# Patient Record
Sex: Male | Born: 1965 | Race: White | Hispanic: No | Marital: Single | Smoking: Current every day smoker
Health system: Southern US, Community
[De-identification: ages and names within clinical notes are randomized; demographics above are authoritative.]

## PROBLEM LIST (undated history)

## (undated) DIAGNOSIS — I2510 Atherosclerotic heart disease of native coronary artery without angina pectoris: Secondary | ICD-10-CM

## (undated) DIAGNOSIS — J45909 Unspecified asthma, uncomplicated: Secondary | ICD-10-CM

## (undated) DIAGNOSIS — K7030 Alcoholic cirrhosis of liver without ascites: Secondary | ICD-10-CM

## (undated) DIAGNOSIS — K449 Diaphragmatic hernia without obstruction or gangrene: Secondary | ICD-10-CM

## (undated) DIAGNOSIS — I10 Essential (primary) hypertension: Secondary | ICD-10-CM

## (undated) DIAGNOSIS — K2970 Gastritis, unspecified, without bleeding: Secondary | ICD-10-CM

## (undated) DIAGNOSIS — K635 Polyp of colon: Secondary | ICD-10-CM

## (undated) DIAGNOSIS — J449 Chronic obstructive pulmonary disease, unspecified: Secondary | ICD-10-CM

## (undated) DIAGNOSIS — S3991XA Unspecified injury of abdomen, initial encounter: Secondary | ICD-10-CM

## (undated) DIAGNOSIS — R768 Other specified abnormal immunological findings in serum: Secondary | ICD-10-CM

## (undated) DIAGNOSIS — Z9081 Acquired absence of spleen: Secondary | ICD-10-CM

## (undated) HISTORY — PX: TONSILLECTOMY: SUR1361

---
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
## 2014-04-15 ENCOUNTER — Emergency Department (HOSPITAL_COMMUNITY): Payer: Self-pay

## 2014-04-15 ENCOUNTER — Encounter (HOSPITAL_COMMUNITY): Payer: Self-pay | Admitting: Emergency Medicine

## 2014-04-15 ENCOUNTER — Inpatient Hospital Stay (HOSPITAL_COMMUNITY)
Admission: EM | Admit: 2014-04-15 | Discharge: 2014-04-22 | DRG: 375 | Disposition: A | Discharge status: Home Health | Payer: Self-pay | Attending: Internal Medicine | Admitting: Internal Medicine

## 2014-04-15 DIAGNOSIS — T380X5A Adverse effect of glucocorticoids and synthetic analogues, initial encounter: Secondary | ICD-10-CM | POA: Diagnosis present

## 2014-04-15 DIAGNOSIS — J441 Chronic obstructive pulmonary disease with (acute) exacerbation: Secondary | ICD-10-CM | POA: Diagnosis present

## 2014-04-15 DIAGNOSIS — C801 Malignant (primary) neoplasm, unspecified: Secondary | ICD-10-CM | POA: Diagnosis present

## 2014-04-15 DIAGNOSIS — K2990 Gastroduodenitis, unspecified, without bleeding: Secondary | ICD-10-CM

## 2014-04-15 DIAGNOSIS — F1020 Alcohol dependence, uncomplicated: Secondary | ICD-10-CM | POA: Diagnosis present

## 2014-04-15 DIAGNOSIS — Z880 Allergy status to penicillin: Secondary | ICD-10-CM

## 2014-04-15 DIAGNOSIS — Z800 Family history of malignant neoplasm of digestive organs: Secondary | ICD-10-CM

## 2014-04-15 DIAGNOSIS — R109 Unspecified abdominal pain: Secondary | ICD-10-CM

## 2014-04-15 DIAGNOSIS — K7030 Alcoholic cirrhosis of liver without ascites: Secondary | ICD-10-CM | POA: Diagnosis present

## 2014-04-15 DIAGNOSIS — K669 Disorder of peritoneum, unspecified: Secondary | ICD-10-CM | POA: Insufficient documentation

## 2014-04-15 DIAGNOSIS — R7309 Other abnormal glucose: Secondary | ICD-10-CM | POA: Diagnosis present

## 2014-04-15 DIAGNOSIS — K2970 Gastritis, unspecified, without bleeding: Secondary | ICD-10-CM | POA: Diagnosis present

## 2014-04-15 DIAGNOSIS — J449 Chronic obstructive pulmonary disease, unspecified: Secondary | ICD-10-CM

## 2014-04-15 DIAGNOSIS — R1013 Epigastric pain: Secondary | ICD-10-CM | POA: Diagnosis present

## 2014-04-15 DIAGNOSIS — F1011 Alcohol abuse, in remission: Secondary | ICD-10-CM | POA: Diagnosis present

## 2014-04-15 DIAGNOSIS — Z823 Family history of stroke: Secondary | ICD-10-CM

## 2014-04-15 DIAGNOSIS — D72829 Elevated white blood cell count, unspecified: Secondary | ICD-10-CM | POA: Diagnosis present

## 2014-04-15 DIAGNOSIS — Z720 Tobacco use: Secondary | ICD-10-CM | POA: Diagnosis present

## 2014-04-15 DIAGNOSIS — R7401 Elevation of levels of liver transaminase levels: Secondary | ICD-10-CM | POA: Diagnosis present

## 2014-04-15 DIAGNOSIS — K449 Diaphragmatic hernia without obstruction or gangrene: Secondary | ICD-10-CM | POA: Diagnosis present

## 2014-04-15 DIAGNOSIS — R7402 Elevation of levels of lactic acid dehydrogenase (LDH): Secondary | ICD-10-CM | POA: Diagnosis present

## 2014-04-15 DIAGNOSIS — K5900 Constipation, unspecified: Secondary | ICD-10-CM | POA: Diagnosis present

## 2014-04-15 DIAGNOSIS — Z83719 Family history of colon polyps, unspecified: Secondary | ICD-10-CM

## 2014-04-15 DIAGNOSIS — J45901 Unspecified asthma with (acute) exacerbation: Secondary | ICD-10-CM

## 2014-04-15 DIAGNOSIS — D126 Benign neoplasm of colon, unspecified: Secondary | ICD-10-CM | POA: Diagnosis present

## 2014-04-15 DIAGNOSIS — A048 Other specified bacterial intestinal infections: Secondary | ICD-10-CM | POA: Diagnosis present

## 2014-04-15 DIAGNOSIS — Z6841 Body Mass Index (BMI) 40.0 and over, adult: Secondary | ICD-10-CM

## 2014-04-15 DIAGNOSIS — Z8371 Family history of colonic polyps: Secondary | ICD-10-CM

## 2014-04-15 DIAGNOSIS — R1900 Intra-abdominal and pelvic swelling, mass and lump, unspecified site: Secondary | ICD-10-CM

## 2014-04-15 DIAGNOSIS — R768 Other specified abnormal immunological findings in serum: Secondary | ICD-10-CM | POA: Diagnosis present

## 2014-04-15 DIAGNOSIS — Z7982 Long term (current) use of aspirin: Secondary | ICD-10-CM

## 2014-04-15 DIAGNOSIS — F17200 Nicotine dependence, unspecified, uncomplicated: Secondary | ICD-10-CM | POA: Diagnosis present

## 2014-04-15 DIAGNOSIS — R945 Abnormal results of liver function studies: Secondary | ICD-10-CM

## 2014-04-15 DIAGNOSIS — B1920 Unspecified viral hepatitis C without hepatic coma: Secondary | ICD-10-CM | POA: Diagnosis present

## 2014-04-15 DIAGNOSIS — R740 Nonspecific elevation of levels of transaminase and lactic acid dehydrogenase [LDH]: Secondary | ICD-10-CM

## 2014-04-15 DIAGNOSIS — R7989 Other specified abnormal findings of blood chemistry: Secondary | ICD-10-CM | POA: Diagnosis present

## 2014-04-15 DIAGNOSIS — Z8249 Family history of ischemic heart disease and other diseases of the circulatory system: Secondary | ICD-10-CM

## 2014-04-15 DIAGNOSIS — Z9081 Acquired absence of spleen: Secondary | ICD-10-CM

## 2014-04-15 DIAGNOSIS — R1310 Dysphagia, unspecified: Secondary | ICD-10-CM | POA: Diagnosis present

## 2014-04-15 DIAGNOSIS — Z806 Family history of leukemia: Secondary | ICD-10-CM

## 2014-04-15 DIAGNOSIS — D45 Polycythemia vera: Secondary | ICD-10-CM | POA: Diagnosis present

## 2014-04-15 DIAGNOSIS — K3189 Other diseases of stomach and duodenum: Secondary | ICD-10-CM

## 2014-04-15 DIAGNOSIS — D7389 Other diseases of spleen: Secondary | ICD-10-CM | POA: Diagnosis present

## 2014-04-15 DIAGNOSIS — C786 Secondary malignant neoplasm of retroperitoneum and peritoneum: Principal | ICD-10-CM | POA: Diagnosis present

## 2014-04-15 DIAGNOSIS — K648 Other hemorrhoids: Secondary | ICD-10-CM | POA: Diagnosis present

## 2014-04-15 DIAGNOSIS — I2510 Atherosclerotic heart disease of native coronary artery without angina pectoris: Secondary | ICD-10-CM | POA: Diagnosis present

## 2014-04-15 DIAGNOSIS — K8020 Calculus of gallbladder without cholecystitis without obstruction: Secondary | ICD-10-CM | POA: Diagnosis present

## 2014-04-15 DIAGNOSIS — I10 Essential (primary) hypertension: Secondary | ICD-10-CM | POA: Diagnosis present

## 2014-04-15 DIAGNOSIS — K635 Polyp of colon: Secondary | ICD-10-CM | POA: Diagnosis present

## 2014-04-15 DIAGNOSIS — E669 Obesity, unspecified: Secondary | ICD-10-CM | POA: Diagnosis present

## 2014-04-15 HISTORY — DX: Atherosclerotic heart disease of native coronary artery without angina pectoris: I25.10

## 2014-04-15 HISTORY — DX: Essential (primary) hypertension: I10

## 2014-04-15 HISTORY — DX: Polyp of colon: K63.5

## 2014-04-15 HISTORY — DX: Other specified abnormal immunological findings in serum: R76.8

## 2014-04-15 HISTORY — DX: Unspecified injury of abdomen, initial encounter: S39.91XA

## 2014-04-15 HISTORY — DX: Chronic obstructive pulmonary disease, unspecified: J44.9

## 2014-04-15 HISTORY — DX: Alcoholic cirrhosis of liver without ascites: K70.30

## 2014-04-15 HISTORY — DX: Unspecified asthma, uncomplicated: J45.909

## 2014-04-15 HISTORY — DX: Diaphragmatic hernia without obstruction or gangrene: K44.9

## 2014-04-15 HISTORY — DX: Gastritis, unspecified, without bleeding: K29.70

## 2014-04-15 HISTORY — DX: Acquired absence of spleen: Z90.81

## 2014-04-15 LAB — CBC WITH DIFFERENTIAL/PLATELET
Basophils Absolute: 0.1 10*3/uL (ref 0.0–0.1)
Basophils Relative: 1 % (ref 0–1)
Eosinophils Absolute: 0.2 10*3/uL (ref 0.0–0.7)
Eosinophils Relative: 1 % (ref 0–5)
HEMATOCRIT: 50.7 % (ref 39.0–52.0)
HEMOGLOBIN: 17.4 g/dL — AB (ref 13.0–17.0)
LYMPHS ABS: 5.8 10*3/uL — AB (ref 0.7–4.0)
Lymphocytes Relative: 43 % (ref 12–46)
MCH: 33.1 pg (ref 26.0–34.0)
MCHC: 34.3 g/dL (ref 30.0–36.0)
MCV: 96.4 fL (ref 78.0–100.0)
MONOS PCT: 7 % (ref 3–12)
Monocytes Absolute: 0.9 10*3/uL (ref 0.1–1.0)
NEUTROS ABS: 6.4 10*3/uL (ref 1.7–7.7)
Neutrophils Relative %: 48 % (ref 43–77)
Platelets: 221 10*3/uL (ref 150–400)
RBC: 5.26 MIL/uL (ref 4.22–5.81)
RDW: 14.7 % (ref 11.5–15.5)
WBC: 13.3 10*3/uL — ABNORMAL HIGH (ref 4.0–10.5)

## 2014-04-15 LAB — COMPREHENSIVE METABOLIC PANEL
ALT: 143 U/L — ABNORMAL HIGH (ref 0–53)
AST: 119 U/L — AB (ref 0–37)
Albumin: 3.7 g/dL (ref 3.5–5.2)
Alkaline Phosphatase: 65 U/L (ref 39–117)
Anion gap: 13 (ref 5–15)
BILIRUBIN TOTAL: 0.3 mg/dL (ref 0.3–1.2)
BUN: 11 mg/dL (ref 6–23)
CHLORIDE: 105 meq/L (ref 96–112)
CO2: 22 meq/L (ref 19–32)
Calcium: 9 mg/dL (ref 8.4–10.5)
Creatinine, Ser: 0.89 mg/dL (ref 0.50–1.35)
GFR calc Af Amer: >90 mL/min (ref >90)
GLUCOSE: 122 mg/dL — AB (ref 70–99)
Potassium: 4.3 mEq/L (ref 3.7–5.3)
Sodium: 140 mEq/L (ref 137–147)
Total Protein: 7.3 g/dL (ref 6.0–8.3)

## 2014-04-15 LAB — CBC
HCT: 49 % (ref 39.0–52.0)
Hemoglobin: 16.6 g/dL (ref 13.0–17.0)
MCH: 32.5 pg (ref 26.0–34.0)
MCHC: 33.9 g/dL (ref 30.0–36.0)
MCV: 95.9 fL (ref 78.0–100.0)
Platelets: 227 10*3/uL (ref 150–400)
RBC: 5.11 MIL/uL (ref 4.22–5.81)
RDW: 14.7 % (ref 11.5–15.5)
WBC: 14.5 10*3/uL — ABNORMAL HIGH (ref 4.0–10.5)

## 2014-04-15 LAB — URINALYSIS, ROUTINE W REFLEX MICROSCOPIC
BILIRUBIN URINE: NEGATIVE
Glucose, UA: NEGATIVE mg/dL
HGB URINE DIPSTICK: NEGATIVE
Ketones, ur: NEGATIVE mg/dL
Leukocytes, UA: NEGATIVE
Nitrite: NEGATIVE
Protein, ur: NEGATIVE mg/dL
SPECIFIC GRAVITY, URINE: 1.015 (ref 1.005–1.030)
Urobilinogen, UA: 0.2 mg/dL (ref 0.0–1.0)
pH: 6 (ref 5.0–8.0)

## 2014-04-15 LAB — CREATININE, SERUM
Creatinine, Ser: 0.87 mg/dL (ref 0.50–1.35)
GFR calc Af Amer: >90 mL/min (ref >90)
GFR calc non Af Amer: >90 mL/min (ref >90)

## 2014-04-15 LAB — TROPONIN I: Troponin I: <0.3 ng/mL (ref <0.30)

## 2014-04-15 LAB — CBG MONITORING, ED: Glucose-Capillary: 135 mg/dL — ABNORMAL HIGH (ref 70–99)

## 2014-04-15 LAB — LIPASE, BLOOD: LIPASE: 72 U/L — AB (ref 11–59)

## 2014-04-15 MED ORDER — ASPIRIN EC 325 MG PO TBEC
325.0000 mg | DELAYED_RELEASE_TABLET | Freq: Every day | ORAL | Status: DC
  Administered 2014-04-16: 325 mg via ORAL
  Filled 2014-04-15: qty 1

## 2014-04-15 MED ORDER — ONDANSETRON HCL 4 MG PO TABS: 4.0000 mg | ORAL_TABLET | Freq: Four times a day (QID) | ORAL | Status: DC | PRN

## 2014-04-15 MED ORDER — POLYETHYLENE GLYCOL 3350 17 G PO PACK
17.0000 g | PACK | Freq: Two times a day (BID) | ORAL | Status: DC
  Administered 2014-04-15 – 2014-04-22 (×12): 17 g via ORAL
  Filled 2014-04-15 (×13): qty 1

## 2014-04-15 MED ORDER — LORAZEPAM 2 MG/ML IJ SOLN
1.0000 mg | Freq: Once | INTRAMUSCULAR | Status: AC
  Administered 2014-04-15: 1 mg via INTRAVENOUS
  Filled 2014-04-15: qty 1

## 2014-04-15 MED ORDER — ONDANSETRON HCL 4 MG/2ML IJ SOLN
4.0000 mg | Freq: Four times a day (QID) | INTRAMUSCULAR | Status: DC | PRN
  Administered 2014-04-15 – 2014-04-17 (×6): 4 mg via INTRAVENOUS
  Filled 2014-04-15 (×6): qty 2

## 2014-04-15 MED ORDER — HYDROMORPHONE HCL 1 MG/ML IJ SOLN
1.0000 mg | Freq: Once | INTRAMUSCULAR | Status: AC
  Administered 2014-04-15: 1 mg via INTRAVENOUS
  Filled 2014-04-15: qty 1

## 2014-04-15 MED ORDER — IPRATROPIUM-ALBUTEROL 0.5-2.5 (3) MG/3ML IN SOLN
3.0000 mL | RESPIRATORY_TRACT | Status: DC | PRN
Start: 2014-04-15 — End: 2014-04-16
  Administered 2014-04-16 (×2): 3 mL via RESPIRATORY_TRACT
  Filled 2014-04-15 (×2): qty 3

## 2014-04-15 MED ORDER — ONDANSETRON HCL 4 MG/2ML IJ SOLN
4.0000 mg | Freq: Once | INTRAMUSCULAR | Status: AC
  Administered 2014-04-15: 4 mg via INTRAVENOUS
  Filled 2014-04-15: qty 2

## 2014-04-15 MED ORDER — ASPIRIN EC 325 MG PO TBEC: 325.0000 mg | DELAYED_RELEASE_TABLET | Freq: Every day | ORAL | Status: DC

## 2014-04-15 MED ORDER — IOHEXOL 300 MG/ML  SOLN
50.0000 mL | Freq: Once | INTRAMUSCULAR | Status: AC | PRN
  Administered 2014-04-15: 50 mL via ORAL

## 2014-04-15 MED ORDER — ACETAMINOPHEN 325 MG PO TABS
650.0000 mg | ORAL_TABLET | Freq: Four times a day (QID) | ORAL | Status: DC | PRN
  Administered 2014-04-16 – 2014-04-18 (×2): 650 mg via ORAL
  Filled 2014-04-15 (×2): qty 2

## 2014-04-15 MED ORDER — MORPHINE SULFATE 4 MG/ML IJ SOLN
6.0000 mg | Freq: Once | INTRAMUSCULAR | Status: AC
  Administered 2014-04-15: 6 mg via INTRAVENOUS
  Filled 2014-04-15: qty 2

## 2014-04-15 MED ORDER — MORPHINE SULFATE 4 MG/ML IJ SOLN
4.0000 mg | INTRAMUSCULAR | Status: DC | PRN
  Administered 2014-04-15 – 2014-04-16 (×3): 4 mg via INTRAVENOUS
  Filled 2014-04-15 (×3): qty 1

## 2014-04-15 MED ORDER — HEPARIN SODIUM (PORCINE) 5000 UNIT/ML IJ SOLN
5000.0000 [IU] | Freq: Three times a day (TID) | INTRAMUSCULAR | Status: DC
  Administered 2014-04-15 – 2014-04-18 (×6): 5000 [IU] via SUBCUTANEOUS
  Filled 2014-04-15 (×8): qty 1

## 2014-04-15 MED ORDER — SENNA 8.6 MG PO TABS
1.0000 | ORAL_TABLET | Freq: Two times a day (BID) | ORAL | Status: DC
  Administered 2014-04-15: 8.6 mg via ORAL
  Administered 2014-04-16: 17.2 mg via ORAL
  Administered 2014-04-16: 8.6 mg via ORAL
  Filled 2014-04-15 (×2): qty 2
  Filled 2014-04-15 (×2): qty 1
  Filled 2014-04-15 (×3): qty 2

## 2014-04-15 MED ORDER — SODIUM CHLORIDE 0.9 % IV SOLN: INTRAVENOUS | Status: DC

## 2014-04-15 MED ORDER — GLYCERIN (LAXATIVE) 2.1 G RE SUPP
1.0000 | Freq: Once | RECTAL | Status: AC
  Administered 2014-04-16: 01:00:00 via RECTAL
  Filled 2014-04-15: qty 1

## 2014-04-15 MED ORDER — SODIUM CHLORIDE 0.9 % IV SOLN: 1000.0000 mL | INTRAVENOUS | Status: DC

## 2014-04-15 MED ORDER — ACETAMINOPHEN 650 MG RE SUPP: 650.0000 mg | Freq: Four times a day (QID) | RECTAL | Status: DC | PRN

## 2014-04-15 MED ORDER — METHYLPREDNISOLONE SODIUM SUCC 125 MG IJ SOLR
60.0000 mg | Freq: Four times a day (QID) | INTRAMUSCULAR | Status: DC
  Administered 2014-04-15 – 2014-04-16 (×3): 60 mg via INTRAVENOUS
  Filled 2014-04-15 (×3): qty 2

## 2014-04-15 MED ORDER — IOHEXOL 300 MG/ML  SOLN
100.0000 mL | Freq: Once | INTRAMUSCULAR | Status: AC | PRN
  Administered 2014-04-15: 100 mL via INTRAVENOUS

## 2014-04-15 MED ORDER — SODIUM CHLORIDE 0.9 % IV SOLN
1000.0000 mL | Freq: Once | INTRAVENOUS | Status: AC
  Administered 2014-04-15: 1000 mL via INTRAVENOUS

## 2014-04-15 MED ORDER — HYDROCHLOROTHIAZIDE 25 MG PO TABS
25.0000 mg | ORAL_TABLET | Freq: Every day | ORAL | Status: DC
  Administered 2014-04-16: 25 mg via ORAL
  Filled 2014-04-15 (×3): qty 1

## 2014-04-15 NOTE — ED Provider Notes (Signed)
CSN: [id number]     Arrival date & time 04/15/14  1619 History   First MD Initiated Contact with Patient 04/15/14 1641   This chart was scribed for Hoy Morn, MD by Rosary Lively, ED scribe. This patient was seen in room APA14/[facility]14 and the patient's care was started at 5:55 PM.    Chief Complaint  Patient presents with  . Chest Pain  . Emesis   The history is provided by the patient. No language interpreter was used.   HPI Comments:  Jeremy Mcgee is a 48 y.o. male who presents to the Emergency Department complaining of sharp right side  pain, onset 2-3 weeks ago. Pt reports that he has also experienced associated symptoms of SOB, nausea, vomiting, diarrhea, and dysuria. Relative reports that his BP often remains high. 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  Pt reports that he has delayed visiting ED, due to insurance.   Past Medical History  Diagnosis Date  . Hypertension   . Coronary artery disease   . Asthma   . COPD (chronic obstructive pulmonary disease)     emphysema   Past Surgical History  Procedure Laterality Date  . Splenectomy  1974  . Tonsillectomy     History reviewed. No pertinent family history. History  Substance Use Topics  . Smoking status: Current Every Day Smoker -- 1.00 packs/day    Types: Cigarettes  . Smokeless tobacco: Not on file  . Alcohol Use: No    Review of Systems  Gastrointestinal: Positive for nausea, vomiting, abdominal pain (RUQ) and diarrhea.  Genitourinary: Positive for dysuria.  All other systems reviewed and are negative.     Allergies  Penicillins  Home Medications   Prior to Admission medications   Not on File   BP 169/110  Pulse 80  Temp(Src) 99.1 F (37.3 C) (Oral)  Resp 20  Ht 5\' 7"  (1.702 m)  Wt 280 lb (127.007 kg)  BMI 43.84 kg/m2  SpO2 98% Physical Exam  Nursing note and vitals reviewed. Constitutional: He is oriented to person, place, and time. He appears well-developed and well-nourished.   HENT:  Head: Normocephalic and atraumatic.  Eyes: EOM are normal.  Neck: Normal range of motion.  Cardiovascular: Normal rate, regular rhythm, normal heart sounds and intact distal pulses.   Pulmonary/Chest: Effort normal and breath sounds normal. No respiratory distress.  Abdominal: Soft. He exhibits no distension. There is tenderness in the right upper quadrant.  Musculoskeletal: Normal range of motion.  Neurological: He is alert and oriented to person, place, and time.  Skin: Skin is warm and dry.  Psychiatric: He has a normal mood and affect. Judgment normal.    ED Course  Procedures  DIAGNOSTIC STUDIES: Oxygen Saturation is 98% on RA, normal by my interpretation.  COORDINATION OF CARE: 5:59 PM-Discussed treatment plan with pt at bedside and pt agreed to plan.  Labs Review Labs Reviewed  CBC WITH DIFFERENTIAL - Abnormal; Notable for the following:    WBC 13.3 (*)    Hemoglobin 17.4 (*)    Lymphs Abs 5.8 (*)    All other components within normal limits  COMPREHENSIVE METABOLIC PANEL - Abnormal; Notable for the following:    Glucose, Bld 122 (*)    AST 119 (*)    ALT 143 (*)    All other components within normal limits  LIPASE, BLOOD - Abnormal; Notable for the following:    Lipase 72 (*)    All other components within normal  limits  CBG MONITORING, ED - Abnormal; Notable for the following:    Glucose-Capillary 135 (*)    All other components within normal limits  TROPONIN I  URINALYSIS, ROUTINE W REFLEX MICROSCOPIC  HEMOGLOBIN A1C    Imaging Review Dg Chest 2 View  04/15/2014   CLINICAL DATA:  Cough, congestion, weakness  EXAM: CHEST  2 VIEW  COMPARISON:  None.  FINDINGS: The heart size and mediastinal contours are within normal limits. Both lungs are clear. The visualized skeletal structures are unremarkable.  IMPRESSION: No active cardiopulmonary disease.   Electronically Signed   By: Kathreen Devoid   On: 04/15/2014 17:11   Ct Abdomen Pelvis W  Contrast  04/15/2014   CLINICAL DATA:  Hervey Ard right-sided abdominal pain x 2-3 weeks, nausea/ vomiting/ diarrhea, dysuria. Prior splenectomy.  EXAM: CT ABDOMEN AND PELVIS WITH CONTRAST  TECHNIQUE: Multidetector CT imaging of the abdomen and pelvis was performed using the standard protocol following bolus administration of intravenous contrast.  CONTRAST:  70mL OMNIPAQUE IOHEXOL 300 MG/ML SOLN, 157mL OMNIPAQUE IOHEXOL 300 MG/ML SOLN  COMPARISON:  None.  FINDINGS: Lower chest:  Lung bases are clear.  Hepatobiliary: Mild prominence of the left hepatic lobe/ caudate, nonspecific but worrisome for cirrhosis. No focal hepatic lesions.  Possible tiny layering gallstone (series 2/image 30). No associated inflammatory changes. No intrahepatic or extrahepatic ductal dilatation.  Spleen: Surgically absent. 2.7 x 2.1 cm rounded lesion in the left upper abdomen may reflect residual splenic tissue (series 2/ image 10). Additional adjacent possible residual splenic tissue (series 2/image 11).  Pancreas: Within normal limits.  Stomach/Bowel: Mildly prominent soft tissue in the gastric cardia at the GE junction (series 2/image 15), possibly simply reflecting prominent gastric folds but persistent on delayed imaging.  No evidence of bowel obstruction.  Normal appendix.  No definite colonic wall thickening or mass is seen.  Adrenals/urinary tract: Adrenal glands are unremarkable.  Kidneys are within normal limits.  No hydronephrosis.  Vascular/Lymphatic: Atherosclerotic calcifications of the abdominal aorta and branch vessels.  Small upper abdominal lymph nodes, including a 16 mm short axis portacaval node (series 2/image 28), and a 12 mm gastrohepatic node (series 2/ image 16), indeterminate in the setting of suspected cirrhosis.  Additional scattered small retroperitoneal nodes which do not meet pathologic CT size criteria but are increased in number.  Small retrocrural nodes measuring up to 8 mm short axis (series 2/image 11).   Reproductive: Prostate is unremarkable.  Musculoskeletal: Degenerative changes of the visualized thoracolumbar spine.  Other: No abdominopelvic ascites.  Scattered omental implants, measuring up to 13 mm cyst in the anterior left upper abdomen (series 2/image 24).  5.7 x 4.0 cm peritoneal implant in the lower pelvis (series 2/ image 72).  IMPRESSION: Peritoneal/omental disease, including a dominant 5.7 cm pelvic implant. Underlying primary malignancy is unclear from the current CT, although a nonvisualized GI primary would be the leading concern.  Possible mass at the gastric cardia/ GE junction, equivocal. Consider upper endoscopy for further evaluation. If negative, colonoscopy is suggested.  Scattered small retrocrural, upper abdominal, and retroperitoneal nodes, as above.  Suspected cirrhosis. Cholelithiasis. Prior splenectomy with suspected residual splenic tissue in the left upper abdomen.   Electronically Signed   By: Julian Hy M.D.   On: 04/15/2014 18:54  I personally reviewed the imaging tests through PACS system I reviewed available ER/hospitalization records through the EMR    EKG Interpretation   Date/Time:  Friday April 15 2014 16:32:45 EDT Ventricular Rate:  80 PR Interval:  140 QRS Duration: 84 QT Interval:  375 QTC Calculation: 433 R Axis:   59 Text Interpretation:  Sinus rhythm No old tracing to compare Confirmed by  Bode Pieper  MD, Lennette Bihari ([id number]) on 04/15/2014 4:52:45 PM      MDM   Final diagnoses:  Peritoneal disease  Abdominal pain, unspecified abdominal location   CT scan concerning with peritoneal implants concerning for peritoneal carcinomatosis.  Unclear underlying etiology.  Patient's brother died of colon cancer at age 81.  Patient is a radical endoscopy.  Patient with ongoing abdominal pain nausea vomiting.  Admit to hospital person control.  Additional cancer workup will need to be had including a likely endoscopy and questionable colonoscopy.  Questionable  gastric mass  I personally performed the services described in this documentation, which was scribed in my presence. The recorded information has been reviewed and is accurate.       Hoy Morn, MD 04/15/14 [phone]

## 2014-04-15 NOTE — H&P (Signed)
[facility] History and Physical  [name] GEX:[id number] DOB: 08-29-1965 DOA: 04/15/2014  Referring physician: Dr. Venora Maples PCP: No PCP Per Patient  Specialists: GI  Chief Complaint: RUQ pain  Assessment/Plan Abdominal pain Peritoneal Carcinomatosis HTN Ashtma/COPD CAD Tobacco abuse  Constipation Leukocytosis  Abdominal pain: likely multifactorial secondary to peritoneal carcinomatosis, cirrhosis, and constipation. Possible MSK component. AST/ALT  119/143 on admission. Alkphos nml. CT scan w/ possible gastric mass w/ peritoneal/omental carcinomatosis, cirrhosis, and cholelithiasis. Last colonoscopy ~20 years ago and nml. Brother w/ familial polyposis and colon cancer. Lipase 72 but no pain from oral intake.  - Admit to [facility] - Consult GI for likely endoscopic studies - Consult Onc  - Morphine PRN pain - CEA, CA19-9 - Aggressive Senna, Miralax - Glylcerine suppository x1  - consider enema if no BM on 9/19. - IV zofran PRN  Asthma/COPD: asthma exacerbation. No O2 requirement but slight increase in WOB and wheezing on exam. No cough, fever. CXR w/o PNA - Solumedrol 60mg  IV Q6 - Duoneb Q4prn  HTN: hypertensive on admission SBP 149-167. Taking BP medications sporadically from family members. Pt will likely need multidrug regimen. Renal fxn nml - Start HCTZ 25mg  - con home ASA 325 (h/o CAD w/o mention of cath)  Leukocytosis/polycythemia: likely related to underlying malignancy vs dehydration. WBC 13.3. Hgb 17.4. Afebrile. - CBC in am - NS 152ml/hr (1L NS bolus in ED x 2)  Tobacco abuse: gave up smoking today. Pt w/ 30+ pack years. Previously smoking 1ppd - Nicotine patch 21mg  Qday  DVT Prophylaxis: Hep Dunlap TID  Code Status: FULL Family Communication: Wife not present for admission. As she had recently left to go home.  Disposition Plan: Pending further improvement and Dx workup   HPI: Hadyn Mcgee is a 48 y.o. male came to [facility] ed 04/15/2014 with  2-3 wks  Abd pain. RUQ predominantly. Sharp. Comes and goes. Getting worse. Tylenol and advil w/o benefit. BMs (small and hard yesterday adn today after taking 2 laxatives). Constipated for past month, typically daily soft BM. Has gone 5-7 days w/o BM recently. Endorses hot and cold chills and general body aches. Denies SOB, fevers, dysuria, frequency, CP, lightheadedness, palpitations, nausea or vomiting. Tolerating po w/o pain. Pain is worse laying on r side. Improves w/ certain positions while laying.   Review of Systems: Per HPI w/ all other systems negative.   Past Medical History  Diagnosis Date  . Hypertension   . Coronary artery disease   . Asthma   . COPD (chronic obstructive pulmonary disease)     emphysema  . Abdominal trauma    Past Surgical History  Procedure Laterality Date  . Splenectomy  1974  . Tonsillectomy     Social History:  History   Social History Narrative  . No narrative on file    Allergies  Allergen Reactions  . Penicillins Hives    Family History  Problem Relation Age of Onset  . Cancer Maternal Grandmother     lung  . Leukemia Maternal Uncle   . Colon cancer Brother     Familial polyposis  . Stomach cancer Maternal Aunt   . Hypertension Maternal Grandfather   . Heart attack Brother 3  . Stroke Brother 51     Prior to Admission medications   Medication Sig Start Date End Date Taking? Authorizing Provider  acetaminophen (TYLENOL) 500 MG tablet Take 500 mg by mouth every 6 (six) hours as needed for mild pain or moderate pain.   Yes  Historical Provider, MD  aspirin EC 325 MG tablet Take 325 mg by mouth daily.   Yes Historical Provider, MD  Cyanocobalamin (B-12 PO) Take 1 tablet by mouth daily.   Yes Historical Provider, MD  ibuprofen (ADVIL,MOTRIN) 200 MG tablet Take 200 mg by mouth every 6 (six) hours as needed for mild pain or moderate pain.   Yes Historical Provider, MD  Multiple Vitamin (MULTIVITAMIN WITH MINERALS) TABS tablet Take 1 tablet by  mouth daily.   Yes Historical Provider, MD   Physical Exam: Filed Vitals:   04/15/14 1633 04/15/14 1700 04/15/14 1730 04/15/14 1800  BP: 169/110 151/93 147/104 159/107  Pulse: 80 82 76 85  Temp: 99.1 F (37.3 C)     TempSrc: Oral     Resp: 20 19 12 20   Height: 5\' 7"  (1.702 m)     Weight: 127.007 kg (280 lb)     SpO2: 98% 97% 97% 98%     General:  Mild distress  Eyes: EOMI, PERRL  ENT: MMM  Neck: FROM  Cardiovascular: RRR, no m/r/g  Respiratory: Wheezing throughout. No crackles, ronchi. Mild increased WOB  Abdomen: MIld intermittent RUQ ttp. NABS obesity limiting exam  Skin: intact. No rashes.  Musculoskeletal: No LE edema. Moves all extremities spontaneously  Psychiatric: Affect nml  Neurologic: CN 2-12 grossly intact.   Labs on Admission:  Basic Metabolic Panel:  Recent Labs Lab 04/15/14 1640  NA 140  K 4.3  CL 105  CO2 22  GLUCOSE 122*  BUN 11  CREATININE 0.89  CALCIUM 9.0   Liver Function Tests:  Recent Labs Lab 04/15/14 1640  AST 119*  ALT 143*  ALKPHOS 65  BILITOT 0.3  PROT 7.3  ALBUMIN 3.7    Recent Labs Lab 04/15/14 1640  LIPASE 72*   No results found for this basename: AMMONIA,  in the last 168 hours CBC:  Recent Labs Lab 04/15/14 1640  WBC 13.3*  NEUTROABS 6.4  HGB 17.4*  HCT 50.7  MCV 96.4  PLT 221   Cardiac Enzymes:  Recent Labs Lab 04/15/14 1640  TROPONINI <0.30    BNP (last 3 results) No results found for this basename: PROBNP,  in the last 8760 hours CBG:  Recent Labs Lab 04/15/14 1644  GLUCAP 135*    Radiological Exams on Admission: Dg Chest 2 View  04/15/2014   CLINICAL DATA:  Cough, congestion, weakness  EXAM: CHEST  2 VIEW  COMPARISON:  None.  FINDINGS: The heart size and mediastinal contours are within normal limits. Both lungs are clear. The visualized skeletal structures are unremarkable.  IMPRESSION: No active cardiopulmonary disease.   Electronically Signed   By: Kathreen Devoid   On:  04/15/2014 17:11   Ct Abdomen Pelvis W Contrast  04/15/2014   CLINICAL DATA:  Hervey Ard right-sided abdominal pain x 2-3 weeks, nausea/ vomiting/ diarrhea, dysuria. Prior splenectomy.  EXAM: CT ABDOMEN AND PELVIS WITH CONTRAST  TECHNIQUE: Multidetector CT imaging of the abdomen and pelvis was performed using the standard protocol following bolus administration of intravenous contrast.  CONTRAST:  4mL OMNIPAQUE IOHEXOL 300 MG/ML SOLN, 156mL OMNIPAQUE IOHEXOL 300 MG/ML SOLN  COMPARISON:  None.  FINDINGS: Lower chest:  Lung bases are clear.  Hepatobiliary: Mild prominence of the left hepatic lobe/ caudate, nonspecific but worrisome for cirrhosis. No focal hepatic lesions.  Possible tiny layering gallstone (series 2/image 30). No associated inflammatory changes. No intrahepatic or extrahepatic ductal dilatation.  Spleen: Surgically absent. 2.7 x 2.1 cm rounded lesion in the left upper  abdomen may reflect residual splenic tissue (series 2/ image 10). Additional adjacent possible residual splenic tissue (series 2/image 11).  Pancreas: Within normal limits.  Stomach/Bowel: Mildly prominent soft tissue in the gastric cardia at the GE junction (series 2/image 15), possibly simply reflecting prominent gastric folds but persistent on delayed imaging.  No evidence of bowel obstruction.  Normal appendix.  No definite colonic wall thickening or mass is seen.  Adrenals/urinary tract: Adrenal glands are unremarkable.  Kidneys are within normal limits.  No hydronephrosis.  Vascular/Lymphatic: Atherosclerotic calcifications of the abdominal aorta and branch vessels.  Small upper abdominal lymph nodes, including a 16 mm short axis portacaval node (series 2/image 28), and a 12 mm gastrohepatic node (series 2/ image 16), indeterminate in the setting of suspected cirrhosis.  Additional scattered small retroperitoneal nodes which do not meet pathologic CT size criteria but are increased in number.  Small retrocrural nodes measuring up to  8 mm short axis (series 2/image 11).  Reproductive: Prostate is unremarkable.  Musculoskeletal: Degenerative changes of the visualized thoracolumbar spine.  Other: No abdominopelvic ascites.  Scattered omental implants, measuring up to 13 mm cyst in the anterior left upper abdomen (series 2/image 24).  5.7 x 4.0 cm peritoneal implant in the lower pelvis (series 2/ image 72).  IMPRESSION: Peritoneal/omental disease, including a dominant 5.7 cm pelvic implant. Underlying primary malignancy is unclear from the current CT, although a nonvisualized GI primary would be the leading concern.  Possible mass at the gastric cardia/ GE junction, equivocal. Consider upper endoscopy for further evaluation. If negative, colonoscopy is suggested.  Scattered small retrocrural, upper abdominal, and retroperitoneal nodes, as above.  Suspected cirrhosis. Cholelithiasis. Prior splenectomy with suspected residual splenic tissue in the left upper abdomen.   Electronically Signed   By: Julian Hy M.D.   On: 04/15/2014 18:54       Time spent: >70 min in direct pt care and coordination   Mckinze Poirier J, MD [facility] [email or web address] Password [email or web address] 04/15/2014, 8:21 PM

## 2014-04-15 NOTE — ED Notes (Signed)
Pt. Requesting "something for my nerves". Dr. Venora Maples notified.

## 2014-04-15 NOTE — ED Notes (Addendum)
Patient has had 2 weeks of R chest pain, RUQ abdominal pain.  It became much more severe today around 1500.  Started vomiting last night. Pain is sharp, stabbing.  He has taken ibuprofen and tylenol w/out relief.  Nothing exacerbates pain.  He has had a fairly high fat diet.  Still has gallbladder, but has hx of HTN and "hardening of the arteries" in his heart. Patient also relates polyphagia, polydipsia and polyuria.  Patient is from out of town and does not have a PCP.

## 2014-04-16 ENCOUNTER — Encounter (HOSPITAL_COMMUNITY)
Admission: EM | Disposition: A | Discharge status: Home Health | Payer: Self-pay | Source: Home / Self Care | Attending: Internal Medicine

## 2014-04-16 ENCOUNTER — Encounter (HOSPITAL_COMMUNITY): Payer: Self-pay | Admitting: Internal Medicine

## 2014-04-16 DIAGNOSIS — E669 Obesity, unspecified: Secondary | ICD-10-CM | POA: Diagnosis present

## 2014-04-16 DIAGNOSIS — Z800 Family history of malignant neoplasm of digestive organs: Secondary | ICD-10-CM

## 2014-04-16 DIAGNOSIS — K319 Disease of stomach and duodenum, unspecified: Secondary | ICD-10-CM

## 2014-04-16 DIAGNOSIS — F1011 Alcohol abuse, in remission: Secondary | ICD-10-CM | POA: Diagnosis present

## 2014-04-16 DIAGNOSIS — Z720 Tobacco use: Secondary | ICD-10-CM | POA: Diagnosis present

## 2014-04-16 DIAGNOSIS — C786 Secondary malignant neoplasm of retroperitoneum and peritoneum: Secondary | ICD-10-CM | POA: Diagnosis present

## 2014-04-16 DIAGNOSIS — K5900 Constipation, unspecified: Secondary | ICD-10-CM | POA: Diagnosis present

## 2014-04-16 DIAGNOSIS — R7989 Other specified abnormal findings of blood chemistry: Secondary | ICD-10-CM

## 2014-04-16 DIAGNOSIS — Z9081 Acquired absence of spleen: Secondary | ICD-10-CM

## 2014-04-16 DIAGNOSIS — K8020 Calculus of gallbladder without cholecystitis without obstruction: Secondary | ICD-10-CM | POA: Diagnosis present

## 2014-04-16 DIAGNOSIS — I2510 Atherosclerotic heart disease of native coronary artery without angina pectoris: Secondary | ICD-10-CM | POA: Diagnosis present

## 2014-04-16 DIAGNOSIS — R1013 Epigastric pain: Secondary | ICD-10-CM | POA: Diagnosis present

## 2014-04-16 DIAGNOSIS — J441 Chronic obstructive pulmonary disease with (acute) exacerbation: Secondary | ICD-10-CM | POA: Diagnosis present

## 2014-04-16 DIAGNOSIS — C801 Malignant (primary) neoplasm, unspecified: Secondary | ICD-10-CM

## 2014-04-16 DIAGNOSIS — R945 Abnormal results of liver function studies: Secondary | ICD-10-CM

## 2014-04-16 HISTORY — PX: ESOPHAGOGASTRODUODENOSCOPY: SHX5428

## 2014-04-16 LAB — GLUCOSE, CAPILLARY
GLUCOSE-CAPILLARY: 203 mg/dL — AB (ref 70–99)
GLUCOSE-CAPILLARY: 161 mg/dL — AB (ref 70–99)
Glucose-Capillary: 149 mg/dL — ABNORMAL HIGH (ref 70–99)

## 2014-04-16 LAB — CBC
HCT: 50.5 % (ref 39.0–52.0)
HEMOGLOBIN: 16.7 g/dL (ref 13.0–17.0)
MCH: 32.5 pg (ref 26.0–34.0)
MCHC: 33.1 g/dL (ref 30.0–36.0)
MCV: 98.2 fL (ref 78.0–100.0)
Platelets: 225 10*3/uL (ref 150–400)
RBC: 5.14 MIL/uL (ref 4.22–5.81)
RDW: 14.9 % (ref 11.5–15.5)
WBC: 7.7 10*3/uL (ref 4.0–10.5)

## 2014-04-16 LAB — COMPREHENSIVE METABOLIC PANEL
ALK PHOS: 57 U/L (ref 39–117)
ALT: 144 U/L — ABNORMAL HIGH (ref 0–53)
AST: 106 U/L — ABNORMAL HIGH (ref 0–37)
Albumin: 3.5 g/dL (ref 3.5–5.2)
Anion gap: 11 (ref 5–15)
BUN: 10 mg/dL (ref 6–23)
CO2: 24 meq/L (ref 19–32)
Calcium: 8.6 mg/dL (ref 8.4–10.5)
Chloride: 103 mEq/L (ref 96–112)
Creatinine, Ser: 0.83 mg/dL (ref 0.50–1.35)
GLUCOSE: 149 mg/dL — AB (ref 70–99)
POTASSIUM: 4.3 meq/L (ref 3.7–5.3)
Sodium: 138 mEq/L (ref 137–147)
Total Bilirubin: 0.3 mg/dL (ref 0.3–1.2)
Total Protein: 7.1 g/dL (ref 6.0–8.3)

## 2014-04-16 LAB — HEMOGLOBIN A1C
Hgb A1c MFr Bld: 5.8 % — ABNORMAL HIGH (ref <5.7)
Mean Plasma Glucose: 120 mg/dL — ABNORMAL HIGH (ref <117)

## 2014-04-16 SURGERY — EGD (ESOPHAGOGASTRODUODENOSCOPY)
Anesthesia: Moderate Sedation

## 2014-04-16 MED ORDER — PANTOPRAZOLE SODIUM 40 MG IV SOLR
40.0000 mg | Freq: Every day | INTRAVENOUS | Status: DC
  Administered 2014-04-16: 40 mg via INTRAVENOUS
  Filled 2014-04-16: qty 40

## 2014-04-16 MED ORDER — MIDAZOLAM HCL 5 MG/5ML IJ SOLN
INTRAMUSCULAR | Status: DC | PRN
Start: 2014-04-16 — End: 2014-04-16
  Administered 2014-04-16: 1 mg via INTRAVENOUS
  Administered 2014-04-16 (×2): 2 mg via INTRAVENOUS

## 2014-04-16 MED ORDER — SODIUM CHLORIDE 0.9 % IJ SOLN
INTRAMUSCULAR | Status: AC
  Filled 2014-04-16: qty 10

## 2014-04-16 MED ORDER — SIMETHICONE 40 MG/0.6ML PO SUSP
ORAL | Status: DC | PRN
  Administered 2014-04-16: 13:00:00

## 2014-04-16 MED ORDER — OXAZEPAM 15 MG PO CAPS
15.0000 mg | ORAL_CAPSULE | Freq: Four times a day (QID) | ORAL | Status: DC | PRN
  Administered 2014-04-16 – 2014-04-19 (×11): 15 mg via ORAL
  Filled 2014-04-16 (×11): qty 1

## 2014-04-16 MED ORDER — IPRATROPIUM-ALBUTEROL 0.5-2.5 (3) MG/3ML IN SOLN
3.0000 mL | Freq: Four times a day (QID) | RESPIRATORY_TRACT | Status: DC
  Administered 2014-04-16 – 2014-04-17 (×5): 3 mL via RESPIRATORY_TRACT
  Filled 2014-04-16 (×5): qty 3

## 2014-04-16 MED ORDER — PROMETHAZINE HCL 25 MG/ML IJ SOLN
INTRAMUSCULAR | Status: DC | PRN
Start: 2014-04-16 — End: 2014-04-16
  Administered 2014-04-16: 12.5 mg via INTRAVENOUS

## 2014-04-16 MED ORDER — PROMETHAZINE HCL 25 MG/ML IJ SOLN
INTRAMUSCULAR | Status: AC
  Filled 2014-04-16: qty 1

## 2014-04-16 MED ORDER — LEVOFLOXACIN IN D5W 500 MG/100ML IV SOLN
500.0000 mg | INTRAVENOUS | Status: DC
  Administered 2014-04-16 – 2014-04-17 (×2): 500 mg via INTRAVENOUS
  Filled 2014-04-16 (×3): qty 100

## 2014-04-16 MED ORDER — BISACODYL 5 MG PO TBEC
10.0000 mg | DELAYED_RELEASE_TABLET | ORAL | Status: AC
  Administered 2014-04-16 (×2): 10 mg via ORAL
  Filled 2014-04-16 (×2): qty 2

## 2014-04-16 MED ORDER — INSULIN ASPART 100 UNIT/ML ~~LOC~~ SOLN
0.0000 [IU] | Freq: Three times a day (TID) | SUBCUTANEOUS | Status: DC
  Administered 2014-04-16: 5 [IU] via SUBCUTANEOUS
  Administered 2014-04-16: 2 [IU] via SUBCUTANEOUS
  Administered 2014-04-17: 3 [IU] via SUBCUTANEOUS
  Administered 2014-04-17: 2 [IU] via SUBCUTANEOUS
  Administered 2014-04-18: 3 [IU] via SUBCUTANEOUS
  Administered 2014-04-18 – 2014-04-21 (×7): 2 [IU] via SUBCUTANEOUS

## 2014-04-16 MED ORDER — METHYLPREDNISOLONE SODIUM SUCC 125 MG IJ SOLR
60.0000 mg | Freq: Three times a day (TID) | INTRAMUSCULAR | Status: DC
  Administered 2014-04-16 – 2014-04-18 (×5): 60 mg via INTRAVENOUS
  Filled 2014-04-16 (×6): qty 2

## 2014-04-16 MED ORDER — OXYCODONE HCL 5 MG PO TABS
5.0000 mg | ORAL_TABLET | ORAL | Status: DC | PRN
  Administered 2014-04-16 – 2014-04-18 (×7): 5 mg via ORAL
  Filled 2014-04-16 (×6): qty 1

## 2014-04-16 MED ORDER — NICOTINE 14 MG/24HR TD PT24
14.0000 mg | MEDICATED_PATCH | Freq: Every day | TRANSDERMAL | Status: DC
  Administered 2014-04-16 – 2014-04-22 (×7): 14 mg via TRANSDERMAL
  Filled 2014-04-16 (×7): qty 1

## 2014-04-16 MED ORDER — HYDROMORPHONE HCL 1 MG/ML IJ SOLN
1.0000 mg | INTRAMUSCULAR | Status: DC | PRN
  Administered 2014-04-16 – 2014-04-18 (×21): 1 mg via INTRAVENOUS
  Filled 2014-04-16 (×21): qty 1

## 2014-04-16 MED ORDER — MIDAZOLAM HCL 5 MG/5ML IJ SOLN
INTRAMUSCULAR | Status: AC
  Filled 2014-04-16: qty 5

## 2014-04-16 MED ORDER — SODIUM CHLORIDE 0.9 % IV SOLN
1000.0000 mL | INTRAVENOUS | Status: DC
  Administered 2014-04-16: 1000 mL via INTRAVENOUS

## 2014-04-16 MED ORDER — LIDOCAINE VISCOUS 2 % MT SOLN
OROMUCOSAL | Status: DC | PRN
  Administered 2014-04-16: 3 mL via OROMUCOSAL

## 2014-04-16 MED ORDER — INSULIN ASPART 100 UNIT/ML ~~LOC~~ SOLN: 0.0000 [IU] | Freq: Every day | SUBCUTANEOUS | Status: DC

## 2014-04-16 MED ORDER — FENTANYL CITRATE 0.05 MG/ML IJ SOLN
INTRAMUSCULAR | Status: DC | PRN
  Administered 2014-04-16 (×2): 25 ug via INTRAVENOUS
  Administered 2014-04-16: 50 ug via INTRAVENOUS

## 2014-04-16 MED ORDER — LISINOPRIL 5 MG PO TABS
2.5000 mg | ORAL_TABLET | Freq: Every day | ORAL | Status: DC
  Administered 2014-04-16 – 2014-04-17 (×2): 2.5 mg via ORAL
  Filled 2014-04-16 (×3): qty 1

## 2014-04-16 MED ORDER — LIDOCAINE VISCOUS 2 % MT SOLN
OROMUCOSAL | Status: AC
  Filled 2014-04-16: qty 15

## 2014-04-16 MED ORDER — HYDROMORPHONE HCL 1 MG/ML IJ SOLN
1.0000 mg | INTRAMUSCULAR | Status: DC | PRN
  Administered 2014-04-16: 1 mg via INTRAVENOUS
  Filled 2014-04-16: qty 1

## 2014-04-16 MED ORDER — PEG 3350-KCL-NA BICARB-NACL 420 G PO SOLR
4000.0000 mL | Freq: Once | ORAL | Status: AC
  Administered 2014-04-16: 4000 mL via ORAL
  Filled 2014-04-16: qty 4000

## 2014-04-16 MED ORDER — FENTANYL CITRATE 0.05 MG/ML IJ SOLN
INTRAMUSCULAR | Status: AC
  Filled 2014-04-16: qty 4

## 2014-04-16 MED ORDER — IPRATROPIUM-ALBUTEROL 0.5-2.5 (3) MG/3ML IN SOLN
3.0000 mL | Freq: Four times a day (QID) | RESPIRATORY_TRACT | Status: DC
  Administered 2014-04-16: 3 mL via RESPIRATORY_TRACT
  Filled 2014-04-16: qty 3

## 2014-04-16 MED ORDER — PANTOPRAZOLE SODIUM 40 MG PO TBEC
40.0000 mg | DELAYED_RELEASE_TABLET | Freq: Two times a day (BID) | ORAL | Status: DC
  Administered 2014-04-16 – 2014-04-22 (×12): 40 mg via ORAL
  Filled 2014-04-16 (×12): qty 1

## 2014-04-16 NOTE — Progress Notes (Signed)
[facility] PROGRESS NOTE  Jeremy Mcgee HWE:[id number] DOB: 01/05/1966 DOA: 04/15/2014 PCP: No PCP Per Patient    Code Status: Full code Family Communication: Discussed with patient, family not available. Disposition Plan: Discharge when clinically appropriate.   Consultants:  Gastroenterology, pending.  Procedures:  None so far.  Antibiotics:  Starting Levaquin on 9/19  HPI/Subjective: The patient is complaining of 10 over 10 abdominal pain, mostly in the epigastrium and then radiating to both sides. He denies nausea vomiting currently. He complains of constipation and has not had a good bowel movement in over one week.  Objective: Filed Vitals:   04/16/14 0417  BP: 106/61  Pulse: 74  Temp: 98.1 F (36.7 C)  Resp: 18   No intake or output data in the 24 hours ending 04/16/14 1106 Filed Weights   04/15/14 1633 04/15/14 2158  Weight: 127.007 kg (280 lb) 123.2 kg (271 lb 9.7 oz)    Exam:   General: Ill-appearing obese [name] in some distress from pain.  Cardiovascular: S1, S2, with a soft systolic murmur.  Respiratory: Occasional wheezes and crackles auscultated bilaterally. Breathing nonlabored.  Abdomen: Obese, positive bowel sounds, moderately tender in the epigastrium and the hypogastrium; no appreciable distention or masses palpated.  Musculoskeletal: No acute hot joints. Pedal pulses palpable. Trace of pedal edema.  Neuropsychiatric: He is alert and oriented x3. He is tearful about the lack of health insurance and unemployment. Cranial nerves II through XII are grossly intact.   Data Reviewed: Basic Metabolic Panel:  Recent Labs Lab 04/15/14 1640 04/15/14 2133 04/16/14 0549  NA 140  --  138  K 4.3  --  4.3  CL 105  --  103  CO2 22  --  24  GLUCOSE 122*  --  149*  BUN 11  --  10  CREATININE 0.89 0.87 0.83  CALCIUM 9.0  --  8.6   Liver Function Tests:  Recent Labs Lab 04/15/14 1640 04/16/14 0549  AST 119* 106*   ALT 143* 144*  ALKPHOS 65 57  BILITOT 0.3 0.3  PROT 7.3 7.1  ALBUMIN 3.7 3.5    Recent Labs Lab 04/15/14 1640  LIPASE 72*   No results found for this basename: AMMONIA,  in the last 168 hours CBC:  Recent Labs Lab 04/15/14 1640 04/15/14 2133 04/16/14 0549  WBC 13.3* 14.5* 7.7  NEUTROABS 6.4  --   --   HGB 17.4* 16.6 16.7  HCT 50.7 49.0 50.5  MCV 96.4 95.9 98.2  PLT 221 227 225   Cardiac Enzymes:  Recent Labs Lab 04/15/14 1640  TROPONINI <0.30   BNP (last 3 results) No results found for this basename: PROBNP,  in the last 8760 hours CBG:  Recent Labs Lab 04/15/14 [place] 135*    No results found for this or any previous visit (from the past 240 hour(s)).   Studies: Dg Chest 2 View  04/15/2014   CLINICAL DATA:  Cough, congestion, weakness  EXAM: CHEST  2 VIEW  COMPARISON:  None.  FINDINGS: The heart size and mediastinal contours are within normal limits. Both lungs are clear. The visualized skeletal structures are unremarkable.  IMPRESSION: No active cardiopulmonary disease.   Electronically Signed   By: Kathreen Devoid   On: 04/15/2014 17:11   Ct Abdomen Pelvis W Contrast  04/15/2014   CLINICAL DATA:  Hervey Ard right-sided abdominal pain x 2-3 weeks, nausea/ vomiting/ diarrhea, dysuria. Prior splenectomy.  EXAM: CT ABDOMEN AND PELVIS WITH CONTRAST  TECHNIQUE: Multidetector  CT imaging of the abdomen and pelvis was performed using the standard protocol following bolus administration of intravenous contrast.  CONTRAST:  62mL OMNIPAQUE IOHEXOL 300 MG/ML SOLN, 147mL OMNIPAQUE IOHEXOL 300 MG/ML SOLN  COMPARISON:  None.  FINDINGS: Lower chest:  Lung bases are clear.  Hepatobiliary: Mild prominence of the left hepatic lobe/ caudate, nonspecific but worrisome for cirrhosis. No focal hepatic lesions.  Possible tiny layering gallstone (series 2/image 30). No associated inflammatory changes. No intrahepatic or extrahepatic ductal dilatation.  Spleen: Surgically absent. 2.7 x 2.1  cm rounded lesion in the left upper abdomen may reflect residual splenic tissue (series 2/ image 10). Additional adjacent possible residual splenic tissue (series 2/image 11).  Pancreas: Within normal limits.  Stomach/Bowel: Mildly prominent soft tissue in the gastric cardia at the GE junction (series 2/image 15), possibly simply reflecting prominent gastric folds but persistent on delayed imaging.  No evidence of bowel obstruction.  Normal appendix.  No definite colonic wall thickening or mass is seen.  Adrenals/urinary tract: Adrenal glands are unremarkable.  Kidneys are within normal limits.  No hydronephrosis.  Vascular/Lymphatic: Atherosclerotic calcifications of the abdominal aorta and branch vessels.  Small upper abdominal lymph nodes, including a 16 mm short axis portacaval node (series 2/image 28), and a 12 mm gastrohepatic node (series 2/ image 16), indeterminate in the setting of suspected cirrhosis.  Additional scattered small retroperitoneal nodes which do not meet pathologic CT size criteria but are increased in number.  Small retrocrural nodes measuring up to 8 mm short axis (series 2/image 11).  Reproductive: Prostate is unremarkable.  Musculoskeletal: Degenerative changes of the visualized thoracolumbar spine.  Other: No abdominopelvic ascites.  Scattered omental implants, measuring up to 13 mm cyst in the anterior left upper abdomen (series 2/image 24).  5.7 x 4.0 cm peritoneal implant in the lower pelvis (series 2/ image 72).  IMPRESSION: Peritoneal/omental disease, including a dominant 5.7 cm pelvic implant. Underlying primary malignancy is unclear from the current CT, although a nonvisualized GI primary would be the leading concern.  Possible mass at the gastric cardia/ GE junction, equivocal. Consider upper endoscopy for further evaluation. If negative, colonoscopy is suggested.  Scattered small retrocrural, upper abdominal, and retroperitoneal nodes, as above.  Suspected cirrhosis.  Cholelithiasis. Prior splenectomy with suspected residual splenic tissue in the left upper abdomen.   Electronically Signed   By: Julian Hy M.D.   On: 04/15/2014 18:54    Scheduled Meds: . aspirin EC  325 mg Oral Daily  . heparin  5,000 Units Subcutaneous 3 times per day  . hydrochlorothiazide  25 mg Oral Daily  . methylPREDNISolone (SOLU-MEDROL) injection  60 mg Intravenous Q6H  . polyethylene glycol  17 g Oral BID  . senna  1-2 tablet Oral BID   Continuous Infusions: . sodium chloride    . sodium chloride     Assessment and plan:  Principal Problem:   Abdominal pain, epigastric Active Problems:   Peritoneal carcinomatosis   Gastric mass   Elevated LFTs   COPD exacerbation   CAD (coronary artery disease)   Constipation   Family history of colon cancer   History of ETOH abuse   Obesity, unspecified   History of splenectomy   Tobacco abuse    1. Abdominal pain, likely secondary to possible gastric mass and presumed peritoneal carcinomatosis.  We'll titrate up the Dilaudid as needed for pain. We'll add Protonix IV empirically. Presumed peritoneal carcinomatosis and possible gastric mass per CT of the abdomen and pelvis. There is a  positive family history of colon cancer in his brother. He has had worsening constipation over the last 2 weeks. These findings are concerning for some type of malignancy. CEA and CA 19-9 we'll order on admission. Gastroenterology has been consulted for further evaluation. Elevated LFTs. The patient has a history of alcohol abuse. He reports stopping in January of 2015, but admitted having 1 beer last week. Essentially, he no longer drinks. He also has gallstones noted on the CT. The elevated liver transaminases may be the consequence of alcohol abuse and cholelithiasis. We'll order an acute viral hepatitis panel for further evaluation. Constipation.  Laxative therapy was ordered on admission. The patient may need a colonoscopy, but this will  be deferred to gastroenterology. Reported history of CAD and hypertension. The patient denies having a heart attack, but was told he had significant coronary artery disease. He has not had evaluation with cardiac catheterization. He denies any history of congestive heart failure. He had been treated with metoprolol and lisinopril in the past for hypertension, but was unable to afford medications. We'll discontinue hydrochlorothiazide while the patient is being gently hydrated. Will start lisinopril at a small dose. We'll hold off on Toprol because of the bronchospasms. Will hold aspirin for now. His initial troponin I was negative. We will consider ordering an echocardiogram to evaluate his EF. COPD with exacerbation. The patient has a long history of smoking one pack of cigarettes per day, but he stopped 2 days ago, but he wants a nicotine patch which will be ordered. We'll continue IV Solu-Medrol, but will taper it down. We'll start Levaquin empirically. Will change nebulizers to scheduled every 6 hours. Hyperglycemia.  Likely steroid induced. His hemoglobin A1c was 5.8. We'll start sliding scale NovoLog Of steroid therapy. Obesity. We'll order a carbohydrate modified/heart healthy diet when he is not n.p.o. We'll order TSH.    Time spent: 40 minutes.    [facility] Pager [phone]. If 7PM-7AM, please contact night-coverage at [email or web address], password [facility] 04/16/2014, 11:06 AM  LOS: 1 day

## 2014-04-16 NOTE — Consult Note (Signed)
Referring Provider: No ref. 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  CT SHOWS METASTATIC DISEASE ETIOLOGY UNKNOWN. I PERSONALLY REVIEWED CT WITH DR. Martinique -NO OBSTRUCTION/PERITONEAL IMPLANTS (L>R).   Plan: 1. EGD TODAY 2. AGREE WITH ONCOLOGY CONSULT 3. BID PPI 4. NPO EXCEPT SIPS WITH MEDS UNTIL AFTER EGD. 5. DUONEB NOW   HPI:  R SIDE PAIN FOR 2-3 WEEK SETTING WORSE. CHANGE IN BOWEL HABITS FOR 2 WEEKS. NAUSEA(OFF AND ON, MODERATE) AND VOMITING IN PAST 3-4 DAYS. EVERY DAY CLEAR WATER LAST NIGHT. YESTERDAY 2-3 TIMES(NO BLOOD). SUBJECTIVE CHILLS. ASA/ADVIL/TYLENOL 6-10 TIMES A DAY FOR PAST MONTH FOR CHEST AND BELLY PAIN. 2 DAYS AGO: LOOSE STOOLS(x1). TOOK 2 LAXATIVES([facility]) Demetrius Charity AND YESTERDAY. ?WEIGHT LOSS: 5-10 LBS(MAX 300 LBS). CIGS: QUIT YESTERDAY(1 PK/DAY FOR 35 YRS). NEVER REG ETOH. 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  CHEST PAIN OFF AND ON SINCE JAN 2015 AND APPLIED FOR DISABILITY. BIL KNEE PAIN. LAST OPV MAR 2015. BACK IN 90s: TCS-NO POLYPS. SEVERAL YEARS heartburn or indigestion-Rx OTC   PT DENIES FEVER, CHILLS, HEMATOCHEZIA, HEMATEMESIS, melena, diarrhea, CHEST PAIN, SHORTNESS OF BREATH,  problems swallowing, OR  problems with sedation.   Past Medical History  Diagnosis Date  . Hypertension   . Coronary artery disease   . Asthma   . COPD (chronic obstructive pulmonary disease)     emphysema  . Abdominal trauma   . 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   Past Surgical History  Procedure Laterality Date  . Splenectomy  1974  . Tonsillectomy      Prior to Admission medications   Medication Sig Start Date End Date Taking? Authorizing Provider  acetaminophen (TYLENOL) 500 MG tablet Take 500 mg by mouth every 6 (six) hours as needed for mild  pain or moderate pain.   Yes Historical Provider, MD  aspirin EC 325 MG tablet Take 325 mg by mouth daily.   Yes Historical Provider, MD  Cyanocobalamin (B-12 PO) Take 1 tablet by mouth daily.   Yes Historical Provider, MD  ibuprofen (ADVIL,MOTRIN) 200 MG tablet Take 200 mg by mouth every 6 (six) hours as needed for mild pain or moderate pain.   Yes Historical Provider, MD  Multiple Vitamin (MULTIVITAMIN WITH MINERALS) TABS tablet Take 1 tablet by mouth daily.   Yes Historical Provider, MD    Current Facility-Administered Medications  Medication Dose Route Frequency Provider Last Rate Last Dose  .         .         . acetaminophen (TYLENOL) tablet 650 mg  650 mg Oral Q6H PRN Waldemar Dickens, MD       Or  . acetaminophen (TYLENOL) suppository 650 mg  650 mg Rectal Q6H PRN Waldemar Dickens, MD      . heparin injection 5,000 Units  5,000 Units Subcutaneous 3 times per day Waldemar Dickens, MD   5,000 Units at 04/15/14 2259  . HYDROmorphone (DILAUDID) injection 1 mg  1 mg Intravenous Q2H PRN Rexene Alberts, MD   1 mg at 04/16/14 1114  . insulin aspart (novoLOG) injection 0-15 Units  0-15 Units Subcutaneous TID WC Rexene Alberts, MD   2 Units at 04/16/14 1154  . insulin aspart (novoLOG) injection 0-5 Units  0-5 Units Subcutaneous QHS Rexene Alberts, MD      .  ipratropium-albuterol (DUONEB) 0.5-2.5 (3) MG/3ML nebulizer solution 3 mL  3 mL Nebulization Q6H Rexene Alberts, MD      . levofloxacin (LEVAQUIN) IVPB 500 mg  500 mg Intravenous Q24H Rexene Alberts, MD   500 mg at 04/16/14 1155  . lisinopril (PRINIVIL,ZESTRIL) tablet 2.5 mg  2.5 mg Oral Daily Rexene Alberts, MD   2.5 mg at 04/16/14 1154  . methylPREDNISolone sodium succinate (SOLU-MEDROL) 125 mg/2 mL injection 60 mg  60 mg Intravenous 3 times per day Rexene Alberts, MD      . nicotine (NICODERM CQ - dosed in mg/24 hours) patch 14 mg  14 mg Transdermal Daily Rexene Alberts, MD   14 mg at 04/16/14 1155  . ondansetron (ZOFRAN) tablet 4 mg  4 mg Oral Q6H  PRN Waldemar Dickens, MD       Or  . ondansetron [facility]) injection 4 mg  4 mg Intravenous Q6H PRN Waldemar Dickens, MD   4 mg at 04/16/14 1114  . oxazepam (SERAX) capsule 15 mg  15 mg Oral QID PRN Orvan Falconer, MD   15 mg at 04/16/14 1660  . pantoprazole (PROTONIX) injection 40 mg  40 mg Intravenous Daily Rexene Alberts, MD   40 mg at 04/16/14 1154  . polyethylene glycol (MIRALAX / GLYCOLAX) packet 17 g  17 g Oral BID Waldemar Dickens, MD   17 g at 04/16/14 0758  . senna (SENOKOT) tablet 8.6-17.2 mg  1-2 tablet Oral BID Waldemar Dickens, MD   17.2 mg at 04/16/14 0759    Allergies as of 04/15/2014 - Review Complete 04/15/2014  Allergen Reaction Noted  . Penicillins Hives 04/15/2014    Family History  Problem Relation Age of Onset  . Cancer Maternal Grandmother     lung  . Leukemia Maternal Uncle   . Colon cancer Brother     Familial polyposis  . Stomach cancer Maternal Aunt   . Hypertension Maternal Grandfather   . Heart attack Brother 34  . Stroke Brother 22    History   Social History  . Marital Status: Married    Spouse Name: N/A    Number of Children: N/A  . Years of Education: N/A   Occupational History  . Not on file.   Social History Main Topics  . Smoking status: Current Every Day Smoker -- 1.00 packs/day    Types: Cigarettes  . Smokeless tobacco: Not on file  . Alcohol Use: No  . Drug Use: No  . Sexual Activity: Not on file   Other Topics Concern  . Not on file   Social History Narrative  . No narrative on file   Review of Systems: PER HPI OTHERWISE ALL SYSTEMS ARE NEGATIVE.  Vitals: Blood pressure 106/61, pulse 74, temperature 98.1 F (36.7 C), temperature source Oral, resp. rate 18, height 5\' 7"  (1.702 m), weight 271 lb 9.7 oz (123.2 kg), SpO2 92.00%.  Physical Exam: General:   Alert,  OBESE, pleasant and cooperative in NAD Head:  Normocephalic and atraumatic. Eyes:  Sclera clear, no icterus.   Conjunctiva pink. Mouth:  dentition ABnormal. Neck:   Supple; no masses. Lungs:  Clear throughout to auscultation.   BILATERAL wheezes BOTH LUNG Jairen Goldfarb. No acute distress. Heart:  Regular rate and rhythm; no murmurs. Abdomen:  Soft, MODERATE TTP IN RLQ,  nondistended. Normal bowel sounds, without guarding, and without rebound.   Msk:  Symmetrical without gross deformities. Normal posture. Extremities:  Without edema. Neurologic:  Alert and  oriented x4;  grossly  normal neurologically. Cervical Nodes:  No significant cervical adenopathy. Psych:  Alert and cooperative. Normal mood and FLAT affect.  Lab Results:  Recent Labs  04/15/14 1640 04/15/14 2133 04/16/14 0549  WBC 13.3* 14.5* 7.7  HGB 17.4* 16.6 16.7  HCT 50.7 49.0 50.5  PLT 221 227 225   BMET  Recent Labs  04/15/14 1640 04/15/14 2133 04/16/14 0549  NA 140  --  138  K 4.3  --  4.3  CL 105  --  103  CO2 22  --  24  GLUCOSE 122*  --  149*  BUN 11  --  10  CREATININE 0.89 0.87 0.83  CALCIUM 9.0  --  8.6   LFT  Recent Labs  04/16/14 0549  PROT 7.1  ALBUMIN 3.5  AST 106*  ALT 144*  ALKPHOS 57  BILITOT 0.3     Studies/Results: CT ABD/PELVIS/CXR SEP 19: METASTATIC DISEASE ETIOLOGY UNCLEAR   LOS: 1 day   [facility]  04/16/2014, 11:55 AM

## 2014-04-16 NOTE — Progress Notes (Signed)
Patient complain of abdomen pain, with little relief from dilaudid 1mg . Patient also complains about constipation. Dr. Caryn Section notified.

## 2014-04-16 NOTE — H&P (View-Only) (Signed)
Referring Provider: No ref. 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  CT SHOWS METASTATIC DISEASE ETIOLOGY UNKNOWN. I PERSONALLY REVIEWED CT WITH DR. Martinique -NO OBSTRUCTION/PERITONEAL IMPLANTS (L>R).   Plan: 1. EGD TODAY 2. AGREE WITH ONCOLOGY CONSULT 3. BID PPI 4. NPO EXCEPT SIPS WITH MEDS UNTIL AFTER EGD. 5. DUONEB NOW   HPI:  R SIDE PAIN FOR 2-3 WEEK SETTING WORSE. CHANGE IN BOWEL HABITS FOR 2 WEEKS. NAUSEA(OFF AND ON, MODERATE) AND VOMITING IN PAST 3-4 DAYS. EVERY DAY CLEAR WATER LAST NIGHT. YESTERDAY 2-3 TIMES(NO BLOOD). SUBJECTIVE CHILLS. ASA/ADVIL/TYLENOL 6-10 TIMES A DAY FOR PAST MONTH FOR CHEST AND BELLY PAIN. 2 DAYS AGO: LOOSE STOOLS(x1). TOOK 2 LAXATIVES([facility]) Demetrius Charity AND YESTERDAY. ?WEIGHT LOSS: 5-10 LBS(MAX 300 LBS). CIGS: QUIT YESTERDAY(1 PK/DAY FOR 35 YRS). NEVER REG ETOH. 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  CHEST PAIN OFF AND ON SINCE JAN 2015 AND APPLIED FOR DISABILITY. BIL KNEE PAIN. LAST OPV MAR 2015. BACK IN 90s: TCS-NO POLYPS. SEVERAL YEARS heartburn or indigestion-Rx OTC   PT DENIES FEVER, CHILLS, HEMATOCHEZIA, HEMATEMESIS, melena, diarrhea, CHEST PAIN, SHORTNESS OF BREATH,  problems swallowing, OR  problems with sedation.   Past Medical History  Diagnosis Date  . Hypertension   . Coronary artery disease   . Asthma   . COPD (chronic obstructive pulmonary disease)     emphysema  . Abdominal trauma   . 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   Past Surgical History  Procedure Laterality Date  . Splenectomy  1974  . Tonsillectomy      Prior to Admission medications   Medication Sig Start Date End Date Taking? Authorizing Provider  acetaminophen (TYLENOL) 500 MG tablet Take 500 mg by mouth every 6 (six) hours as needed for mild  pain or moderate pain.   Yes Historical Provider, MD  aspirin EC 325 MG tablet Take 325 mg by mouth daily.   Yes Historical Provider, MD  Cyanocobalamin (B-12 PO) Take 1 tablet by mouth daily.   Yes Historical Provider, MD  ibuprofen (ADVIL,MOTRIN) 200 MG tablet Take 200 mg by mouth every 6 (six) hours as needed for mild pain or moderate pain.   Yes Historical Provider, MD  Multiple Vitamin (MULTIVITAMIN WITH MINERALS) TABS tablet Take 1 tablet by mouth daily.   Yes Historical Provider, MD    Current Facility-Administered Medications  Medication Dose Route Frequency Provider Last Rate Last Dose  .         .         . acetaminophen (TYLENOL) tablet 650 mg  650 mg Oral Q6H PRN Waldemar Dickens, MD       Or  . acetaminophen (TYLENOL) suppository 650 mg  650 mg Rectal Q6H PRN Waldemar Dickens, MD      . heparin injection 5,000 Units  5,000 Units Subcutaneous 3 times per day Waldemar Dickens, MD   5,000 Units at 04/15/14 2259  . HYDROmorphone (DILAUDID) injection 1 mg  1 mg Intravenous Q2H PRN Rexene Alberts, MD   1 mg at 04/16/14 1114  . insulin aspart (novoLOG) injection 0-15 Units  0-15 Units Subcutaneous TID WC Rexene Alberts, MD   2 Units at 04/16/14 1154  . insulin aspart (novoLOG) injection 0-5 Units  0-5 Units Subcutaneous QHS Rexene Alberts, MD      .  ipratropium-albuterol (DUONEB) 0.5-2.5 (3) MG/3ML nebulizer solution 3 mL  3 mL Nebulization Q6H Rexene Alberts, MD      . levofloxacin (LEVAQUIN) IVPB 500 mg  500 mg Intravenous Q24H Rexene Alberts, MD   500 mg at 04/16/14 1155  . lisinopril (PRINIVIL,ZESTRIL) tablet 2.5 mg  2.5 mg Oral Daily Rexene Alberts, MD   2.5 mg at 04/16/14 1154  . methylPREDNISolone sodium succinate (SOLU-MEDROL) 125 mg/2 mL injection 60 mg  60 mg Intravenous 3 times per day Rexene Alberts, MD      . nicotine (NICODERM CQ - dosed in mg/24 hours) patch 14 mg  14 mg Transdermal Daily Rexene Alberts, MD   14 mg at 04/16/14 1155  . ondansetron (ZOFRAN) tablet 4 mg  4 mg Oral Q6H  PRN Waldemar Dickens, MD       Or  . ondansetron [facility]) injection 4 mg  4 mg Intravenous Q6H PRN Waldemar Dickens, MD   4 mg at 04/16/14 1114  . oxazepam (SERAX) capsule 15 mg  15 mg Oral QID PRN Orvan Falconer, MD   15 mg at 04/16/14 9242  . pantoprazole (PROTONIX) injection 40 mg  40 mg Intravenous Daily Rexene Alberts, MD   40 mg at 04/16/14 1154  . polyethylene glycol (MIRALAX / GLYCOLAX) packet 17 g  17 g Oral BID Waldemar Dickens, MD   17 g at 04/16/14 0758  . senna (SENOKOT) tablet 8.6-17.2 mg  1-2 tablet Oral BID Waldemar Dickens, MD   17.2 mg at 04/16/14 0759    Allergies as of 04/15/2014 - Review Complete 04/15/2014  Allergen Reaction Noted  . Penicillins Hives 04/15/2014    Family History  Problem Relation Age of Onset  . Cancer Maternal Grandmother     lung  . Leukemia Maternal Uncle   . Colon cancer Brother     Familial polyposis  . Stomach cancer Maternal Aunt   . Hypertension Maternal Grandfather   . Heart attack Brother 93  . Stroke Brother 37    History   Social History  . Marital Status: Married    Spouse Name: N/A    Number of Children: N/A  . Years of Education: N/A   Occupational History  . Not on file.   Social History Main Topics  . Smoking status: Current Every Day Smoker -- 1.00 packs/day    Types: Cigarettes  . Smokeless tobacco: Not on file  . Alcohol Use: No  . Drug Use: No  . Sexual Activity: Not on file   Other Topics Concern  . Not on file   Social History Narrative  . No narrative on file   Review of Systems: PER HPI OTHERWISE ALL SYSTEMS ARE NEGATIVE.  Vitals: Blood pressure 106/61, pulse 74, temperature 98.1 F (36.7 C), temperature source Oral, resp. rate 18, height 5\' 7"  (1.702 m), weight 271 lb 9.7 oz (123.2 kg), SpO2 92.00%.  Physical Exam: General:   Alert,  OBESE, pleasant and cooperative in NAD Head:  Normocephalic and atraumatic. Eyes:  Sclera clear, no icterus.   Conjunctiva pink. Mouth:  dentition ABnormal. Neck:   Supple; no masses. Lungs:  Clear throughout to auscultation.   BILATERAL wheezes BOTH LUNG Michaelle Bottomley. No acute distress. Heart:  Regular rate and rhythm; no murmurs. Abdomen:  Soft, MODERATE TTP IN RLQ,  nondistended. Normal bowel sounds, without guarding, and without rebound.   Msk:  Symmetrical without gross deformities. Normal posture. Extremities:  Without edema. Neurologic:  Alert and  oriented x4;  grossly  normal neurologically. Cervical Nodes:  No significant cervical adenopathy. Psych:  Alert and cooperative. Normal mood and FLAT affect.  Lab Results:  Recent Labs  04/15/14 1640 04/15/14 2133 04/16/14 0549  WBC 13.3* 14.5* 7.7  HGB 17.4* 16.6 16.7  HCT 50.7 49.0 50.5  PLT 221 227 225   BMET  Recent Labs  04/15/14 1640 04/15/14 2133 04/16/14 0549  NA 140  --  138  K 4.3  --  4.3  CL 105  --  103  CO2 22  --  24  GLUCOSE 122*  --  149*  BUN 11  --  10  CREATININE 0.89 0.87 0.83  CALCIUM 9.0  --  8.6   LFT  Recent Labs  04/16/14 0549  PROT 7.1  ALBUMIN 3.5  AST 106*  ALT 144*  ALKPHOS 57  BILITOT 0.3     Studies/Results: CT ABD/PELVIS/CXR SEP 19: METASTATIC DISEASE ETIOLOGY UNCLEAR   LOS: 1 day   [facility]  04/16/2014, 11:55 AM

## 2014-04-16 NOTE — Interval H&P Note (Signed)
History and Physical Interval Note:  04/16/2014 1:10 PM  Jeremy Mcgee  has presented today for surgery, with the diagnosis of possible stomach tumor  The various methods of treatment have been discussed with the patient and family. After consideration of risks, benefits and other options for treatment, the patient has consented to  Procedure(s): ESOPHAGOGASTRODUODENOSCOPY (EGD) (N/A) as a surgical intervention .  The patient's history has been reviewed, patient examined, no change in status, stable for surgery.  I have reviewed the patient's chart and labs.  Questions were answered to the patient's satisfaction.     [employer]

## 2014-04-16 NOTE — Procedures (Signed)
Findings: 1. Hiatal hernia 2. GASTRITIS 3. SMALL AMOUNT OF RETAINED FOOD IN STOMACH 4. NO SOURCE FOR METASTATIC DISEASE IDENTIFIED.  RECOMMENDATIONS: 1. AWAIT BIOPSY 2. COLONOSCOPY SEP 20   FULL REPORT TO FOLLOW.

## 2014-04-17 ENCOUNTER — Encounter (HOSPITAL_COMMUNITY): Payer: Self-pay | Admitting: *Deleted

## 2014-04-17 ENCOUNTER — Encounter (HOSPITAL_COMMUNITY)
Admission: EM | Disposition: A | Discharge status: Home Health | Payer: Self-pay | Source: Home / Self Care | Attending: Internal Medicine

## 2014-04-17 DIAGNOSIS — F17200 Nicotine dependence, unspecified, uncomplicated: Secondary | ICD-10-CM

## 2014-04-17 DIAGNOSIS — K449 Diaphragmatic hernia without obstruction or gangrene: Secondary | ICD-10-CM | POA: Diagnosis present

## 2014-04-17 DIAGNOSIS — K2990 Gastroduodenitis, unspecified, without bleeding: Secondary | ICD-10-CM

## 2014-04-17 DIAGNOSIS — K2970 Gastritis, unspecified, without bleeding: Secondary | ICD-10-CM | POA: Diagnosis present

## 2014-04-17 DIAGNOSIS — D126 Benign neoplasm of colon, unspecified: Secondary | ICD-10-CM

## 2014-04-17 DIAGNOSIS — R1013 Epigastric pain: Secondary | ICD-10-CM

## 2014-04-17 DIAGNOSIS — K635 Polyp of colon: Secondary | ICD-10-CM

## 2014-04-17 HISTORY — DX: Diaphragmatic hernia without obstruction or gangrene: K44.9

## 2014-04-17 HISTORY — DX: Gastritis, unspecified, without bleeding: K29.70

## 2014-04-17 HISTORY — DX: Polyp of colon: K63.5

## 2014-04-17 HISTORY — PX: COLONOSCOPY: SHX5424

## 2014-04-17 LAB — BASIC METABOLIC PANEL
ANION GAP: 13 (ref 5–15)
BUN: 8 mg/dL (ref 6–23)
CALCIUM: 8.4 mg/dL (ref 8.4–10.5)
CHLORIDE: 101 meq/L (ref 96–112)
CO2: 27 mEq/L (ref 19–32)
CREATININE: 0.74 mg/dL (ref 0.50–1.35)
GFR calc non Af Amer: >90 mL/min (ref >90)
Glucose, Bld: 135 mg/dL — ABNORMAL HIGH (ref 70–99)
Potassium: 3.9 mEq/L (ref 3.7–5.3)
SODIUM: 141 meq/L (ref 137–147)

## 2014-04-17 LAB — CBC
HCT: 47.5 % (ref 39.0–52.0)
Hemoglobin: 15.4 g/dL (ref 13.0–17.0)
MCH: 31.9 pg (ref 26.0–34.0)
MCHC: 32.4 g/dL (ref 30.0–36.0)
MCV: 98.3 fL (ref 78.0–100.0)
Platelets: 234 10*3/uL (ref 150–400)
RBC: 4.83 MIL/uL (ref 4.22–5.81)
RDW: 15.1 % (ref 11.5–15.5)
WBC: 19.5 10*3/uL — ABNORMAL HIGH (ref 4.0–10.5)

## 2014-04-17 LAB — HEPATIC FUNCTION PANEL
ALT: 124 U/L — ABNORMAL HIGH (ref 0–53)
AST: 73 U/L — AB (ref 0–37)
Albumin: 3.7 g/dL (ref 3.5–5.2)
Alkaline Phosphatase: 52 U/L (ref 39–117)
Bilirubin, Direct: <0.2 mg/dL (ref 0.0–0.3)
Total Bilirubin: 0.3 mg/dL (ref 0.3–1.2)
Total Protein: 7 g/dL (ref 6.0–8.3)

## 2014-04-17 LAB — CEA: CEA: 0.9 ng/mL (ref 0.0–5.0)

## 2014-04-17 LAB — GLUCOSE, CAPILLARY
GLUCOSE-CAPILLARY: 113 mg/dL — AB (ref 70–99)
GLUCOSE-CAPILLARY: 199 mg/dL — AB (ref 70–99)
Glucose-Capillary: 139 mg/dL — ABNORMAL HIGH (ref 70–99)
Glucose-Capillary: 142 mg/dL — ABNORMAL HIGH (ref 70–99)

## 2014-04-17 LAB — HEPATITIS PANEL, ACUTE
HCV Ab: REACTIVE — AB
HEP B C IGM: NONREACTIVE
HEP B S AG: NEGATIVE
Hep A IgM: NONREACTIVE

## 2014-04-17 LAB — TSH: TSH: 0.331 u[IU]/mL — ABNORMAL LOW (ref 0.350–4.500)

## 2014-04-17 LAB — CANCER ANTIGEN 19-9: CA 19-9: 12.4 U/mL — ABNORMAL LOW (ref <35.0)

## 2014-04-17 SURGERY — COLONOSCOPY
Anesthesia: Moderate Sedation

## 2014-04-17 MED ORDER — PROMETHAZINE HCL 25 MG/ML IJ SOLN
INTRAMUSCULAR | Status: DC | PRN
  Administered 2014-04-17: 12.5 mg via INTRAVENOUS

## 2014-04-17 MED ORDER — ALBUTEROL SULFATE (2.5 MG/3ML) 0.083% IN NEBU: 2.5000 mg | INHALATION_SOLUTION | RESPIRATORY_TRACT | Status: DC | PRN

## 2014-04-17 MED ORDER — FENTANYL CITRATE 0.05 MG/ML IJ SOLN
INTRAMUSCULAR | Status: DC | PRN
  Administered 2014-04-17 (×2): 25 ug via INTRAVENOUS
  Administered 2014-04-17: 50 ug via INTRAVENOUS
  Administered 2014-04-17 (×2): 25 ug via INTRAVENOUS

## 2014-04-17 MED ORDER — PROMETHAZINE HCL 25 MG/ML IJ SOLN
12.5000 mg | Freq: Once | INTRAMUSCULAR | Status: AC
  Administered 2014-04-17: 12.5 mg via INTRAVENOUS
  Filled 2014-04-17: qty 1

## 2014-04-17 MED ORDER — PROMETHAZINE HCL 25 MG/ML IJ SOLN
INTRAMUSCULAR | Status: AC
  Filled 2014-04-17: qty 1

## 2014-04-17 MED ORDER — FENTANYL CITRATE 0.05 MG/ML IJ SOLN
INTRAMUSCULAR | Status: AC
  Filled 2014-04-17: qty 2

## 2014-04-17 MED ORDER — MIDAZOLAM HCL 5 MG/5ML IJ SOLN
INTRAMUSCULAR | Status: DC | PRN
  Administered 2014-04-17: 1 mg via INTRAVENOUS
  Administered 2014-04-17 (×2): 2 mg via INTRAVENOUS
  Administered 2014-04-17: 1 mg via INTRAVENOUS

## 2014-04-17 MED ORDER — SODIUM CHLORIDE 0.9 % IV SOLN: INTRAVENOUS | Status: DC

## 2014-04-17 MED ORDER — SIMETHICONE 40 MG/0.6ML PO SUSP
ORAL | Status: DC | PRN
  Administered 2014-04-17: 10:00:00

## 2014-04-17 MED ORDER — SODIUM CHLORIDE 0.9 % IJ SOLN
INTRAMUSCULAR | Status: AC
  Filled 2014-04-17: qty 10

## 2014-04-17 MED ORDER — IPRATROPIUM-ALBUTEROL 0.5-2.5 (3) MG/3ML IN SOLN
3.0000 mL | Freq: Four times a day (QID) | RESPIRATORY_TRACT | Status: DC
  Administered 2014-04-18 – 2014-04-22 (×16): 3 mL via RESPIRATORY_TRACT
  Filled 2014-04-17 (×17): qty 3

## 2014-04-17 MED ORDER — SENNA 8.6 MG PO TABS
1.0000 | ORAL_TABLET | Freq: Two times a day (BID) | ORAL | Status: DC
  Administered 2014-04-17 – 2014-04-22 (×10): 8.6 mg via ORAL
  Filled 2014-04-17 (×10): qty 1

## 2014-04-17 MED ORDER — MIDAZOLAM HCL 5 MG/5ML IJ SOLN
INTRAMUSCULAR | Status: AC
  Filled 2014-04-17: qty 10

## 2014-04-17 MED ORDER — LISINOPRIL 5 MG PO TABS
5.0000 mg | ORAL_TABLET | Freq: Every day | ORAL | Status: DC
  Administered 2014-04-18 – 2014-04-22 (×5): 5 mg via ORAL
  Filled 2014-04-17 (×5): qty 1

## 2014-04-17 NOTE — Interval H&P Note (Signed)
History and Physical Interval Note:  04/17/2014 10:12 AM  Jeremy Mcgee  has presented today for surgery, with the diagnosis of CHANGE IN BOWEL HABITS, METASTATIC DISEASE UNKNOWN PRIMARY  The various methods of treatment have been discussed with the patient and family. After consideration of risks, benefits and other options for treatment, the patient has consented to  Procedure(s): COLONOSCOPY (N/A) as a surgical intervention .  The patient's history has been reviewed, patient examined, no change in status, stable for surgery.  I have reviewed the patient's chart and labs.  Questions were answered to the patient's satisfaction.     [employer]

## 2014-04-17 NOTE — Procedures (Signed)
FINDINGS:  1. REDUNDANT LEFT COLON 2. FOUR COLON POLYPS(2-TC, 2 DC) 3. INTERNAL HEMORRHOIDS  DIAGNOSIS: PRESUMED METASTATIC CANCER UNKNOWN PRIMARY  RECOMMENDATIONS: 1. CHECK CEA 2. ADVANCE DIET 3. AWAIT ONCOLOGY RECS. 4. NO ADDITIONAL GI W/U NEEDED AT THIS TIME  FULL REPORT TO FOLLOW

## 2014-04-17 NOTE — Progress Notes (Addendum)
[facility] PROGRESS NOTE  Jeremy Mcgee QIW:[id number] DOB: January 06, 1966 DOA: 04/15/2014 PCP: No PCP Per Patient    Code Status: Full code Family Communication: Discussed with patient, family not available. Disposition Plan: Discharge when clinically appropriate.   Consultants:  Gastroenterology, Dr.Fields  Procedures:  04/17/14: Colonoscopy per Dr. Doristine Mango left colon; 4 colon polyps; internal hemorrhoids.    04/16/14: EGD per Dr. Pennie Banter hernia; gastritis; small amount of retained food in the stomach; no source for metastatic disease identified; biopsy pending.  Antibiotics:  Levaquin on 9/19>>  HPI/Subjective: The patient has less abdominal pain on his sides and over the epigastrium. He had multiple bowel movements yesterday following treatment for colonoscopy. He ate all of his lunch. He has no complaints of nausea or vomiting.  Objective: Filed Vitals:   04/17/14 1144  BP: 129/61  Pulse: 84  Temp: 97.9 F (36.6 C)  Resp: 12   Oxygen saturation 97% on room air.  No intake or output data in the 24 hours ending 04/17/14 1357 Filed Weights   04/15/14 1633 04/15/14 2158  Weight: 127.007 kg (280 lb) 123.2 kg (271 lb 9.7 oz)    Exam:   General: Ill-appearing obese [name] inno acute distress.  Cardiovascular: S1, S2, with a soft systolic murmur.  Respiratory: fine bilateral expiratory  wheezes and and occasional  crackles auscultated bilaterally. Breathing nonlabored.  Abdomen: Obese, positive bowel sounds, mildly to moderately  tender in the epigastrium and the hypogastrium; no appreciable distention or masses palpated.  Musculoskeletal: No acute hot joints. Pedal pulses palpable. Trace of pedal edema.  Neuropsychiatric: He is alert and oriented x3. He is Slightly anxious regarding his abdominal findings.  Cranial nerves II through XII are grossly intact.   Data Reviewed: Basic Metabolic Panel:  Recent Labs Lab  04/15/14 1640 04/15/14 2133 04/16/14 0549 04/17/14 0545  NA 140  --  138 141  K 4.3  --  4.3 3.9  CL 105  --  103 101  CO2 22  --  24 27  GLUCOSE 122*  --  149* 135*  BUN 11  --  10 8  CREATININE 0.89 0.87 0.83 0.74  CALCIUM 9.0  --  8.6 8.4   Liver Function Tests:  Recent Labs Lab 04/15/14 1640 04/16/14 0549 04/17/14 0545  AST 119* 106* 73*  ALT 143* 144* 124*  ALKPHOS 65 57 52  BILITOT 0.3 0.3 0.3  PROT 7.3 7.1 7.0  ALBUMIN 3.7 3.5 3.7    Recent Labs Lab 04/15/14 1640  LIPASE 72*   No results found for this basename: AMMONIA,  in the last 168 hours CBC:  Recent Labs Lab 04/15/14 1640 04/15/14 2133 04/16/14 0549 04/17/14 0545  WBC 13.3* 14.5* 7.7 19.5*  NEUTROABS 6.4  --   --   --   HGB 17.4* 16.6 16.7 15.4  HCT 50.7 49.0 50.5 47.5  MCV 96.4 95.9 98.2 98.3  PLT 221 227 225 234   Cardiac Enzymes:  Recent Labs Lab 04/15/14 1640  TROPONINI <0.30   BNP (last 3 results) No results found for this basename: PROBNP,  in the last 8760 hours CBG:  Recent Labs Lab 04/15/14 1644 04/16/14 1135 04/16/14 1724 04/16/14 2115 04/17/14 0734  GLUCAP 135* 149* 203* 161* 139*    No results found for this or any previous visit (from the past 240 hour(s)).   Studies: Dg Chest 2 View  04/15/2014   CLINICAL DATA:  Cough, congestion, weakness  EXAM: CHEST  2 VIEW  COMPARISON:  None.  FINDINGS: The heart size and mediastinal contours are within normal limits. Both lungs are clear. The visualized skeletal structures are unremarkable.  IMPRESSION: No active cardiopulmonary disease.   Electronically Signed   By: Kathreen Devoid   On: 04/15/2014 17:11   Ct Abdomen Pelvis W Contrast  04/15/2014   CLINICAL DATA:  Hervey Ard right-sided abdominal pain x 2-3 weeks, nausea/ vomiting/ diarrhea, dysuria. Prior splenectomy.  EXAM: CT ABDOMEN AND PELVIS WITH CONTRAST  TECHNIQUE: Multidetector CT imaging of the abdomen and pelvis was performed using the standard protocol following bolus  administration of intravenous contrast.  CONTRAST:  46mL OMNIPAQUE IOHEXOL 300 MG/ML SOLN, 186mL OMNIPAQUE IOHEXOL 300 MG/ML SOLN  COMPARISON:  None.  FINDINGS: Lower chest:  Lung bases are clear.  Hepatobiliary: Mild prominence of the left hepatic lobe/ caudate, nonspecific but worrisome for cirrhosis. No focal hepatic lesions.  Possible tiny layering gallstone (series 2/image 30). No associated inflammatory changes. No intrahepatic or extrahepatic ductal dilatation.  Spleen: Surgically absent. 2.7 x 2.1 cm rounded lesion in the left upper abdomen may reflect residual splenic tissue (series 2/ image 10). Additional adjacent possible residual splenic tissue (series 2/image 11).  Pancreas: Within normal limits.  Stomach/Bowel: Mildly prominent soft tissue in the gastric cardia at the GE junction (series 2/image 15), possibly simply reflecting prominent gastric folds but persistent on delayed imaging.  No evidence of bowel obstruction.  Normal appendix.  No definite colonic wall thickening or mass is seen.  Adrenals/urinary tract: Adrenal glands are unremarkable.  Kidneys are within normal limits.  No hydronephrosis.  Vascular/Lymphatic: Atherosclerotic calcifications of the abdominal aorta and branch vessels.  Small upper abdominal lymph nodes, including a 16 mm short axis portacaval node (series 2/image 28), and a 12 mm gastrohepatic node (series 2/ image 16), indeterminate in the setting of suspected cirrhosis.  Additional scattered small retroperitoneal nodes which do not meet pathologic CT size criteria but are increased in number.  Small retrocrural nodes measuring up to 8 mm short axis (series 2/image 11).  Reproductive: Prostate is unremarkable.  Musculoskeletal: Degenerative changes of the visualized thoracolumbar spine.  Other: No abdominopelvic ascites.  Scattered omental implants, measuring up to 13 mm cyst in the anterior left upper abdomen (series 2/image 24).  5.7 x 4.0 cm peritoneal implant in the  lower pelvis (series 2/ image 72).  IMPRESSION: Peritoneal/omental disease, including a dominant 5.7 cm pelvic implant. Underlying primary malignancy is unclear from the current CT, although a nonvisualized GI primary would be the leading concern.  Possible mass at the gastric cardia/ GE junction, equivocal. Consider upper endoscopy for further evaluation. If negative, colonoscopy is suggested.  Scattered small retrocrural, upper abdominal, and retroperitoneal nodes, as above.  Suspected cirrhosis. Cholelithiasis. Prior splenectomy with suspected residual splenic tissue in the left upper abdomen.   Electronically Signed   By: Julian Hy M.D.   On: 04/15/2014 18:54    Scheduled Meds: . heparin  5,000 Units Subcutaneous 3 times per day  . insulin aspart  0-15 Units Subcutaneous TID WC  . insulin aspart  0-5 Units Subcutaneous QHS  . ipratropium-albuterol  3 mL Nebulization Q6H WA  . levofloxacin (LEVAQUIN) IV  500 mg Intravenous Q24H  . lisinopril  2.5 mg Oral Daily  . methylPREDNISolone (SOLU-MEDROL) injection  60 mg Intravenous 3 times per day  . nicotine  14 mg Transdermal Daily  . pantoprazole  40 mg Oral BID AC  . polyethylene glycol  17 g Oral BID  . senna  1-2 tablet  Oral BID   Continuous Infusions:   Assessment and plan:  Principal Problem:   Abdominal pain, epigastric Active Problems:   Peritoneal carcinomatosis   Elevated LFTs   COPD exacerbation   CAD (coronary artery disease)   Family history of colon cancer   Cholelithiasis   Colon polyps   Gastritis   Constipation   History of ETOH abuse   Obesity, unspecified   History of splenectomy   Tobacco abuse   Hiatal hernia    1. Abdominal pain, likely secondary to presumed peritoneal carcinomatosis +/-gastritis seen on the EGD.  CT of his abdomen and pelvis was suggestive of a possible gastric or GE mass, however, there was no gastric mass seen per Dr. Oneida Alar, but rather a hiatal hernia and mild gastritis on the  EGD on 9/19. Await biopsies results. Dilaudid was titrated up for pain. Protonix was started. His diet was advanced which he is tolerating well.  Presumed peritoneal carcinomatosis per CT of the abdomen and pelvis. Per colonoscopy by Dr. Oneida Alar on 9/20, there was no evidence of colon cancer, but the patient had redundant colon and 4 polyps. Colonoscopy performed because of family history of colon cancer in his brother and worsening constipation. Will continue laxative therapy. CEA ordered on admission was within normal limits at 0.9 and CEA 19-9 was elevated at 12.4. Oncology has been consulted and we will await their assessment and recommendations. It is likely that the patient will need a peritoneal or omental biopsy.  Elevated LFTs. The patient has a history of alcohol abuse. He reports stopping in January of 2015, but admitted having 1 beer last week. Essentially, he no longer drinks. He also has gallstones noted on the CT. The elevated liver transaminases may be the consequence of alcohol abuse and cholelithiasis. Constipation. Status post GoLYTELY for colonoscopy. We'll continue MiraLax twice a day.  Reported history of CAD and hypertension. The patient denies having a heart attack, but was told he had significant coronary artery disease. He has not had evaluation with cardiac catheterization. He denies any history of congestive heart failure. He had been treated with metoprolol and lisinopril in the past for hypertension, but was unable to afford medications. HCTZ discontinued following admission. Lisinopril was started and will be titrated up to 5 mg daily. We'll hold off on Toprol because of the bronchospasms. Will hold aspirin for now. His initial troponin I was negative. We will consider ordering an echocardiogram to evaluate his EF. COPD with exacerbation. The patient has a long history of smoking one pack of cigarettes per day, but he stopped 2 days ago, but he wanted a nicotine patch which  was ordered. We'll continue IV Solu-Medrol, but will taper it down. Will continue Levaquin empirically. Will change nebulizers to scheduled every 6 hours. Hyperglycemia.  Likely steroid induced. His hemoglobin A1c was 5.8. We'll Continue sliding scale NovoLog on steroid therapy. Obesity. Carbohydrate modified/heart healthy diet ordered. TSH is pending.  Leukocytosis. This is likely secondary to steroid therapy. We'll continue to follow.    Time spent: 35 minutes.    [facility] Pager [phone]. If 7PM-7AM, please contact night-coverage at [email or web address], password [facility] 04/17/2014, 1:57 PM  LOS: 2 days

## 2014-04-18 ENCOUNTER — Encounter (HOSPITAL_COMMUNITY): Payer: Self-pay | Admitting: Gastroenterology

## 2014-04-18 ENCOUNTER — Inpatient Hospital Stay (HOSPITAL_COMMUNITY): Payer: Self-pay

## 2014-04-18 DIAGNOSIS — R634 Abnormal weight loss: Secondary | ICD-10-CM

## 2014-04-18 DIAGNOSIS — R768 Other specified abnormal immunological findings in serum: Secondary | ICD-10-CM

## 2014-04-18 DIAGNOSIS — R894 Abnormal immunological findings in specimens from other organs, systems and tissues: Secondary | ICD-10-CM

## 2014-04-18 DIAGNOSIS — K7030 Alcoholic cirrhosis of liver without ascites: Secondary | ICD-10-CM | POA: Diagnosis present

## 2014-04-18 HISTORY — DX: Alcoholic cirrhosis of liver without ascites: K70.30

## 2014-04-18 HISTORY — DX: Other specified abnormal immunological findings in serum: R76.8

## 2014-04-18 LAB — GLUCOSE, CAPILLARY
GLUCOSE-CAPILLARY: 120 mg/dL — AB (ref 70–99)
Glucose-Capillary: 123 mg/dL — ABNORMAL HIGH (ref 70–99)
Glucose-Capillary: 159 mg/dL — ABNORMAL HIGH (ref 70–99)
Glucose-Capillary: 162 mg/dL — ABNORMAL HIGH (ref 70–99)

## 2014-04-18 LAB — CBC
HCT: 46.6 % (ref 39.0–52.0)
Hemoglobin: 15.5 g/dL (ref 13.0–17.0)
MCH: 32.6 pg (ref 26.0–34.0)
MCHC: 33.3 g/dL (ref 30.0–36.0)
MCV: 97.9 fL (ref 78.0–100.0)
Platelets: 228 10*3/uL (ref 150–400)
RBC: 4.76 MIL/uL (ref 4.22–5.81)
RDW: 15.2 % (ref 11.5–15.5)
WBC: 19.2 10*3/uL — ABNORMAL HIGH (ref 4.0–10.5)

## 2014-04-18 LAB — PROTIME-INR
INR: 1.03 (ref 0.00–1.49)
Prothrombin Time: 13.5 seconds (ref 11.6–15.2)

## 2014-04-18 LAB — APTT: aPTT: 25 seconds (ref 24–37)

## 2014-04-18 LAB — CEA: CEA: 0.6 ng/mL (ref 0.0–5.0)

## 2014-04-18 MED ORDER — IOHEXOL 300 MG/ML  SOLN
80.0000 mL | Freq: Once | INTRAMUSCULAR | Status: AC | PRN
  Administered 2014-04-18: 80 mL via INTRAVENOUS

## 2014-04-18 MED ORDER — LEVOFLOXACIN 500 MG PO TABS
500.0000 mg | ORAL_TABLET | Freq: Every day | ORAL | Status: DC
  Administered 2014-04-18 – 2014-04-22 (×5): 500 mg via ORAL
  Filled 2014-04-18 (×5): qty 1

## 2014-04-18 MED ORDER — ALBUTEROL SULFATE (2.5 MG/3ML) 0.083% IN NEBU
2.5000 mg | INHALATION_SOLUTION | RESPIRATORY_TRACT | Status: DC | PRN
  Administered 2014-04-18 – 2014-04-21 (×4): 2.5 mg via RESPIRATORY_TRACT
  Filled 2014-04-18 (×4): qty 3

## 2014-04-18 MED ORDER — HEPARIN SODIUM (PORCINE) 5000 UNIT/ML IJ SOLN
5000.0000 [IU] | Freq: Three times a day (TID) | INTRAMUSCULAR | Status: DC
  Administered 2014-04-18 – 2014-04-22 (×10): 5000 [IU] via SUBCUTANEOUS
  Filled 2014-04-18 (×10): qty 1

## 2014-04-18 MED ORDER — HYDROMORPHONE HCL 1 MG/ML IJ SOLN
0.5000 mg | Freq: Three times a day (TID) | INTRAMUSCULAR | Status: AC
  Administered 2014-04-18 – 2014-04-20 (×6): 0.5 mg via INTRAVENOUS
  Filled 2014-04-18 (×6): qty 1

## 2014-04-18 MED ORDER — MORPHINE SULFATE ER 30 MG PO TBCR
60.0000 mg | EXTENDED_RELEASE_TABLET | Freq: Two times a day (BID) | ORAL | Status: DC
  Administered 2014-04-18 – 2014-04-21 (×7): 60 mg via ORAL
  Filled 2014-04-18 (×7): qty 2

## 2014-04-18 MED ORDER — ACETYLCYSTEINE 20 % IN SOLN
RESPIRATORY_TRACT | Status: AC
  Filled 2014-04-18: qty 4

## 2014-04-18 MED ORDER — ACETYLCYSTEINE 20 % IN SOLN
3.0000 mL | Freq: Once | RESPIRATORY_TRACT | Status: AC
  Administered 2014-04-18: 3 mL via RESPIRATORY_TRACT

## 2014-04-18 MED ORDER — OXYCODONE HCL 5 MG PO TABS
5.0000 mg | ORAL_TABLET | ORAL | Status: DC | PRN
  Administered 2014-04-18 – 2014-04-19 (×4): 10 mg via ORAL
  Administered 2014-04-19: 5 mg via ORAL
  Administered 2014-04-19 – 2014-04-21 (×8): 10 mg via ORAL
  Filled 2014-04-18 (×15): qty 2

## 2014-04-18 MED ORDER — METHYLPREDNISOLONE SODIUM SUCC 125 MG IJ SOLR
60.0000 mg | Freq: Two times a day (BID) | INTRAMUSCULAR | Status: DC
Start: 2014-04-18 — End: 2014-04-20
  Administered 2014-04-18 – 2014-04-20 (×5): 60 mg via INTRAVENOUS
  Filled 2014-04-18 (×5): qty 2

## 2014-04-18 NOTE — Progress Notes (Signed)
Patient ID: Jeremy Mcgee, male   DOB: 1966/04/03, 48 y.o.   MRN: [id number]   Request received for biopsy to be performed in Rad at [facility] 9/22  See orders Pt to be at [place] by 9 am 9/22 Via ambulance  Will return to [facility] after procedure

## 2014-04-18 NOTE — Care Management Note (Addendum)
    Page 1 of 2   04/22/2014     2:47:11 PM CARE MANAGEMENT NOTE 04/22/2014  Patient:  Jeremy Mcgee, Jeremy Mcgee   Account Number:  [id number]  Date Initiated:  04/18/2014  Documentation initiated by:  Vladimir Creeks  Subjective/Objective Assessment:   Admitted with abd pain, and CA in the abd. unsure of primary. Pt is from home with girlfriend. they have recently moved here from [facility]. He has applied for SSD in [facility], and is worried that he will have to reapply in [place]. Instructed to call SSA     Action/Plan:   here in [place], but since SSD is a fed program, he should not have to reapply. Pt has no PCP and will need this set up May need assistance with medications   Anticipated DC Date:  04/22/2014   Anticipated DC Plan:  [place]  CM consult  Follow-up appt scheduled  MATCH Program      [facility] Choice  HOME HEALTH   Choice offered to / List presented to:  C-1 Patient   DME arranged  NEBULIZER MACHINE      DME agency  [place] arranged  HH-1 RN      [place].   Status of service:  Completed, signed off Medicare Important Message given?   (If response is "NO", the following Medicare IM given date fields will be blank) Date Medicare IM given:   Medicare IM given by:   Date Additional Medicare IM given:   Additional Medicare IM given by:    Discharge Disposition:  [place]  Per UR Regulation:  Reviewed for med. necessity/level of care/duration of stay  If discussed at [place] of Stay Meetings, dates discussed:    Comments:  9 /25/15 [place] RN/CM pt is self pay, and isa not working at present, so has difficulty affording meds- MATCH voucher given 04/18/14 [place] RN/CM

## 2014-04-18 NOTE — Consult Note (Addendum)
[facility] Consultation Oncology  Name: Jeremy Mcgee      MRN: [id number]    Location: A327/A327-01  Date: 04/18/2014 Time:8:32 AM   REFERRING PHYSICIAN:  Waldemar Dickens, MD  REASON FOR CONSULT:  ??? Malignancy - GI   DIAGNOSIS:  Peritoneal/omental disease, worrisome for malignancy  HISTORY OF PRESENT ILLNESS:   Mr. Jeremy Mcgee is a 48 yo white man with a past medical history significant for H/O EtOH abuse, Tobacco abuse, obesity, COPD, CAD, history of splenectomy, family history of colon cancer, and hiatal hernia who presented to the [facility] ED on 04/15/2014 with chest pain and emesis.  The patient admits to abdominal pain, particularly in the RUQ and right flank that is exacerbated with movement and palpation.  He reports that the pain is well controlled on current pain regimen, but "I hate to bother the nurses every 2 hours."  He would appreciate an increase in his pain medication, but I will defer to his attending because from the patient's history, the pain is well controlled, he just needs it as scheduled every 2 hours PRN.  He note that his appetite has been fair in the hospital, but at home, his appetite is diminished, particularly when he has abdominal pain.  He admits to weight loss, but is unable to quantify the amount of weight loss.  He notes a smoking history, "but I've quit."  He quit on Friday, 9/18, the day of his ED visit.  He notes a 1 ppd smoking history x 35 years.  He also notes a history of EtOH abuse.  He reports that he drinks moderately, but denies EtOH abuse recently.  He reports that he used to bing drink, drinking 18 beers per day during these periods.  He is vague as to how often he did this and his last binge.  He is originally from [place], working a Armed forces logistics/support/administrative officer job for [employer].  He reports that he is retired on disability from that job and now lives locally in order for his long-time girlfriend of 57 years to be closer to her family.  His  girlfriend is 25 years his senior.   I personally reviewed and went over laboratory results with the patient.  The results are noted within this dictation.  I personally reviewed and went over laboratory results with the patient.  The results are noted within this dictation.  I will discuss the patient's case with Dr. Barry Dienes, [place] Surgeon, to help guide future surgical intervention.  Oncologically, he notes abdominal pain and unintentional weight loss.  He also notes some dysphagia, but EGD was unimpressive the other day.  We will complete the staging work-up with CT of chest.  I will discuss case with Onc Surgeon to determine whether biopsy is desired prior to surgical debulking or if exploratory laparotomy with debulking without biopsy would be in the nest interest of the patient.  PAST MEDICAL HISTORY:   Past Medical History  Diagnosis Date  . Hypertension   . Coronary artery disease   . Asthma   . COPD (chronic obstructive pulmonary disease)     emphysema  . Abdominal trauma   . 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  . Colon polyps 04/17/2014  . Gastritis 04/17/2014  . Hiatal hernia 04/17/2014    Could have been possible "gastric mass" on CT    ALLERGIES: Allergies  Allergen Reactions  . Penicillins Hives      MEDICATIONS: I have  reviewed the patient's current medications.     PAST SURGICAL HISTORY Past Surgical History  Procedure Laterality Date  . Splenectomy  1974  . Tonsillectomy      FAMILY HISTORY: Family History  Problem Relation Age of Onset  . Cancer Maternal Grandmother     lung  . Leukemia Maternal Uncle   . Colon cancer Brother     Familial polyposis?  . Stomach cancer Maternal Aunt   . Hypertension Maternal Grandfather   . Heart attack Brother 81  . Stroke Brother 60    SOCIAL HISTORY:  reports that he has been smoking Cigarettes.  He has been smoking about 1.00 pack per day. He does not have any smokeless tobacco history on  file. He reports that he does not drink alcohol or use illicit drugs.  PERFORMANCE STATUS: The patient's performance status is 2 - Symptomatic, <50% confined to bed  PHYSICAL EXAM: Most Recent Vital Signs: Blood pressure 126/87, pulse 83, temperature 98 F (36.7 C), temperature source Oral, resp. rate 16, height _0  (1.702 m), weight 279 lb 12.2 oz (126.9 kg), SpO2 95.00%. General appearance: alert, cooperative, appears older than stated age, mild distress and morbidly obese Head: Normocephalic, without obvious abnormality, atraumatic Neck: no adenopathy, supple, symmetrical, trachea midline and thyroid not enlarged, symmetric, no tenderness/mass/nodules Lungs: wheezes bilaterally Heart: regular rate and rhythm, S1, S2 normal, no murmur, click, rub or gallop Abdomen: normal findings: bowel sounds normal, liver span normal to percussion and no organomegaly and abnormal findings:  mild tenderness in the RUQ and in the right flank Extremities: edema trace- 1+ pitting pretibially, Homans sign is negative, no sign of DVT and no ulcers, gangrene or trophic changes Skin: Skin color, texture, turgor normal. No rashes or lesions Lymph nodes: Cervical, supraclavicular, and axillary nodes normal. Neurologic: Grossly normal  LABORATORY DATA:  Results for orders placed during the hospital encounter of 04/15/14 (from the past 48 hour(s))  GLUCOSE, CAPILLARY     Status: Abnormal   Collection Time    04/16/14 11:35 AM      Result Value Ref Range   Glucose-Capillary 149 (*) 70 - 99 mg/dL   Comment 1 Notify RN    GLUCOSE, CAPILLARY     Status: Abnormal   Collection Time    04/16/14  5:24 PM      Result Value Ref Range   Glucose-Capillary 203 (*) 70 - 99 mg/dL  GLUCOSE, CAPILLARY     Status: Abnormal   Collection Time    04/16/14  9:15 PM      Result Value Ref Range   Glucose-Capillary 161 (*) 70 - 99 mg/dL   Comment 1 Notify RN     Comment 2 Documented in Chart    HEPATITIS PANEL, ACUTE      Status: Abnormal   Collection Time    04/17/14  5:45 AM      Result Value Ref Range   Hepatitis B Surface Ag NEGATIVE  NEGATIVE   HCV Ab Reactive (*) NEGATIVE   Comment: (NOTE)                                                                               This test  is for screening purposes only.  Reactive results should be     confirmed by an alternative method.  Suggest HCV Qualitative, PCR,     test code 83130.  Specimens will be stable for reflex testing up to 3     days after collection.   Hep A IgM NON REACTIVE  NON REACTIVE   Comment: (NOTE)     Effective June 13, 2014, Hepatitis Acute Panel (test code [id number])     will be revised to automatically reflex to the Hepatitis C Viral RNA,     Quantitative, Real-Time PCR assay if the Hepatitis C antibody     screening result is Reactive. This action is being taken to ensure     that the CDC/USPSTF recommended HCV diagnostic algorithm with the     appropriate test reflex needed for accurate interpretation is     followed.   Hep B C IgM NON REACTIVE  NON REACTIVE   Comment: (NOTE)     High levels of Hepatitis B Core IgM antibody are detectable     during the acute stage of Hepatitis B. This antibody is used     to differentiate current from past HBV infection.     Performed at [facility]  CBC     Status: Abnormal   Collection Time    04/17/14  5:45 AM      Result Value Ref Range   WBC 19.5 (*) 4.0 - 10.5 K/uL   RBC 4.83  4.22 - 5.81 MIL/uL   Hemoglobin 15.4  13.0 - 17.0 g/dL   HCT 47.5  39.0 - 52.0 %   MCV 98.3  78.0 - 100.0 fL   MCH 31.9  26.0 - 34.0 pg   MCHC 32.4  30.0 - 36.0 g/dL   RDW 15.1  11.5 - 15.5 %   Platelets 234  150 - 400 K/uL  BASIC METABOLIC PANEL     Status: Abnormal   Collection Time    04/17/14  5:45 AM      Result Value Ref Range   Sodium 141  137 - 147 mEq/L   Potassium 3.9  3.7 - 5.3 mEq/L   Chloride 101  96 - 112 mEq/L   CO2 27  19 - 32 mEq/L   Glucose, Bld 135 (*) 70 - 99 mg/dL   BUN  8  6 - 23 mg/dL   Creatinine, Ser 0.74  0.50 - 1.35 mg/dL   Calcium 8.4  8.4 - 10.5 mg/dL   GFR calc non Af Amer >90  >90 mL/min   GFR calc Af Amer >90  >90 mL/min   Comment: (NOTE)     The eGFR has been calculated using the CKD EPI equation.     This calculation has not been validated in all clinical situations.     eGFR's persistently <90 mL/min signify possible Chronic Kidney     Disease.   Anion gap 13  5 - 15  HEPATIC FUNCTION PANEL     Status: Abnormal   Collection Time    04/17/14  5:45 AM      Result Value Ref Range   Total Protein 7.0  6.0 - 8.3 g/dL   Albumin 3.7  3.5 - 5.2 g/dL   AST 73 (*) 0 - 37 U/L   ALT 124 (*) 0 - 53 U/L   Alkaline Phosphatase 52  39 - 117 U/L   Total Bilirubin 0.3  0.3 - 1.2 mg/dL   Bilirubin, Direct <0.2  0.0 - 0.3 mg/dL   Indirect Bilirubin NOT CALCULATED  0.3 - 0.9 mg/dL  TSH     Status: Abnormal   Collection Time    04/17/14  5:45 AM      Result Value Ref Range   TSH 0.331 (*) 0.350 - 4.500 uIU/mL   Comment: Performed at [place], CAPILLARY     Status: Abnormal   Collection Time    04/17/14  7:34 AM      Result Value Ref Range   Glucose-Capillary 139 (*) 70 - 99 mg/dL   Comment 1 Notify RN    GLUCOSE, CAPILLARY     Status: Abnormal   Collection Time    04/17/14 11:26 AM      Result Value Ref Range   Glucose-Capillary 113 (*) 70 - 99 mg/dL  CEA     Status: None   Collection Time    04/17/14 11:35 AM      Result Value Ref Range   CEA 0.6  0.0 - 5.0 ng/mL   Comment: Performed at [place], CAPILLARY     Status: Abnormal   Collection Time    04/17/14  4:44 PM      Result Value Ref Range   Glucose-Capillary 199 (*) 70 - 99 mg/dL   Comment 1 Notify RN    GLUCOSE, CAPILLARY     Status: Abnormal   Collection Time    04/17/14  8:24 PM      Result Value Ref Range   Glucose-Capillary 142 (*) 70 - 99 mg/dL   Comment 1 Notify RN     Comment 2 Documented in Chart    CBC     Status: Abnormal    Collection Time    04/18/14  7:23 AM      Result Value Ref Range   WBC 19.2 (*) 4.0 - 10.5 K/uL   RBC 4.76  4.22 - 5.81 MIL/uL   Hemoglobin 15.5  13.0 - 17.0 g/dL   HCT 46.6  39.0 - 52.0 %   MCV 97.9  78.0 - 100.0 fL   MCH 32.6  26.0 - 34.0 pg   MCHC 33.3  30.0 - 36.0 g/dL   RDW 15.2  11.5 - 15.5 %   Platelets 228  150 - 400 K/uL  GLUCOSE, CAPILLARY     Status: Abnormal   Collection Time    04/18/14  7:43 AM      Result Value Ref Range   Glucose-Capillary 123 (*) 70 - 99 mg/dL   Comment 1 Notify RN        RADIOGRAPHY:  04/15/2014  CLINICAL DATA: Sharp right-sided abdominal pain x 2-3 weeks,  nausea/ vomiting/ diarrhea, dysuria. Prior splenectomy.  EXAM:  CT ABDOMEN AND PELVIS WITH CONTRAST  TECHNIQUE:  Multidetector CT imaging of the abdomen and pelvis was performed  using the standard protocol following bolus administration of  intravenous contrast.  CONTRAST: 39m OMNIPAQUE IOHEXOL 300 MG/ML SOLN, 1046mOMNIPAQUE  IOHEXOL 300 MG/ML SOLN  COMPARISON: None.  FINDINGS:  Lower chest: Lung bases are clear.  Hepatobiliary: Mild prominence of the left hepatic lobe/ caudate,  nonspecific but worrisome for cirrhosis. No focal hepatic lesions.  Possible tiny layering gallstone (series 2/image 30). No associated  inflammatory changes. No intrahepatic or extrahepatic ductal  dilatation.  Spleen: Surgically absent. 2.7 x 2.1 cm rounded lesion in the left  upper abdomen may reflect residual splenic tissue (series 2/ image  10). Additional adjacent possible residual  splenic tissue (series  2/image 11).  Pancreas: Within normal limits.  Stomach/Bowel: Mildly prominent soft tissue in the gastric cardia at  the GE junction (series 2/image 15), possibly simply reflecting  prominent gastric folds but persistent on delayed imaging.  No evidence of bowel obstruction.  Normal appendix.  No definite colonic wall thickening or mass is seen.  Adrenals/urinary tract: Adrenal glands are  unremarkable.  Kidneys are within normal limits. No hydronephrosis.  Vascular/Lymphatic: Atherosclerotic calcifications of the abdominal  aorta and branch vessels.  Small upper abdominal lymph nodes, including a 16 mm short axis  portacaval node (series 2/image 28), and a 12 mm gastrohepatic node  (series 2/ image 16), indeterminate in the setting of suspected  cirrhosis.  Additional scattered small retroperitoneal nodes which do not meet  pathologic CT size criteria but are increased in number.  Small retrocrural nodes measuring up to 8 mm short axis (series  2/image 11).  Reproductive: Prostate is unremarkable.  Musculoskeletal: Degenerative changes of the visualized  thoracolumbar spine.  Other: No abdominopelvic ascites.  Scattered omental implants, measuring up to 13 mm cyst in the  anterior left upper abdomen (series 2/image 24).  5.7 x 4.0 cm peritoneal implant in the lower pelvis (series 2/ image  72).  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 Underlying primary malignancy is unclear from the current  CT, although a nonvisualized GI primary would be the leading  concern.  Possible mass at the gastric cardia/ GE junction, equivocal.  Consider upper endoscopy for further evaluation. If negative,  colonoscopy is suggested.  Scattered small retrocrural, upper abdominal, and retroperitoneal  nodes, as above.  Suspected cirrhosis. Cholelithiasis. Prior splenectomy with  suspected residual splenic tissue in the left upper abdomen.  Electronically Signed  By: Julian Hy M.D.  On: 04/15/2014 18:54    PATHOLOGY:  None    ASSESSMENT:  1. Peritoneal/Omental disease, worrisome for malignancy with a 5.7 cm pelvic implant.  Unimpressive colonoscopy and EGD.  CEA and CA 19-9 WNL.  Biopsy from EGD are pending, but no obviouos mass.  Differential diagnosis includes primary peritoneal carcinomatosis and GIST (given negative EGD and drop  metastases). 2. Abdominal pain secondary to #1.  Pain well controlled on current regimen. 3. Cirrhosis of liver by radiographic criteria 4. 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 COPD 6. Tobacco abuse 1 ppd x 35 years 7. H/O EtOH abuse  8. Obesity 9. CAD 10. Family history of colon cancer  Patient Active Problem List   Diagnosis Date Noted  . Colon polyps 04/17/2014  . Gastritis 04/17/2014  . Hiatal hernia 04/17/2014  . 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 Elevated LFTs 04/16/2014  . History of ETOH abuse 04/16/2014  . Obesity, unspecified 04/16/2014  . COPD exacerbation 04/16/2014  . CAD (coronary artery disease) 04/16/2014  . Constipation 04/16/2014  . History of splenectomy 04/16/2014  . Family history of colon cancer 04/16/2014  . Abdominal pain, epigastric 04/16/2014  . Tobacco abuse 04/16/2014  . Cholelithiasis 04/16/2014  . Peritoneal disease 04/15/2014     PLAN:  1. I personally reviewed and went over laboratory results with the patient.  The results are noted within this dictation. 2. I personally reviewed and went over radiographic studies with the patient.  The results are noted within this dictation.   3. Chart reviewed 4. Labs today: CA 125 5. CT of chest with contrast to complete staging work-up 6. Will discuss case with Oncology Surgeon 7. Recommend transitioning to PO pain  medications to prep for discharge.  Will transition to long-acting pain medication and short-acting breakthrough pain medications:  A. MS CONTIN 60 mg PO every 12 hours  B. Oxy IR 5-10 mg PO every 4 hours PRN pain and breakthrough pain management  C. Will discontinue IV Dilaudid 8. Continue Bowel regimen with Senokot BID 9. Suspect discharge within the next 12- 48 hours. 10. Will follow along and addend consult note today as appropriate.  All questions were answered. The patient knows to call the clinic with any problems, questions or concerns. We can certainly see the patient much  sooner if necessary.  Patient and plan discussed with Dr. Farrel Gobble and he is in agreement with the aforementioned.   Marianna Cid 04/18/2014    Addendum:  Case reviewed by Dr. Barry Dienes.  She does not feel surgery is indicated at this time.  Therefore, we will pursue a CT guided biopsy of pelvic lesion.  Discussed with Dr. Caryn Section and she is agreeable that this procedure should be done while he is admitted to the hospital.  Order placed for CT-guided biopsy by IR.  [place] 04/18/2014

## 2014-04-18 NOTE — Progress Notes (Signed)
Patient up in room with oxygen off. O2 sat on room air at this time 96%. Pain medication given as ordered.

## 2014-04-18 NOTE — Progress Notes (Signed)
Patient ID: Jeremy Mcgee, male   DOB: 27-Feb-1966, 48 y.o.   MRN: [id number]   Assessment/Plan: ADMITTED WITH METASTATIC DISEASE UNKNOWN PRIMARY. C/O ABODMINAL PAIN THAT IS UNCONTROLLED. JUST STARTED ON MS CONTIN  PLAN: 1. DILAUDID 0.5 MG IV TID FOR 6 DOSES. EXPLAINED TO PT MSCONTIN 2. PERCUTANEOUS BIOPSY SEP 22 3. BID PPI 4. AWAIT BIOPSY  Subjective: Since I last evaluated the patient HE CONTINUES TO C/O ABDOMINAL PAIN.   Objective: Vital signs in last 24 hours: Filed Vitals:   04/18/14 1400  BP: 146/77  Pulse: 87  Temp: 98.3 F (36.8 C)  Resp: 16     General appearance: cooperative and moderate distress Resp: wheezes bilaterally Cardio: regular rate and rhythm GI: soft, MODERATELY tender IN RLQ; bowel sounds normal;    Studies/Results: No results found.  Medications: I have reviewed the patient's current medications.   LOS: 5 days   Barney Drain 01/06/2014, 2:23 PM

## 2014-04-18 NOTE — Progress Notes (Signed)
PHARMACIST - PHYSICIAN COMMUNICATION DR:   Caryn Section CONCERNING: Antibiotic IV to Oral Route Change Policy  RECOMMENDATION: This patient is receiving Levaquin by the intravenous route.  Based on criteria approved by the Pharmacy and Therapeutics Committee, the antibiotic(s) is/are being converted to the equivalent oral dose form(s).   DESCRIPTION: These criteria include:  Patient being treated for a respiratory tract infection, urinary tract infection, cellulitis or clostridium difficile associated diarrhea if on metronidazole  The patient is not neutropenic and does not exhibit a GI malabsorption state  The patient is eating (either orally or via tube) and/or has been taking other orally administered medications for a least 24 hours  The patient is improving clinically and has a Tmax < 100.5  If you have questions about this conversion, please contact the Pharmacy Department  [x]   [phone] )  [facility] []   [phone] )  [facility]  []   [phone] )  [facility] []   [phone] )  [place], PharmD, BCPS 04/18/2014@10 :33 AM

## 2014-04-18 NOTE — Progress Notes (Signed)
[facility] PROGRESS NOTE  Jeremy Mcgee FXT:[id number] DOB: [birth year] DOA: 04/15/2014 PCP: No PCP Per Patient    Code Status: Full code Family Communication: Discussed with patient, family not available. Disposition Plan: Discharge when clinically appropriate.   Consultants:  Gastroenterology, Dr.Fields  Oncology  Procedures:  04/17/14: Colonoscopy per Dr. Doristine Mango left colon; 4 colon polyps; internal hemorrhoids.    04/16/14: EGD per Dr. Pennie Banter hernia; gastritis; small amount of retained food in the stomach; no source for metastatic disease identified; biopsy pending.  Antibiotics:  Levaquin on 9/19>>  HPI/Subjective: The patient has less abdominal pain and he is tolerating his diet. He complains of progressive shortness of breath and wonders if it is from his heart.  Objective: Filed Vitals:   04/18/14 0545  BP: 126/87  Pulse: 83  Temp: 98 F (36.7 C)  Resp: 16   Oxygen saturation 95% on oxygen supplementation.   Intake/Output Summary (Last 24 hours) at 04/18/14 1220 Last data filed at 04/17/14 2027  Gross per 24 hour  Intake    480 ml  Output      0 ml  Net    480 ml   Filed Weights   04/15/14 1633 04/15/14 2158 04/18/14 0545  Weight: 127.007 kg (280 lb) 123.2 kg (271 lb 9.7 oz) 126.9 kg (279 lb 12.2 oz)    Exam:   General: Ill-appearing obese [name] inno acute distress.  Cardiovascular: S1, S2, with a soft systolic murmur.  Respiratory: fine bilateral expiratory  wheezes and and occasional  crackles auscultated bilaterally. Breathing nonlabored.  Abdomen: Obese, positive bowel sounds, decreased  tender in the epigastrium and the hypogastrium; no appreciable distention or masses palpated.  Musculoskeletal: No acute hot joints. Pedal pulses palpable. Trace of pedal edema.  Neuropsychiatric: He is alert and oriented x3. He is Slightly anxious regarding his abdominal findings.  Cranial nerves II through XII are  grossly intact.   Data Reviewed: Basic Metabolic Panel:  Recent Labs Lab 04/15/14 1640 04/15/14 2133 04/16/14 0549 04/17/14 0545  NA 140  --  138 141  K 4.3  --  4.3 3.9  CL 105  --  103 101  CO2 22  --  24 27  GLUCOSE 122*  --  149* 135*  BUN 11  --  10 8  CREATININE 0.89 0.87 0.83 0.74  CALCIUM 9.0  --  8.6 8.4   Liver Function Tests:  Recent Labs Lab 04/15/14 1640 04/16/14 0549 04/17/14 0545  AST 119* 106* 73*  ALT 143* 144* 124*  ALKPHOS 65 57 52  BILITOT 0.3 0.3 0.3  PROT 7.3 7.1 7.0  ALBUMIN 3.7 3.5 3.7    Recent Labs Lab 04/15/14 1640  LIPASE 72*   No results found for this basename: AMMONIA,  in the last 168 hours CBC:  Recent Labs Lab 04/15/14 1640 04/15/14 2133 04/16/14 0549 04/17/14 0545 04/18/14 0723  WBC 13.3* 14.5* 7.7 19.5* 19.2*  NEUTROABS 6.4  --   --   --   --   HGB 17.4* 16.6 16.7 15.4 15.5  HCT 50.7 49.0 50.5 47.5 46.6  MCV 96.4 95.9 98.2 98.3 97.9  PLT 221 227 225 234 228   Cardiac Enzymes:  Recent Labs Lab 04/15/14 1640  TROPONINI <0.30   BNP (last 3 results) No results found for this basename: PROBNP,  in the last 8760 hours CBG:  Recent Labs Lab 04/17/14 1126 04/17/14 1644 04/17/14 2024 04/18/14 0743 04/18/14 1200  GLUCAP 113* 199* 142* 123* 159*  No results found for this or any previous visit (from the past 240 hour(s)).   Studies: No results found.  Scheduled Meds: . heparin  5,000 Units Subcutaneous 3 times per day  . insulin aspart  0-15 Units Subcutaneous TID WC  . insulin aspart  0-5 Units Subcutaneous QHS  . ipratropium-albuterol  3 mL Nebulization QID  . levofloxacin  500 mg Oral Daily  . lisinopril  5 mg Oral Daily  . methylPREDNISolone (SOLU-MEDROL) injection  60 mg Intravenous 3 times per day  . morphine  60 mg Oral Q12H  . nicotine  14 mg Transdermal Daily  . pantoprazole  40 mg Oral BID AC  . polyethylene glycol  17 g Oral BID  . senna  1 tablet Oral BID   Continuous Infusions:    Assessment and plan:  Principal Problem:   Abdominal pain, epigastric Active Problems:   Peritoneal carcinomatosis   Elevated LFTs   COPD exacerbation   CAD (coronary artery disease)   Family history of colon cancer   Cholelithiasis   Colon polyps   Gastritis   Alcoholic cirrhosis of liver without ascites   Hepatitis C antibody test positive   Constipation   History of ETOH abuse   Obesity, unspecified   History of splenectomy   Tobacco abuse   Hiatal hernia    1. Abdominal pain, likely secondary to presumed peritoneal carcinomatosis +/-gastritis seen on the EGD.  CT of his abdomen and pelvis was suggestive of a possible gastric or GE mass, however, there was no gastric mass seen per Dr. Oneida Alar, but rather a hiatal hernia and mild gastritis on the EGD on 9/19. Await biopsies results. Dilaudid was was discontinued by oncology and MS Contin was started. Continue Protonix. His diet was advanced which he is tolerating well.  Presumed peritoneal carcinomatosis per CT of the abdomen and pelvis. Per colonoscopy by Dr. Oneida Alar on 9/20, there was no evidence of colon cancer, but the patient had redundant colon and 4 polyps. Colonoscopy performed because of family history of colon cancer in his brother and worsening constipation. Will continue laxative therapy. CEA ordered on admission was within normal limits at 0.9 and CEA 19-9 was elevated at 12.4. Oncology was consulted and ordered CEA 125. Their assessment noted and appreciated. We'll await their discussion with oncology surgeon. CT scan of the chest ordered to complete staging workup.  Elevated LFTs/Cirrhosis per imaging studies/Hepatitis C antibody positive. The patient has a history of alcohol abuse. He reports stopping in January of 2015, but admitted having 1 beer last week. Essentially, he no longer drinks. He also has gallstones noted on the CT. viral hepatitis panel ordered and revealed a positive hepatitis C antibody. (Have not  discussed this with the patient yet). We'll order HCV quantitative. The elevated liver transaminases could be secondary to hepatitis C, cholelithiasis, and alcohol abuse. He does not appear to have sequelae of end-stage cirrhosis. Constipation. Status post GoLYTELY for colonoscopy. We'll continue MiraLax and Senokot S. twice a day.  Reported history of CAD and hypertension. The patient denies having a heart attack, but was told he had significant coronary artery disease. He has not had evaluation with cardiac catheterization. He denies any history of congestive heart failure. He had been treated with metoprolol and lisinopril in the past for hypertension, but was unable to afford medications. HCTZ discontinued following admission. Lisinopril was started and will be titrated up to 5 mg daily. We'll hold off on Toprol because of the bronchospasms. Will hold aspirin  for now. His initial troponin I was negative. We'll order a 2-D echocardiogram to assess his EF because of chronic shortness of breath. COPD with exacerbation. The patient has a long history of smoking one pack of cigarettes per day, but he stopped a few days before hospital admission. Continue nicotine replacement therapy. Continue Levaquin and Solu-Medrol and nebulizers. We'll taper down Solu-Medrol accordingly. Hyperglycemia.  Likely steroid induced. His hemoglobin A1c was 5.8. We'll Continue sliding scale NovoLog on steroid therapy. Obesity. Carbohydrate modified/heart healthy diet ordered. TSH is pending.  Leukocytosis. This is likely secondary to steroid therapy. We'll continue to follow. Chronic deconditioning. The patient appears to be chronically deconditioned 48 year old.Burnis Medin order PT evaluation. We'll order 2-D echocardiogram to assess EF.    Time spent: 35 minutes.    [facility] Pager [phone]. If 7PM-7AM, please contact night-coverage at [email or web address], password [facility] 04/18/2014, 12:20 PM   LOS: 3 days

## 2014-04-18 NOTE — Progress Notes (Signed)
An attempt was made to see pt for evaluation.  He was found sitting at bedside in tears secondary to intense abdominal pain.  RN was aware and was in the process of administering pain med.  We will defer PT eval until pain is decreased.

## 2014-04-19 ENCOUNTER — Encounter (HOSPITAL_COMMUNITY): Payer: Self-pay

## 2014-04-19 ENCOUNTER — Ambulatory Visit (HOSPITAL_COMMUNITY)
Admit: 2014-04-19 | Discharge: 2014-04-19 | Disposition: A | Discharge status: Home / Self Care | Payer: Self-pay | Attending: Oncology | Admitting: Oncology

## 2014-04-19 DIAGNOSIS — I517 Cardiomegaly: Secondary | ICD-10-CM

## 2014-04-19 LAB — GLUCOSE, CAPILLARY
GLUCOSE-CAPILLARY: 143 mg/dL — AB (ref 70–99)
GLUCOSE-CAPILLARY: 123 mg/dL — AB (ref 70–99)
GLUCOSE-CAPILLARY: 149 mg/dL — AB (ref 70–99)
Glucose-Capillary: 124 mg/dL — ABNORMAL HIGH (ref 70–99)

## 2014-04-19 LAB — CBC
HCT: 48 % (ref 39.0–52.0)
Hemoglobin: 15.6 g/dL (ref 13.0–17.0)
MCH: 32 pg (ref 26.0–34.0)
MCHC: 32.5 g/dL (ref 30.0–36.0)
MCV: 98.4 fL (ref 78.0–100.0)
PLATELETS: 231 10*3/uL (ref 150–400)
RBC: 4.88 MIL/uL (ref 4.22–5.81)
RDW: 15.3 % (ref 11.5–15.5)
WBC: 19.5 10*3/uL — ABNORMAL HIGH (ref 4.0–10.5)

## 2014-04-19 MED ORDER — FENTANYL CITRATE 0.05 MG/ML IJ SOLN
INTRAMUSCULAR | Status: AC | PRN
  Administered 2014-04-19 (×3): 50 ug via INTRAVENOUS

## 2014-04-19 MED ORDER — FENTANYL CITRATE 0.05 MG/ML IJ SOLN
INTRAMUSCULAR | Status: AC
  Filled 2014-04-19: qty 4

## 2014-04-19 MED ORDER — MIDAZOLAM HCL 2 MG/2ML IJ SOLN
INTRAMUSCULAR | Status: AC | PRN
  Administered 2014-04-19 (×3): 1 mg via INTRAVENOUS

## 2014-04-19 MED ORDER — MIDAZOLAM HCL 2 MG/2ML IJ SOLN
INTRAMUSCULAR | Status: AC
  Filled 2014-04-19: qty 4

## 2014-04-19 MED ORDER — HYDROMORPHONE HCL 1 MG/ML IJ SOLN
INTRAMUSCULAR | Status: AC
  Filled 2014-04-19: qty 1

## 2014-04-19 MED ORDER — SODIUM CHLORIDE 0.9 % IV SOLN
INTRAVENOUS | Status: AC | PRN
  Administered 2014-04-19: 10 mL/h via INTRAVENOUS

## 2014-04-19 MED ORDER — ZOLPIDEM TARTRATE 5 MG PO TABS
5.0000 mg | ORAL_TABLET | Freq: Once | ORAL | Status: AC
  Administered 2014-04-19: 5 mg via ORAL
  Filled 2014-04-19: qty 1

## 2014-04-19 MED ORDER — OXYCODONE HCL 5 MG PO TABS
ORAL_TABLET | ORAL | Status: AC
  Administered 2014-04-19: 10 mg via ORAL
  Filled 2014-04-19: qty 2

## 2014-04-19 MED ORDER — HYDROMORPHONE HCL PF 1 MG/ML IJ SOLN
INTRAMUSCULAR | Status: AC | PRN
  Administered 2014-04-19: 1 mg via INTRAVENOUS

## 2014-04-19 NOTE — Sedation Documentation (Signed)
Vital signs stable. SpO2 95% on RA

## 2014-04-19 NOTE — Procedures (Signed)
Interventional Radiology Procedure Note  Procedure: Technically challenging biopsy.  Pelvic mass freely mobile and therefore very difficult to target as it moved away from needle.  Multiple needle repositioning required to attain samples.   Complications: None immediate.  Multiple needle repositionings near rectum. Recommendations: - Bedrest x 2 hrs - Path pending - Consider prophylactic abx (zosyn or cipro/flagyl)   Signed,  Criselda Peaches, MD

## 2014-04-19 NOTE — Progress Notes (Signed)
Patient requested a diet order after returning from procedure at [facility] today. Dr. Caryn Section was notified and she asked for confirmation from Dr. Laurence Ferrari to give OK for diet order. Called IR at [facility] multiple times this afternoon and was redirected to the PA working with Dr. Laurence Ferrari. Paged PA, but unable to get a response. Attempted page a second time. Dr. Laurence Ferrari also paged twice, but was unable to get a response. Dr. Caryn Section notified of situation. Order a full liquid diet for today, and said that diet should be reevaluated in the morning. Notified RN taking over care for patient this afternoon.

## 2014-04-19 NOTE — Progress Notes (Signed)
  Echocardiogram 2D Echocardiogram has been performed.  [place], [place] 04/19/2014, 3:31 PM

## 2014-04-19 NOTE — Progress Notes (Signed)
[facility] PROGRESS NOTE  Rook Maue VOZ:[id number] DOB: 05/02/1966 DOA: 04/15/2014 PCP: No PCP Per Patient    Code Status: Full code Family Communication: Discussed with patient, family not available. Disposition Plan: Discharge when clinically appropriate.   Consultants:  IR  Gastroenterology, Dr.Fields  Oncology  Procedures:  04/19/14: Omental/peritoneal mass biopsy, pending  04/17/14: Colonoscopy per Dr. Doristine Mango left colon; 4 colon polyps; internal hemorrhoids.    04/16/14: EGD per Dr. Pennie Banter hernia; gastritis; small amount of retained food in the stomach; no source for metastatic disease identified; biopsy pending.  Antibiotics:  Levaquin on 9/19>>  HPI/Subjective: The patient is sitting up in bed, getting ready to be transferred to [facility] for the biopsy. He had a decent night. No complaints of abdominal pain currently. No complaints of chest pain or shortness of breath.  Objective: Filed Vitals:   04/19/14 0802  BP: 137/92  Pulse: 66  Temp: 97.6 F (36.4 C)  Resp: 22   Oxygen saturation 95% on oxygen supplementation.   Intake/Output Summary (Last 24 hours) at 04/19/14 0828 Last data filed at 04/18/14 1700  Gross per 24 hour  Intake   1080 ml  Output      0 ml  Net   1080 ml   Filed Weights   04/15/14 1633 04/15/14 2158 04/18/14 0545  Weight: 127.007 kg (280 lb) 123.2 kg (271 lb 9.7 oz) 126.9 kg (279 lb 12.2 oz)    Exam:   General: Ill-appearing obese [name] inno acute distress.  Cardiovascular: S1, S2, with a soft systolic murmur.  Respiratory: fine bilateral expiratory  wheezes and and occasional  crackles auscultated bilaterally. Breathing nonlabored.  Abdomen: Obese, positive bowel sounds, decreased  tender in the epigastrium and the hypogastrium; no appreciable distention or masses palpated.  Musculoskeletal: No acute hot joints. Pedal pulses palpable. Trace of pedal edema.  Neuropsychiatric:  He is alert and oriented x3. He is Slightly anxious regarding his abdominal findings.  Cranial nerves II through XII are grossly intact.   Data Reviewed: Basic Metabolic Panel:  Recent Labs Lab 04/15/14 1640 04/15/14 2133 04/16/14 0549 04/17/14 0545  NA 140  --  138 141  K 4.3  --  4.3 3.9  CL 105  --  103 101  CO2 22  --  24 27  GLUCOSE 122*  --  149* 135*  BUN 11  --  10 8  CREATININE 0.89 0.87 0.83 0.74  CALCIUM 9.0  --  8.6 8.4   Liver Function Tests:  Recent Labs Lab 04/15/14 1640 04/16/14 0549 04/17/14 0545  AST 119* 106* 73*  ALT 143* 144* 124*  ALKPHOS 65 57 52  BILITOT 0.3 0.3 0.3  PROT 7.3 7.1 7.0  ALBUMIN 3.7 3.5 3.7    Recent Labs Lab 04/15/14 1640  LIPASE 72*   No results found for this basename: AMMONIA,  in the last 168 hours CBC:  Recent Labs Lab 04/15/14 1640 04/15/14 2133 04/16/14 0549 04/17/14 0545 04/18/14 0723 04/19/14 0559  WBC 13.3* 14.5* 7.7 19.5* 19.2* 19.5*  NEUTROABS 6.4  --   --   --   --   --   HGB 17.4* 16.6 16.7 15.4 15.5 15.6  HCT 50.7 49.0 50.5 47.5 46.6 48.0  MCV 96.4 95.9 98.2 98.3 97.9 98.4  PLT 221 227 225 234 228 231   Cardiac Enzymes:  Recent Labs Lab 04/15/14 1640  TROPONINI <0.30   BNP (last 3 results) No results found for this basename: PROBNP,  in the  last 8760 hours CBG:  Recent Labs Lab 04/18/14 0743 04/18/14 1200 04/18/14 1630 04/18/14 2127 04/19/14 0749  GLUCAP 123* 159* 120* 162* 124*    No results found for this or any previous visit (from the past 240 hour(s)).   Studies: Ct Chest W Contrast  04/18/2014   CLINICAL DATA:  Shortness of breath. Recently diagnosed with abdominal mass.  EXAM: CT CHEST WITH CONTRAST  TECHNIQUE: Multidetector CT imaging of the chest was performed during intravenous contrast administration.  CONTRAST:  11mL OMNIPAQUE IOHEXOL 300 MG/ML  SOLN  COMPARISON:  Plain films 04/15/2014. Abdominal pelvic CT 04/15/2014.  FINDINGS: Lungs/Pleura: Minimal subpleural right  upper lobe density, including on image 14. Favored to represent subsegmental atelectasis. Bibasilar scarring.  Trace bilateral pleural thickening.  Heart/Mediastinum: No supraclavicular adenopathy. Small bilateral axillary nodes. Bovine arch. Normal heart size, without pericardial effusion. No hilar adenopathy. Mild retrocrural adenopathy. Example 8 mm on image 54. Borderline periesophageal adenopathy at 8 mm on image 27.  Upper Abdomen: Mild cirrhosis as evidenced by enlargement of the caudate and lateral segment left liver lobes. Prior splenectomy. Prominent right upper quadrant lymph nodes, likely reactive. Normal imaged portions of the gallbladder, pancreas, adrenal glands, kidneys.  Bones/Musculoskeletal: No acute osseous abnormality. Schmorl's node deformities involving the lower thoracic spine.  IMPRESSION: 1. No evidence of primary malignancy or metastatic disease within the chest. 2. Mild retrocrural adenopathy with borderline periesophageal adenopathy. Favored to be reactive. 3. Cirrhosis with likely secondary prominent upper abdominal nodes, incompletely imaged.   Electronically Signed   By: Abigail Miyamoto M.D.   On: 04/18/2014 16:20    Scheduled Meds: . heparin  5,000 Units Subcutaneous 3 times per day  .  HYDROmorphone (DILAUDID) injection  0.5 mg Intravenous TID  . insulin aspart  0-15 Units Subcutaneous TID WC  . insulin aspart  0-5 Units Subcutaneous QHS  . ipratropium-albuterol  3 mL Nebulization QID  . levofloxacin  500 mg Oral Daily  . lisinopril  5 mg Oral Daily  . methylPREDNISolone (SOLU-MEDROL) injection  60 mg Intravenous Q12H  . morphine  60 mg Oral Q12H  . nicotine  14 mg Transdermal Daily  . pantoprazole  40 mg Oral BID AC  . polyethylene glycol  17 g Oral BID  . senna  1 tablet Oral BID   Continuous Infusions:   Assessment and plan:  Principal Problem:   Abdominal pain, epigastric Active Problems:   Peritoneal carcinomatosis   Elevated LFTs   COPD exacerbation    CAD (coronary artery disease)   Family history of colon cancer   Cholelithiasis   Colon polyps   Gastritis   Alcoholic cirrhosis of liver without ascites   Hepatitis C antibody test positive   Constipation   History of ETOH abuse   Obesity, unspecified   History of splenectomy   Tobacco abuse   Hiatal hernia   Brief history:  The patient is a 48 year old man who is relocating here from [place], with a history of coronary artery disease, COPD/asthma, and hypertension, who presented to the emergency department on 04/15/2014 with a chief complaint of persistent abdominal pain and secondarily shortness of breath and chest congestion. CT scan of the abdomen and pelvis revealed peritoneal/omental disease concerning for malignancy. It also revealed a possible GE/gastric mass. Gastroenterology was consulted. Workup revealed no gastric mass, but a hiatal hernia and gastritis-biopsies taken and are pending. the colonoscopy revealed colon polyps-not biopsied. Oncology consulted and ordered IR to biopsy one of the omental masses. This is  being done today on 9/22. In the meantime, the patient has had severe abdominal pain which is now being treated mostly with oral opiates. For workup of his elevated LFTs, he was noted to have cirrhosis on the CT which was thought to be secondary to alcohol, but now could be secondary to chronic hepatitis C as his HCV antibody was positive. Quantitative HCV is pending. He is being treated for COPD exacerbation which is improving. The patient will likely be hospitalized for the next 24-48 hours awaiting the omental biopsy results for definitive disposition and followup per oncology.    1. Abdominal pain, likely secondary to presumed peritoneal carcinomatosis +/-gastritis seen on the EGD.  CT of his abdomen and pelvis was suggestive of a possible gastric or GE mass, however, there was no gastric mass seen per Dr. Oneida Alar, but rather a hiatal hernia and mild gastritis on  the EGD on 9/19. Await biopsies results. Dilaudid was was discontinued by oncology and MS Contin was started. Continue Protonix. His diet was advanced which he is tolerating well.  Presumed peritoneal carcinomatosis/metastatic disease of unknown etiology per CT of the abdomen and pelvis. Per colonoscopy by Dr. Oneida Alar on 9/20, there was no evidence of colon cancer, but the patient had redundant colon and 4 polyps. Colonoscopy performed because of family history of colon cancer in his brother and worsening constipation. Will continue laxative therapy. CEA ordered on admission was within normal limits at 0.9 and CEA 19-9 was elevated at 12.4. Oncology was consulted and ordered CEA 125. They discussed the patient's case with oncology surgeon at [facility], but it was recommended that percutaneous biopsy be done instead. The patient is getting the biopsy by IR at [facility] today. CT of the chest for further staging revealed no evidence of metastatic disease. Their assessment noted and appreciated.  Elevated LFTs/Cirrhosis per imaging studies/Hepatitis C antibody positive. The patient has a history of alcohol abuse. He reports stopping in January of 2015, but admitted having 1 beer last week. Essentially, he no longer drinks. He also has gallstones noted on the CT. viral hepatitis panel ordered and revealed a positive hepatitis C antibody. (Have not discussed this with the patient yet). We'll order HCV quantitative. The elevated liver transaminases could be secondary to hepatitis C, cholelithiasis, and alcohol abuse. He does not appear to have sequelae of end-stage cirrhosis. Constipation. Status post GoLYTELY for colonoscopy. We'll continue MiraLax and Senokot S. twice a day.  Reported history of CAD and hypertension. The patient denies having a heart attack, but was told he had significant coronary artery disease. He has not had evaluation with cardiac catheterization. He denies any history of congestive heart failure. He  had been treated with metoprolol and lisinopril in the past for hypertension, but was unable to afford medications. HCTZ discontinued following admission. Lisinopril was started and will be titrated up to 5 mg daily. We'll hold off on Toprol because of the bronchospasms. Will hold aspirin for now. His initial troponin I was negative. 2-D echocardiogram pending to assess his EF because of chronic shortness of breath. COPD with exacerbation. The patient has a long history of smoking one pack of cigarettes per day, but he stopped a few days before hospital admission. Continue nicotine replacement therapy. Continue Levaquin and Solu-Medrol and nebulizers. He was given 1 dose of Mucomyst nebulizer. We'll taper down Solu-Medrol accordingly. Hyperglycemia.  Likely steroid induced. His hemoglobin A1c was 5.8. We'll Continue sliding scale NovoLog on steroid therapy. Obesity. Carbohydrate modified/heart healthy diet ordered. TSH is  slightly low. Recommend followup TSH in 3-6 months.  Leukocytosis. This is likely secondary to steroid therapy. We'll continue to follow. Mildly low TSH. No treatment or further workup needed at this point. Would recommend followup TSH in 3-6 months. Chronic deconditioning. The patient appears to be chronically deconditioned 48 year old.Burnis Medin order PT evaluation. We'll order 2-D echocardiogram to assess EF.    Time spent: 25 minutes.    [facility] Pager [phone]. If 7PM-7AM, please contact night-coverage at [email or web address], password [facility] 04/19/2014, 8:28 AM  LOS: 4 days

## 2014-04-19 NOTE — Progress Notes (Signed)
Spoke with patient briefly just prior to transfer to [place]. States pain significantly improved with Dilaudid, voiced appreciation to Dr. Oneida Alar for adjusting medication for pain control. Will follow peripherally; follow-up on biopsies from Colonoscopy/EGD. As of note, Hep C antibody positive, with HCV RNA in process.   Orvil Feil, ANP-BC [facility]

## 2014-04-19 NOTE — Progress Notes (Signed)
O2 saturations per care order/instruction: 96% on room air today.

## 2014-04-19 NOTE — Sedation Documentation (Signed)
Pt rates pain 9/10. MD ordered 1mg  dilaudid IV once in recovery area.

## 2014-04-19 NOTE — Consult Note (Signed)
HPI: Jeremy Mcgee is an 48 y.o. male who is transported down to [facility] from [facility] for biopsy of peritoneal lesion. Chart, PMHx, imaging, labs reviewed. 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 Approved for CT guided biopsy today. Pt has been NPO this am  Past Medical History:  Past Medical History  Diagnosis Date  . Hypertension   . Coronary artery disease   . Asthma   . COPD (chronic obstructive pulmonary disease)     emphysema  . Abdominal trauma   . 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  . Colon polyps 04/17/2014  . Gastritis 04/17/2014  . Hiatal hernia 04/17/2014    Could have been possible "gastric mass" on CT  . Alcoholic cirrhosis of liver without ascites 04/18/2014  . Hepatitis C antibody test positive 04/18/2014    Surgical History:  Past Surgical History  Procedure Laterality Date  . Splenectomy  1974  . Tonsillectomy    . Esophagogastroduodenoscopy N/A 04/16/2014    Procedure: ESOPHAGOGASTRODUODENOSCOPY (EGD);  Surgeon: Danie Binder, MD;  Location: [facility];  Service: Endoscopy;  Laterality: N/A;  . Colonoscopy N/A 04/17/2014    Procedure: COLONOSCOPY;  Surgeon: Danie Binder, MD;  Location: [facility];  Service: Endoscopy;  Laterality: N/A;    Family History:  Family History  Problem Relation Age of Onset  . Cancer Maternal Grandmother     lung  . Leukemia Maternal Uncle   . Colon cancer Brother     Familial polyposis?  . Stomach cancer Maternal Aunt   . Hypertension Maternal Grandfather   . Heart attack Brother 78  . Stroke Brother 52    Social History:  reports that he has been smoking Cigarettes.  He has been smoking about 1.00 pack per day. He does not have any smokeless tobacco history on file. He reports that he does not drink alcohol or use illicit drugs.  Allergies:  Allergies  Allergen Reactions  . Penicillins Hives    Medications: No current facility-administered medications for this encounter. No current  outpatient prescriptions on file. Facility-Administered Medications Ordered in Other Encounters: acetaminophen (TYLENOL) suppository 650 mg, 650 mg, Rectal, Q6H PRN, Waldemar Dickens, MD;  acetaminophen (TYLENOL) tablet 650 mg, 650 mg, Oral, Q6H PRN, Waldemar Dickens, MD, 650 mg at 04/18/14 1815;  albuterol (PROVENTIL) (2.5 MG/3ML) 0.083% nebulizer solution 2.5 mg, 2.5 mg, Nebulization, Q4H PRN, Rexene Alberts, MD, 2.5 mg at 04/19/14 0050 heparin injection 5,000 Units, 5,000 Units, Subcutaneous, 3 times per day, Lavonia Drafts, PA-C, 5,000 Units at 04/18/14 2141;  HYDROmorphone (DILAUDID) injection 0.5 mg, 0.5 mg, Intravenous, TID, Danie Binder, MD, 0.5 mg at 04/18/14 2140;  insulin aspart (novoLOG) injection 0-15 Units, 0-15 Units, Subcutaneous, TID WC, Rexene Alberts, MD, 2 Units at 04/19/14 0820 insulin aspart (novoLOG) injection 0-5 Units, 0-5 Units, Subcutaneous, QHS, Rexene Alberts, MD;  ipratropium-albuterol (DUONEB) 0.5-2.5 (3) MG/3ML nebulizer solution 3 mL, 3 mL, Nebulization, QID, Rexene Alberts, MD, 3 mL at 04/19/14 0741;  levofloxacin (LEVAQUIN) tablet 500 mg, 500 mg, Oral, Daily, Rexene Alberts, MD, 500 mg at 04/18/14 1200;  lisinopril (PRINIVIL,ZESTRIL) tablet 5 mg, 5 mg, Oral, Daily, Rexene Alberts, MD, 5 mg at 04/18/14 0839 methylPREDNISolone sodium succinate (SOLU-MEDROL) 125 mg/2 mL injection 60 mg, 60 mg, Intravenous, Q12H, Rexene Alberts, MD, 60 mg at 04/19/14 0505;  morphine (MS CONTIN) 12 hr tablet 60 mg, 60 mg, Oral, Q12H, Baird Cancer, PA-C, 60 mg at 04/18/14 2141;  nicotine (  NICODERM CQ - dosed in mg/24 hours) patch 14 mg, 14 mg, Transdermal, Daily, Rexene Alberts, MD, 14 mg at 04/18/14 0840 ondansetron [facility]) injection 4 mg, 4 mg, Intravenous, Q6H PRN, Waldemar Dickens, MD, 4 mg at 04/17/14 2204;  ondansetron [facility]) tablet 4 mg, 4 mg, Oral, Q6H PRN, Waldemar Dickens, MD;  oxazepam [facility]) capsule 15 mg, 15 mg, Oral, QID PRN, Orvan Falconer, MD, 15 mg at 04/18/14 2141;  oxyCODONE (Oxy  IR/ROXICODONE) immediate release tablet 5-10 mg, 5-10 mg, Oral, Q4H PRN, Baird Cancer, PA-C, 10 mg at 04/19/14 0646 pantoprazole (PROTONIX) EC tablet 40 mg, 40 mg, Oral, BID AC, Danie Binder, MD, 40 mg at 04/19/14 0820;  polyethylene glycol (MIRALAX / GLYCOLAX) packet 17 g, 17 g, Oral, BID, Waldemar Dickens, MD, 17 g at 04/18/14 2140;  senna (SENOKOT) tablet 8.6 mg, 1 tablet, Oral, BID, Rexene Alberts, MD, 8.6 mg at 04/18/14 2141  ROS: See HPI for pertinent findings, otherwise complete 10 system review negative.  Physical Exam: T: 97.6, HR: 66, BP: 137/92, RR: 22 ENT: unremarkable airway Lungs: CTA without w/r/r Heart: Regular     Labs: Results for orders placed during the hospital encounter of 04/15/14 (from the past 48 hour(s))  GLUCOSE, CAPILLARY     Status: Abnormal   Collection Time    04/17/14 11:26 AM      Result Value Ref Range   Glucose-Capillary 113 (*) 70 - 99 mg/dL  CEA     Status: None   Collection Time    04/17/14 11:35 AM      Result Value Ref Range   CEA 0.6  0.0 - 5.0 ng/mL   Comment: Performed at [place], CAPILLARY     Status: Abnormal   Collection Time    04/17/14  4:44 PM      Result Value Ref Range   Glucose-Capillary 199 (*) 70 - 99 mg/dL   Comment 1 Notify RN    GLUCOSE, CAPILLARY     Status: Abnormal   Collection Time    04/17/14  8:24 PM      Result Value Ref Range   Glucose-Capillary 142 (*) 70 - 99 mg/dL   Comment 1 Notify RN     Comment 2 Documented in Chart    CBC     Status: Abnormal   Collection Time    04/18/14  7:23 AM      Result Value Ref Range   WBC 19.2 (*) 4.0 - 10.5 K/uL   RBC 4.76  4.22 - 5.81 MIL/uL   Hemoglobin 15.5  13.0 - 17.0 g/dL   HCT 46.6  39.0 - 52.0 %   MCV 97.9  78.0 - 100.0 fL   MCH 32.6  26.0 - 34.0 pg   MCHC 33.3  30.0 - 36.0 g/dL   RDW 15.2  11.5 - 15.5 %   Platelets 228  150 - 400 K/uL  GLUCOSE, CAPILLARY     Status: Abnormal   Collection Time    04/18/14  7:43 AM      Result  Value Ref Range   Glucose-Capillary 123 (*) 70 - 99 mg/dL   Comment 1 Notify RN    GLUCOSE, CAPILLARY     Status: Abnormal   Collection Time    04/18/14 12:00 PM      Result Value Ref Range   Glucose-Capillary 159 (*) 70 - 99 mg/dL   Comment 1 Notify RN    [employer]  Status: None   Collection Time    04/18/14  3:57 PM      Result Value Ref Range   Prothrombin Time 13.5  11.6 - 15.2 seconds   INR 1.03  0.00 - 1.49  APTT     Status: None   Collection Time    04/18/14  3:57 PM      Result Value Ref Range   aPTT 25  24 - 37 seconds  GLUCOSE, CAPILLARY     Status: Abnormal   Collection Time    04/18/14  4:30 PM      Result Value Ref Range   Glucose-Capillary 120 (*) 70 - 99 mg/dL  GLUCOSE, CAPILLARY     Status: Abnormal   Collection Time    04/18/14  9:27 PM      Result Value Ref Range   Glucose-Capillary 162 (*) 70 - 99 mg/dL   Comment 1 Documented in Chart     Comment 2 Notify RN    CBC     Status: Abnormal   Collection Time    04/19/14  5:59 AM      Result Value Ref Range   WBC 19.5 (*) 4.0 - 10.5 K/uL   RBC 4.88  4.22 - 5.81 MIL/uL   Hemoglobin 15.6  13.0 - 17.0 g/dL   HCT 48.0  39.0 - 52.0 %   MCV 98.4  78.0 - 100.0 fL   MCH 32.0  26.0 - 34.0 pg   MCHC 32.5  30.0 - 36.0 g/dL   RDW 15.3  11.5 - 15.5 %   Platelets 231  150 - 400 K/uL  GLUCOSE, CAPILLARY     Status: Abnormal   Collection Time    04/19/14  7:49 AM      Result Value Ref Range   Glucose-Capillary 124 (*) 70 - 99 mg/dL   Comment 1 Notify RN      PT/INR  Recent Labs  04/18/14 1557  LABPROT 13.5  INR 1.03       Ct Chest W Contrast  04/18/2014   CLINICAL DATA:  Shortness of breath. Recently diagnosed with abdominal mass.  EXAM: CT CHEST WITH CONTRAST  TECHNIQUE: Multidetector CT imaging of the chest was performed during intravenous contrast administration.  CONTRAST:  35mL OMNIPAQUE IOHEXOL 300 MG/ML  SOLN  COMPARISON:  Plain films 04/15/2014. Abdominal pelvic CT 04/15/2014.  FINDINGS:  Lungs/Pleura: Minimal subpleural right upper lobe density, including on image 14. Favored to represent subsegmental atelectasis. Bibasilar scarring.  Trace bilateral pleural thickening.  Heart/Mediastinum: No supraclavicular adenopathy. Small bilateral axillary nodes. Bovine arch. Normal heart size, without pericardial effusion. No hilar adenopathy. Mild retrocrural adenopathy. Example 8 mm on image 54. Borderline periesophageal adenopathy at 8 mm on image 27.  Upper Abdomen: Mild cirrhosis as evidenced by enlargement of the caudate and lateral segment left liver lobes. Prior splenectomy. Prominent right upper quadrant lymph nodes, likely reactive. Normal imaged portions of the gallbladder, pancreas, adrenal glands, kidneys.  Bones/Musculoskeletal: No acute osseous abnormality. Schmorl's node deformities involving the lower thoracic spine.  IMPRESSION: 1. No evidence of primary malignancy or metastatic disease within the chest. 2. Mild retrocrural adenopathy with borderline periesophageal adenopathy. Favored to be reactive. 3. Cirrhosis with likely secondary prominent upper abdominal nodes, incompletely imaged.   Electronically Signed   By: Abigail Miyamoto M.D.   On: 04/18/2014 16:20    Assessment/Plan: Peritoneal lesions For CT guided biopsy today Discussed procedure, risks, complications, use of sedation. Labs reviewed Consent signed in chart  Ascencion Dike PA-C 04/19/2014, 9:24 AM

## 2014-04-19 NOTE — Progress Notes (Signed)
REVIEWED.  

## 2014-04-20 LAB — HCV RNA QUANT
HCV QUANT LOG: 5.71 {Log} — AB (ref <1.18)
HCV Quantitative: 509974 IU/mL — ABNORMAL HIGH (ref <15)

## 2014-04-20 LAB — HEPATIC FUNCTION PANEL
ALK PHOS: 53 U/L (ref 39–117)
ALT: 196 U/L — AB (ref 0–53)
AST: 128 U/L — AB (ref 0–37)
Albumin: 3.6 g/dL (ref 3.5–5.2)
Bilirubin, Direct: <0.2 mg/dL (ref 0.0–0.3)
TOTAL PROTEIN: 7 g/dL (ref 6.0–8.3)
Total Bilirubin: 0.3 mg/dL (ref 0.3–1.2)

## 2014-04-20 LAB — GLUCOSE, CAPILLARY
GLUCOSE-CAPILLARY: 141 mg/dL — AB (ref 70–99)
GLUCOSE-CAPILLARY: 183 mg/dL — AB (ref 70–99)
Glucose-Capillary: 119 mg/dL — ABNORMAL HIGH (ref 70–99)
Glucose-Capillary: 132 mg/dL — ABNORMAL HIGH (ref 70–99)

## 2014-04-20 MED ORDER — HYDROMORPHONE HCL 1 MG/ML IJ SOLN
0.5000 mg | Freq: Once | INTRAMUSCULAR | Status: AC
  Administered 2014-04-20: 0.5 mg via INTRAVENOUS
  Filled 2014-04-20: qty 1

## 2014-04-20 MED ORDER — PREDNISONE 20 MG PO TABS
40.0000 mg | ORAL_TABLET | Freq: Every day | ORAL | Status: DC
  Administered 2014-04-21 – 2014-04-22 (×2): 40 mg via ORAL
  Filled 2014-04-20 (×2): qty 2

## 2014-04-20 MED ORDER — METRONIDAZOLE 500 MG PO TABS: 500.0000 mg | ORAL_TABLET | Freq: Four times a day (QID) | ORAL | Status: DC

## 2014-04-20 MED ORDER — MAGNESIUM HYDROXIDE 400 MG/5ML PO SUSP
30.0000 mL | Freq: Every day | ORAL | Status: DC | PRN
  Administered 2014-04-20 – 2014-04-21 (×2): 30 mL via ORAL
  Filled 2014-04-20 (×2): qty 30

## 2014-04-20 MED ORDER — BIS SUBCIT-METRONID-TETRACYC 140-125-125 MG PO CAPS
3.0000 | ORAL_CAPSULE | Freq: Three times a day (TID) | ORAL | Status: DC
  Administered 2014-04-21 (×3): 3 via ORAL
  Filled 2014-04-20 (×40): qty 3

## 2014-04-20 MED ORDER — ALPRAZOLAM 0.25 MG PO TABS
0.2500 mg | ORAL_TABLET | Freq: Three times a day (TID) | ORAL | Status: DC | PRN
  Administered 2014-04-20 – 2014-04-22 (×5): 0.25 mg via ORAL
  Filled 2014-04-20 (×5): qty 1

## 2014-04-20 MED ORDER — ZOLPIDEM TARTRATE 5 MG PO TABS
10.0000 mg | ORAL_TABLET | Freq: Every evening | ORAL | Status: DC | PRN
  Administered 2014-04-21: 10 mg via ORAL
  Filled 2014-04-20: qty 2

## 2014-04-20 MED ORDER — BISMUTH SUBSALICYLATE 262 MG/15ML PO SUSP
30.0000 mL | Freq: Four times a day (QID) | ORAL | Status: DC
  Filled 2014-04-20: qty 236

## 2014-04-20 MED ORDER — TETRACYCLINE HCL 250 MG PO CAPS
375.0000 mg | ORAL_CAPSULE | Freq: Four times a day (QID) | ORAL | Status: DC
  Filled 2014-04-20 (×2): qty 2

## 2014-04-20 MED ORDER — MILK AND MOLASSES ENEMA
1.0000 | Freq: Once | RECTAL | Status: AC
  Administered 2014-04-20: 250 mL via RECTAL

## 2014-04-20 NOTE — Progress Notes (Signed)
Subjective: Lower abdominal pain intermittently, no aggravating factors. Notes relief with Dilaudid dosing that was scheduled on Monday. No N/V. Wants to advance diet. Unaware that he had Hep C.   Objective: Vital signs in last 24 hours: Temp:  [98.2 F (36.8 C)-99.1 F (37.3 C)] 98.6 F (37 C) (09/23 0555) Pulse Rate:  [65-90] 69 (09/23 0555) Resp:  [8-24] 20 (09/23 0555) BP: (118-151)/(74-112) 138/74 mmHg (09/23 0849) SpO2:  [92 %-98 %] 94 % (09/23 0713) Last BM Date: 04/17/14 General:   Alert and oriented, pleasant Head:  Normocephalic and atraumatic. Eyes:  No icterus, sclera clear. Conjuctiva pink.  Abdomen:  Bowel sounds present,obese, TTP diffusely lower abdomen Msk:  Symmetrical without gross deformities. Normal posture. Extremities:  Without  edema. Neurologic:  Alert and  oriented x4;  grossly normal neurologically. Skin:  Warm and dry, intact without significant lesions.  Psych:  Alert and cooperative. Tearful  Intake/Output from previous day: 09/22 0701 - 09/23 0700 In: 480 [P.O.:480] Out: 2600 [Urine:2600] Intake/Output this shift:    Lab Results:  Recent Labs  04/18/14 0723 04/19/14 0559  WBC 19.2* 19.5*  HGB 15.5 15.6  HCT 46.6 48.0  PLT 228 231   PT/INR  Recent Labs  04/18/14 1557  LABPROT 13.5  INR 1.03     Studies/Results: Ct Chest W Contrast  04/18/2014   CLINICAL DATA:  Shortness of breath. Recently diagnosed with abdominal mass.  EXAM: CT CHEST WITH CONTRAST  TECHNIQUE: Multidetector CT imaging of the chest was performed during intravenous contrast administration.  CONTRAST:  71mL OMNIPAQUE IOHEXOL 300 MG/ML  SOLN  COMPARISON:  Plain films 04/15/2014. Abdominal pelvic CT 04/15/2014.  FINDINGS: Lungs/Pleura: Minimal subpleural right upper lobe density, including on image 14. Favored to represent subsegmental atelectasis. Bibasilar scarring.  Trace bilateral pleural thickening.  Heart/Mediastinum: No supraclavicular adenopathy. Small  bilateral axillary nodes. Bovine arch. Normal heart size, without pericardial effusion. No hilar adenopathy. Mild retrocrural adenopathy. Example 8 mm on image 54. Borderline periesophageal adenopathy at 8 mm on image 27.  Upper Abdomen: Mild cirrhosis as evidenced by enlargement of the caudate and lateral segment left liver lobes. Prior splenectomy. Prominent right upper quadrant lymph nodes, likely reactive. Normal imaged portions of the gallbladder, pancreas, adrenal glands, kidneys.  Bones/Musculoskeletal: No acute osseous abnormality. Schmorl's node deformities involving the lower thoracic spine.  IMPRESSION: 1. No evidence of primary malignancy or metastatic disease within the chest. 2. Mild retrocrural adenopathy with borderline periesophageal adenopathy. Favored to be reactive. 3. Cirrhosis with likely secondary prominent upper abdominal nodes, incompletely imaged.   Electronically Signed   By: Abigail Miyamoto M.D.   On: 04/18/2014 16:20   Ct Biopsy  04/19/2014   CLINICAL DATA:  48 year old male with cirrhosis and abdominal/pelvic pain. CT evaluation demonstrates multiple peritoneal nodules of similar density. The largest nodule is located dependently within the anatomic pelvis. The patient has a history of prior splenectomy. CT biopsy is warranted to confirm tissue diagnosis.  EXAM: CT BIOPSY  Date: 04/19/2014  PROCEDURE: 1. CT-guided biopsy of pelvic peritoneal mass Interventional Radiologist:  Criselda Peaches, MD  ANESTHESIA/SEDATION: Moderate (conscious) sedation was used. Three mg Versed, 150 mcg Fentanyl were administered intravenously. The patient's vital signs were monitored continuously by radiology nursing throughout the procedure.  Sedation Time: 30 minutes  TECHNIQUE: Informed consent was obtained from the patient following explanation of the procedure, risks, benefits and alternatives. The patient understands, agrees and consents for the procedure. All questions were addressed. A time out was  performed.  A planning axial CT scan was performed. The soft tissue nodule in the deep anatomic pelvis was successfully identified. A suitable left trans gluteal approach was identified. A skin entry site was selected and marked. The region was then sterilely prepped and draped in the standard fashion using Betadine skin prep.  Local anesthesia was attained by infiltration with 1% lidocaine. Using intermittent CT fluoroscopic guidance, a 17 gauge trocar needle was carefully advanced to the margin of the nodule. Unfortunately, the nodule was firm and freely mobile and difficult to appears. Multiple 18 gauge core biopsies were attempted using the BioPince automated biopsy device. Despite good positioning of the needle, the biopsy fragments returned were very min secondary to attempt in 1.3 cm throws. Therefore, the under trocar was repositioned multiple times in an effort to connective the wider portion of the soft tissue mass. This proved extremely challenging given the mobile nature of the mass.  Core specimens were placed in saline and delivered to pathology for further evaluation. Post biopsy axial CT imaging demonstrates minimal hemorrhage in the perirectal fat. There are some locules of air the adjacent to the biopsy site.  COMPLICATIONS: None immediate.  IMPRESSION: 1. Challenging, but ultimately technically successful biopsy of deep pelvic peritoneal mass. The mass was freely mobile and very challenging to target. 2. Due to multiple repositioned is required for needle placement and the close proximity of the biopsy site to the rectum, consider prophylactic antibiotics versus close clinical monitoring as there is is at least a theoretical possibility of inadvertent rectal puncture. Findings and recommendations were discussed with Dr. Caryn Section by Dr. Laurence Ferrari at approximately noon on 04/19/2014.  Signed,  Criselda Peaches, MD  [facility]  [facility]    Electronically Signed   By: Jacqulynn Cadet M.D.   On: 04/19/2014 14:26    Assessment: 48 year old male admitted with abdominal pain, N/V, with CT showing metastatic disease of unknown etiology. Colonoscopy and EGD completed: four colon polyps, internal hemorrhoids, EGD with hiatal hernia, gastritis. PATH: H.pylori gastritis. Abdominal pain continues to be an issue since admission; however, he desires to advance his diet.   Suspected cirrhosis on CT: with +HCV RNA, unknown genotype. Question ETOH/NASH/Hep C as etiology. MELD 7. Well-compensated. Cirrhosis care as outpatient. Will check genotype now.   Plan: Follow-up on pending path from colonoscopy Follow-up on pending pelvic mass biopsy from 9/22 Advance to heart healthy diet Start treatment for H.pylori with generic Pylera, as he is allergic to penicillins Check Hep C genotype Recheck HFP today   Orvil Feil, ANP-BC [facility]    LOS: 5 days    04/20/2014, 9:08 AM    ADDENDUM: tetracycline unavailable. Sharyn Lull in pharmacy is ordering Pylera pack, which will arrive on 9/24. Patient will be able to take this home with him at discharge. Treatment to start tomorrow, 9/24.  Orvil Feil, ANP-BC [facility]

## 2014-04-20 NOTE — Evaluation (Signed)
Physical Therapy Evaluation Patient Details Name: Ashe Gago MRN: [id number] DOB: 1966-06-20 Today's Date: 04/20/2014   History of Present Illness  Archer Moist is a 48 y.o. male came to [facility] ed 04/15/2014 with  2-3 wks Abd pain. RUQ predominantly. Sharp. Comes and goes. Getting worse. Tylenol and advil w/o benefit. BMs (small and hard yesterday adn today after taking 2 laxatives). Constipated for past month, typically daily soft BM. Has gone 5-7 days w/o BM recently. Endorses hot and cold chills and general body aches. Denies SOB, fevers, dysuria, frequency, CP, lightheadedness, palpitations, nausea or vomiting. Tolerating po w/o pain. Pain is worse laying on r side. Improves w/ certain positions while laying.   Pt underwent biopsy on 04/19/14 at [facility].   Clinical Impression  Pt is a 48 year old male who presents to PT with dx of abdominal pain.  Pt currently does not report abdominal pain, only LBP secondary to being in bed this morning.  During evaluation, pt was (I) with bed mobility skills, transfers, and supervision for short distance gait.  Gait distance limited by hospital tornado drill and fatigue.  Pt did not use AD during gait, or show overt signs of balance issues.  Pt does report he uses his brothers rollator for community ambulation skills as his legs "give out" on occasion.  Pt did have some SOB with activity and is currently on 2 1/2 L O2; pt reports he does not use O2 at the home.  Pt is currently at baseline level of function and will be discharged from acute PT services.  Pt may benefit from HHPT services to address activity tolerance.  Pt requesting RW to use for gait at home, though during gait assessment no AD used or balance issues noted.      Follow Up Recommendations Home health PT (Possibility of HHPT to address activity tolerance, though pt may not need services if he continues be out of bed and ambulate in room)    Equipment Recommendations  Other (comment)  (Pt requesting RW as he uses his brothers as needed for community ambulation.  Pt did not uses AD during gait assessment, and did not have any overt balance issues requiring AD.)       Precautions / Restrictions Precautions Precautions: Fall Restrictions Weight Bearing Restrictions: No      Mobility  Bed Mobility Overal bed mobility: Independent                Transfers Overall transfer level: Independent                  Ambulation/Gait Ambulation/Gait assistance: Supervision Ambulation Distance (Feet): 20 Feet Assistive device: None Gait Pattern/deviations: Step-through pattern     General Gait Details: Gait distance limited by hospital tornado drill.  Pt does report he has been walking to the bathroom in the room, furniture walking "a little"     Balance Overall balance assessment: No apparent balance deficits (not formally assessed) (Pt reprots on occassion his knees "give out" and he stumbles, though no falls reports or LOB noted during evaluation.)                                           Pertinent Vitals/Pain Pain Assessment: 0-10 Pain Score: 5  Pain Location: LBP Pain Intervention(s): Limited activity within patient's tolerance;Premedicated before session    Home Living Family/patient expects to be  discharged to:: Private residence Living Arrangements: Spouse/significant other Available Help at Discharge: Available 24 hours/day (Girlfriend) Type of Home: Mobile home Home Access: Stairs to enter;Ramped entrance   Entrance Stairs-Number of Steps: 3 steps to back door, ramp to front door Home Layout: One level Home Equipment: None Additional Comments: Tub shower.      Prior Function Level of Independence: Independent         Comments: Pt reports he was (I) with bed mobility skills, transfers, and ambulation skills around the home without AD.  Pt does report occassional use of brothers rollator in the past couple weeks when  he has to amb longer distances (grocery shopping)        Extremity/Trunk Assessment               Lower Extremity Assessment: Overall WFL for tasks assessed         Communication   Communication: No difficulties  Cognition Arousal/Alertness: Awake/alert Behavior During Therapy: WFL for tasks assessed/performed Overall Cognitive Status: Within Functional Limits for tasks assessed                       Assessment/Plan    PT Assessment All further PT needs can be met in the next venue of care  PT Diagnosis     PT Problem List Decreased mobility;Decreased activity tolerance  PT Treatment Interventions     PT Goals (Current goals can be found in the Care Plan section) Acute Rehab PT Goals PT Goal Formulation: No goals set, d/c therapy     End of Session Equipment Utilized During Treatment: Gait belt;Oxygen (2 1/2 L O2) Activity Tolerance: Patient tolerated treatment well Patient left: in bed;with call bell/phone within reach           Time: 6546-5035 PT Time Calculation (min): 14 min   Charges:   PT Evaluation $Initial PT Evaluation Tier I: 1 Procedure     Shyasia Funches 04/20/2014, 10:42 AM

## 2014-04-20 NOTE — Progress Notes (Signed)
[facility] PROGRESS NOTE  Jeremy Mcgee ZOX:[id number] DOB: [birth year] DOA: 04/15/2014 PCP: No PCP Per Patient    Code Status: Full code Family Communication: Discussed with patient, family not available. Disposition Plan: Discharge when clinically appropriate.   Consultants:  IR  Gastroenterology, Dr.Fields  Oncology  Procedures:  04/19/14: Omental/peritoneal mass biopsy, pending  04/17/14: Colonoscopy per Dr. Doristine Mango left colon; 4 colon polyps; internal hemorrhoids.    04/16/14: EGD per Dr. Pennie Banter hernia; gastritis; small amount of retained food in the stomach; no source for metastatic disease identified; biopsy pending. Echo:- Left ventricle: The cavity size was normal. Wall thickness was increased in a pattern of mild LVH. Systolic function was vigorous. The estimated ejection fraction was in the range of 65% to 70%. Features are consistent with a pseudonormal left ventricular filling pattern, with concomitant abnormal relaxation and increased filling pressure (grade 2 diastolic dysfunction). - Left atrium: The atrium was moderately dilated. - Right ventricle: The cavity size was mildly dilated. - Right atrium: The atrium was mildly dilated. - Technically difficult study.    Antibiotics:  Levaquin on 9/19>>  HPI/Subjective: Still has some bilateral lower quadrant abdominal pain. Has not had a bowel movement in several days. No vomiting. Tolerating by mouth. Feels that breathing is improving.  Objective: Filed Vitals:   04/20/14 1440  BP: 119/78  Pulse: 74  Temp: 99.1 F (37.3 C)  Resp: 20    Intake/Output Summary (Last 24 hours) at 04/20/14 1937 Last data filed at 04/20/14 1814  Gross per 24 hour  Intake   1440 ml  Output   4700 ml  Net  -3260 ml   Filed Weights   04/15/14 1633 04/15/14 2158 04/18/14 0545  Weight: 127.007 kg (280 lb) 123.2 kg (271 lb 9.7 oz) 126.9 kg (279 lb 12.2 oz)    Exam:   General: Ill-appearing  obese [name] inno acute distress.  Cardiovascular: S1, S2, with a soft systolic murmur.  Respiratory: fine bilateral expiratory  wheezes and and occasional  crackles auscultated bilaterally. Breathing nonlabored.  Abdomen: Obese, positive bowel sounds, decreased  tender in the epigastrium and the hypogastrium; no appreciable distention or masses palpated.  Musculoskeletal: No acute hot joints. Pedal pulses palpable. Trace of pedal edema.  Neuropsychiatric: He is alert and oriented x3. Overall appears to be highly anxious.  Cranial nerves II through XII are grossly intact.   Data Reviewed: Basic Metabolic Panel:  Recent Labs Lab 04/15/14 1640 04/15/14 2133 04/16/14 0549 04/17/14 0545  NA 140  --  138 141  K 4.3  --  4.3 3.9  CL 105  --  103 101  CO2 22  --  24 27  GLUCOSE 122*  --  149* 135*  BUN 11  --  10 8  CREATININE 0.89 0.87 0.83 0.74  CALCIUM 9.0  --  8.6 8.4   Liver Function Tests:  Recent Labs Lab 04/15/14 1640 04/16/14 0549 04/17/14 0545 04/20/14 1105  AST 119* 106* 73* 128*  ALT 143* 144* 124* 196*  ALKPHOS 65 57 52 53  BILITOT 0.3 0.3 0.3 0.3  PROT 7.3 7.1 7.0 7.0  ALBUMIN 3.7 3.5 3.7 3.6    Recent Labs Lab 04/15/14 1640  LIPASE 72*   No results found for this basename: AMMONIA,  in the last 168 hours CBC:  Recent Labs Lab 04/15/14 1640 04/15/14 2133 04/16/14 0549 04/17/14 0545 04/18/14 0723 04/19/14 0559  WBC 13.3* 14.5* 7.7 19.5* 19.2* 19.5*  NEUTROABS 6.4  --   --   --   --   --  HGB 17.4* 16.6 16.7 15.4 15.5 15.6  HCT 50.7 49.0 50.5 47.5 46.6 48.0  MCV 96.4 95.9 98.2 98.3 97.9 98.4  PLT 221 227 225 234 228 231   Cardiac Enzymes:  Recent Labs Lab 04/15/14 1640  TROPONINI <0.30   BNP (last 3 results) No results found for this basename: PROBNP,  in the last 8760 hours CBG:  Recent Labs Lab 04/19/14 1621 04/19/14 2141 04/20/14 0736 04/20/14 1124 04/20/14 1638  GLUCAP 123* 149* 119* 132* 141*    No  results found for this or any previous visit (from the past 240 hour(s)).   Studies: Ct Biopsy  04/19/2014   CLINICAL DATA:  48 year old male with cirrhosis and abdominal/pelvic pain. CT evaluation demonstrates multiple peritoneal nodules of similar density. The largest nodule is located dependently within the anatomic pelvis. The patient has a history of prior splenectomy. CT biopsy is warranted to confirm tissue diagnosis.  EXAM: CT BIOPSY  Date: 04/19/2014  PROCEDURE: 1. CT-guided biopsy of pelvic peritoneal mass Interventional Radiologist:  Criselda Peaches, MD  ANESTHESIA/SEDATION: Moderate (conscious) sedation was used. Three mg Versed, 150 mcg Fentanyl were administered intravenously. The patient's vital signs were monitored continuously by radiology nursing throughout the procedure.  Sedation Time: 30 minutes  TECHNIQUE: Informed consent was obtained from the patient following explanation of the procedure, risks, benefits and alternatives. The patient understands, agrees and consents for the procedure. All questions were addressed. A time out was performed.  A planning axial CT scan was performed. The soft tissue nodule in the deep anatomic pelvis was successfully identified. A suitable left trans gluteal approach was identified. A skin entry site was selected and marked. The region was then sterilely prepped and draped in the standard fashion using Betadine skin prep.  Local anesthesia was attained by infiltration with 1% lidocaine. Using intermittent CT fluoroscopic guidance, a 17 gauge trocar needle was carefully advanced to the margin of the nodule. Unfortunately, the nodule was firm and freely mobile and difficult to appears. Multiple 18 gauge core biopsies were attempted using the BioPince automated biopsy device. Despite good positioning of the needle, the biopsy fragments returned were very min secondary to attempt in 1.3 cm throws. Therefore, the under trocar was repositioned multiple times in  an effort to connective the wider portion of the soft tissue mass. This proved extremely challenging given the mobile nature of the mass.  Core specimens were placed in saline and delivered to pathology for further evaluation. Post biopsy axial CT imaging demonstrates minimal hemorrhage in the perirectal fat. There are some locules of air the adjacent to the biopsy site.  COMPLICATIONS: None immediate.  IMPRESSION: 1. Challenging, but ultimately technically successful biopsy of deep pelvic peritoneal mass. The mass was freely mobile and very challenging to target. 2. Due to multiple repositioned is required for needle placement and the close proximity of the biopsy site to the rectum, consider prophylactic antibiotics versus close clinical monitoring as there is is at least a theoretical possibility of inadvertent rectal puncture. Findings and recommendations were discussed with Dr. Caryn Section by Dr. Laurence Ferrari at approximately noon on 04/19/2014.  Signed,  Criselda Peaches, MD  [facility]  [facility]   Electronically Signed   By: Jacqulynn Cadet M.D.   On: 04/19/2014 14:26    Scheduled Meds: . [START ON 04/21/2014] bismuth-metronidazole-tetracycline  3 capsule Oral TID AC & HS  . heparin  5,000 Units Subcutaneous 3 times per day  . insulin aspart  0-15 Units  Subcutaneous TID WC  . insulin aspart  0-5 Units Subcutaneous QHS  . ipratropium-albuterol  3 mL Nebulization QID  . levofloxacin  500 mg Oral Daily  . lisinopril  5 mg Oral Daily  . methylPREDNISolone (SOLU-MEDROL) injection  60 mg Intravenous Q12H  . milk and molasses  1 enema Rectal Once  . morphine  60 mg Oral Q12H  . nicotine  14 mg Transdermal Daily  . pantoprazole  40 mg Oral BID AC  . polyethylene glycol  17 g Oral BID  . senna  1 tablet Oral BID   Continuous Infusions:   Assessment and plan:  Principal Problem:   Abdominal pain, epigastric Active Problems:   Peritoneal  carcinomatosis   Elevated LFTs   History of ETOH abuse   Obesity, unspecified   COPD exacerbation   CAD (coronary artery disease)   Constipation   History of splenectomy   Family history of colon cancer   Tobacco abuse   Cholelithiasis   Colon polyps   Gastritis   Hiatal hernia   Alcoholic cirrhosis of liver without ascites   Hepatitis C antibody test positive   Brief history:  The patient is a 48 year old man who is relocating here from [place], with a history of coronary artery disease, COPD/asthma, and hypertension, who presented to the emergency department on 04/15/2014 with a chief complaint of persistent abdominal pain and secondarily shortness of breath and chest congestion. CT scan of the abdomen and pelvis revealed peritoneal/omental disease concerning for malignancy. It also revealed a possible GE/gastric mass. Gastroenterology was consulted. Workup revealed no gastric mass, but a hiatal hernia and gastritis-biopsies taken and are pending. the colonoscopy revealed colon polyps-not biopsied. Oncology consulted and ordered IR to biopsy one of the omental masses. This is being done today on 9/22. In the meantime, the patient has had severe abdominal pain which is now being treated mostly with oral opiates. For workup of his elevated LFTs, he was noted to have cirrhosis on the CT which was thought to be secondary to alcohol, but now could be secondary to chronic hepatitis C as his HCV antibody was positive. Quantitative HCV is pending. He is being treated for COPD exacerbation which is improving. The patient will likely be hospitalized for the next 24-48 hours awaiting the omental biopsy results for definitive disposition and followup per oncology.    1. Abdominal pain, likely secondary to presumed peritoneal carcinomatosis +/-gastritis seen on the EGD.  CT of his abdomen and pelvis was suggestive of a possible gastric or GE mass, however, there was no gastric mass seen per Dr.  Oneida Alar, but rather a hiatal hernia and mild gastritis on the EGD on 9/19. Await biopsies results. Dilaudid was was discontinued by oncology and MS Contin was started. Continue Protonix. His diet was advanced which he is tolerating well.  Presumed peritoneal carcinomatosis/metastatic disease of unknown etiology per CT of the abdomen and pelvis. Per colonoscopy by Dr. Oneida Alar on 9/20, there was no evidence of colon cancer, but the patient had redundant colon and 4 polyps. Colonoscopy performed because of family history of colon cancer in his brother and worsening constipation. Will continue laxative therapy. CEA ordered on admission was within normal limits at 0.9 and CEA 19-9 was elevated at 12.4. Oncology was consulted and ordered CEA 125. They discussed the patient's case with oncology surgeon at [facility], but it was recommended that percutaneous biopsy be done instead. The patient has gotten the biopsy by IR at [facility]. CT of the chest  for further staging revealed no evidence of metastatic disease. Their assessment noted and appreciated. Followup biopsy results Elevated LFTs/Cirrhosis per imaging studies/Hepatitis C antibody positive. The patient has a history of alcohol abuse. He reports stopping in January of 2015, but admitted having 1 beer last week. Essentially, he no longer drinks. He also has gallstones noted on the CT. viral hepatitis panel ordered and revealed a positive hepatitis C antibody. We'll order HCV quantitative. The elevated liver transaminases could be secondary to hepatitis C, cholelithiasis, and alcohol abuse. He does not appear to have sequelae of end-stage cirrhosis. Constipation. Status post GoLYTELY for colonoscopy. We'll continue MiraLax and Senokot S. twice a day. He continues to complain of constipation despite multiple laxatives. We'll add a milk of molasses enema Reported history of CAD and hypertension. The patient denies having a heart attack, but was told he had significant  coronary artery disease. He has not had evaluation with cardiac catheterization. He denies any history of congestive heart failure. He had been treated with metoprolol and lisinopril in the past for hypertension, but was unable to afford medications. HCTZ discontinued following admission. Lisinopril was started and will be titrated up to 5 mg daily. We'll hold off on Toprol because of the bronchospasms. Will hold aspirin for now. His initial troponin I was negative. 2-D echocardiogram shows normal ejection fraction.Marland Kitchen COPD with exacerbation. The patient has a long history of smoking one pack of cigarettes per day, but he stopped a few days before hospital admission. Continue nicotine replacement therapy. Since his respiratory status appears to be improving, will change Solu-Medrol to prednisone. He was given 1 dose of Mucomyst nebulizer. Continue Levaquin and bronchodilators Hyperglycemia.  Likely steroid induced. His hemoglobin A1c was 5.8. We'll Continue sliding scale NovoLog on steroid therapy. Obesity. Carbohydrate modified/heart healthy diet ordered. TSH is slightly low. Recommend followup TSH in 3-6 months.  Leukocytosis. This is likely secondary to steroid therapy. We'll continue to follow. Mildly low TSH. No treatment or further workup needed at this point. Would recommend followup TSH in 3-6 months. Chronic deconditioning. The patient appears to be chronically deconditioned 48 year old. Seen by physical therapy and recommended home health.     Time spent: 25 minutes.    [facility] Pager [phone]. If 7PM-7AM, please contact night-coverage at [email or web address], password [facility] 04/20/2014, 7:37 PM  LOS: 5 days

## 2014-04-21 ENCOUNTER — Inpatient Hospital Stay (HOSPITAL_COMMUNITY): Payer: Self-pay

## 2014-04-21 DIAGNOSIS — K7460 Unspecified cirrhosis of liver: Secondary | ICD-10-CM

## 2014-04-21 DIAGNOSIS — I2510 Atherosclerotic heart disease of native coronary artery without angina pectoris: Secondary | ICD-10-CM

## 2014-04-21 LAB — GLUCOSE, CAPILLARY
GLUCOSE-CAPILLARY: 117 mg/dL — AB (ref 70–99)
Glucose-Capillary: 87 mg/dL (ref 70–99)
Glucose-Capillary: 103 mg/dL — ABNORMAL HIGH (ref 70–99)
Glucose-Capillary: 123 mg/dL — ABNORMAL HIGH (ref 70–99)

## 2014-04-21 LAB — CBC
HCT: 49.9 % (ref 39.0–52.0)
HEMOGLOBIN: 17 g/dL (ref 13.0–17.0)
MCH: 32.9 pg (ref 26.0–34.0)
MCHC: 34.1 g/dL (ref 30.0–36.0)
MCV: 96.5 fL (ref 78.0–100.0)
Platelets: 238 10*3/uL (ref 150–400)
RBC: 5.17 MIL/uL (ref 4.22–5.81)
RDW: 14.4 % (ref 11.5–15.5)
WBC: 20.5 10*3/uL — ABNORMAL HIGH (ref 4.0–10.5)

## 2014-04-21 LAB — BASIC METABOLIC PANEL
Anion gap: 10 (ref 5–15)
BUN: 17 mg/dL (ref 6–23)
CO2: 31 meq/L (ref 19–32)
Calcium: 8.9 mg/dL (ref 8.4–10.5)
Chloride: 98 mEq/L (ref 96–112)
Creatinine, Ser: 0.78 mg/dL (ref 0.50–1.35)
GFR calc Af Amer: >90 mL/min (ref >90)
GFR calc non Af Amer: >90 mL/min (ref >90)
GLUCOSE: 112 mg/dL — AB (ref 70–99)
POTASSIUM: 4.1 meq/L (ref 3.7–5.3)
Sodium: 139 mEq/L (ref 137–147)

## 2014-04-21 MED ORDER — MORPHINE SULFATE ER 30 MG PO TBCR
30.0000 mg | EXTENDED_RELEASE_TABLET | Freq: Two times a day (BID) | ORAL | Status: DC
  Administered 2014-04-21 – 2014-04-22 (×2): 30 mg via ORAL
  Filled 2014-04-21 (×2): qty 1

## 2014-04-21 MED ORDER — BISACODYL 10 MG RE SUPP
10.0000 mg | Freq: Once | RECTAL | Status: AC
  Administered 2014-04-21: 10 mg via RECTAL
  Filled 2014-04-21: qty 1

## 2014-04-21 MED ORDER — OXYCODONE HCL 5 MG PO TABS
5.0000 mg | ORAL_TABLET | Freq: Three times a day (TID) | ORAL | Status: DC | PRN
  Administered 2014-04-21 – 2014-04-22 (×3): 5 mg via ORAL
  Filled 2014-04-21 (×3): qty 1

## 2014-04-21 MED ORDER — MAGNESIUM CITRATE PO SOLN
1.0000 | Freq: Once | ORAL | Status: AC
Start: 2014-04-21 — End: 2014-04-21
  Administered 2014-04-21: 1 via ORAL
  Filled 2014-04-21: qty 296

## 2014-04-21 MED FILL — Hydromorphone HCl Inj 1 MG/ML: INTRAMUSCULAR | Qty: 1 | Status: AC

## 2014-04-21 NOTE — Progress Notes (Signed)
Subjective: Still complaining of lower quadrant abdominal pain. No worse after biopsy performed yesterday. No fever, night sweats, with improved breathing and wheezing. Denies any nausea, vomiting, but with cough without expectoration, hemoptysis, epistaxis, melena, hematochezia, hematuria, or incontinence.    REVIEW OF SYSTEM: Other than that discussed above is noncontributory. Objective: Vital signs in last 24 hours: Temp:  [97.9 F (36.6 C)-99.1 F (37.3 C)] 98.1 F (36.7 C) (09/24 0506) Pulse Rate:  [74-107] 90 (09/24 0506) Resp:  [20] 20 (09/24 0506) BP: (119-138)/(74-100) 129/100 mmHg (09/24 0506) SpO2:  [93 %-97 %] 94 % (09/24 0715)  PHYSICAL EXAMINATION: Morbidly obese. HEAD:  Normocephalic. Pupils equal and reactive to light and accomodation.  No icterus OROPHARYNX: No exudate, plaques, or vesicles. Tongue normal size and papillation. NECK: Supple.  Thyroid non tender without nodules. No adenopathy. No jugular venous distensionat 30 degrees. CHEST: Increased AP diameter LUNGS: Scattered rhonchi bilaterally. No dullness to percussion. HEART: S-1 and S-2 normal.  No murmur, gallop, click, or rub. ABDOMEN: Normoactive bowel sounds. Soft. No organomegaly.No free fluid wave or shifting dullness. No CVA tenderness. No free fluid or shifting dullness. Tenderness in the left lower quadrant with no CVA tenderness. EXTREMITIES: No edema, cyanosis, or clubbing.  NEUROLOGIC: No focal sensory or motor deficits. GENITALIA: Not examined..   Lab Results:   Recent Labs  04/19/14 0559 04/21/14 0520  WBC 19.5* 20.5*  HGB 15.6 17.0  HCT 48.0 49.9  PLT 231 238   BMET  Recent Labs  04/21/14 0520  NA 139  K 4.1  CL 98  CO2 31  GLUCOSE 112*  BUN 17  CREATININE 0.78  CALCIUM 8.9    Studies/Results: Ct Biopsy  04/19/2014   CLINICAL DATA:  48 year old male with cirrhosis and abdominal/pelvic pain. CT evaluation demonstrates multiple peritoneal nodules of similar density. The  largest nodule is located dependently within the anatomic pelvis. The patient has a history of prior splenectomy. CT biopsy is warranted to confirm tissue diagnosis.  EXAM: CT BIOPSY  Date: 04/19/2014  PROCEDURE: 1. CT-guided biopsy of pelvic peritoneal mass Interventional Radiologist:  Jeremy Peaches, MD  ANESTHESIA/SEDATION: Moderate (conscious) sedation was used. Three mg Versed, 150 mcg Fentanyl were administered intravenously. The patient's vital signs were monitored continuously by radiology nursing throughout the procedure.  Sedation Time: 30 minutes  TECHNIQUE: Informed consent was obtained from the patient following explanation of the procedure, risks, benefits and alternatives. The patient understands, agrees and consents for the procedure. All questions were addressed. A time out was performed.  A planning axial CT scan was performed. The soft tissue nodule in the deep anatomic pelvis was successfully identified. A suitable left trans gluteal approach was identified. A skin entry site was selected and marked. The region was then sterilely prepped and draped in the standard fashion using Betadine skin prep.  Local anesthesia was attained by infiltration with 1% lidocaine. Using intermittent CT fluoroscopic guidance, a 17 gauge trocar needle was carefully advanced to the margin of the nodule. Unfortunately, the nodule was firm and freely mobile and difficult to appears. Multiple 18 gauge core biopsies were attempted using the BioPince automated biopsy device. Despite good positioning of the needle, the biopsy fragments returned were very min secondary to attempt in 1.3 cm throws. Therefore, the under trocar was repositioned multiple times in an effort to connective the wider portion of the soft tissue mass. This proved extremely challenging given the mobile nature of the mass.  Core specimens were placed in saline and delivered to  pathology for further evaluation. Post biopsy axial CT imaging  demonstrates minimal hemorrhage in the perirectal fat. There are some locules of air the adjacent to the biopsy site.  COMPLICATIONS: None immediate.  IMPRESSION: 1. Challenging, but ultimately technically successful biopsy of deep pelvic peritoneal mass. The mass was freely mobile and very challenging to target. 2. Due to multiple repositioned is required for needle placement and the close proximity of the biopsy site to the rectum, consider prophylactic antibiotics versus close clinical monitoring as there is is at least a theoretical possibility of inadvertent rectal puncture. Findings and recommendations were discussed with Dr. Caryn Mcgee by Dr. Laurence Mcgee at approximately noon on 04/19/2014.  Signed,  Jeremy Peaches, MD  [facility]  [facility]   Electronically Signed   By: Jeremy Mcgee M.D.   On: 04/19/2014 14:26    Pathology:  for Jeremy Mcgee, Jeremy Mcgee ([id number]) [name], Jeremy Mcgee Collected: 04/19/2014 Client: [place] Accession: [id number] Received: 04/19/2014 Jeremy Cadet, MD DOB: 10-May-1966 Age: 13 Gender: M Reported: 04/20/2014 [address] Patient Ph: [phone] MRN #: [id number] [name], [name] Visit #: [id number].[place] Chart #: Phone:  Fax: CC: Jeremy Pane, PA-C REPORT OF SURGICAL PATHOLOGY FINAL DIAGNOSIS Diagnosis Soft Tissue Needle Core Biopsy, Pelvic Peritoneal mass - BENIGN SPLENIC TISSUE CONSISTENT WITH SPLENOSIS/ACCESSORY SPLEEN. Jeremy Greenhouse MD Pathologist, Electronic Signature (Case signed 04/20/2014) Specimen Gross and Clinical Information Specimen(s) Obtained: Soft Tissue Needle Core Biopsy, Pelvic Peritoneal mass Specimen Clinical Information Remote hx Splenectomy now with mutiple peritoneal nodules. Splenosis vs Peritoneal Carcinomatosis from unknown primary ([facility]) Gross Received in saline are several cores and irregular pieces of gray white to dark red soft tissue, 0.8 x  0.4 x 0.1 cm in aggregate, submitted in one block. (SW:ds 04/19/14) Report signed out from the following location(s) Technical Component performed at [facility], [place], [place] [address]. CLIA #: [id number], Interpretation performed at [facility], [place], [place] [address]. CLIA #: [id number],  Assessment: #1. Abdominal splenosis, symptomatic, no evidence of malignancy. #2. Cirrhosis of the liver but radiologic criteria. #3. Status post splenectomy in the 1970s. #4. COPD with exacerbation, improved. #5. Morbid obesity. #6. Coronary artery disease. #7. Remote history of ethanol abuse.    Plan: #1. Suggest general surgery consult for evaluation of resection since it appears that the patient's splenosis is symptomatic. #2. Will sign off at this time. Please feel free to call should any additional problems arise that require hematologic and oncologic input.  I appreciate the opportunity of sharing in his care.   Principal Problem:   Abdominal pain, epigastric Active Problems:   Peritoneal carcinomatosis   Elevated LFTs   History of ETOH abuse   Obesity, unspecified   COPD exacerbation   CAD (coronary artery disease)   Constipation   History of splenectomy   Family history of colon cancer   Tobacco abuse   Cholelithiasis   Colon polyps   Gastritis   Hiatal hernia   Alcoholic cirrhosis of liver without ascites   Hepatitis C antibody test positive    LOS: 6 days   Doroteo Bradford, MD  @TODAY @ 7:27 AM  Voice recognition software has been used in the generation of this note.  Inadvertent like-sounding words may be incorporated and not picked up on correction or review.

## 2014-04-21 NOTE — Progress Notes (Signed)
[facility] PROGRESS NOTE  Jeremy Mcgee GEX:[id number] DOB: 1966/01/05 DOA: 04/15/2014 PCP: No PCP Per Patient    Code Status: Full code Family Communication: Discussed with patient, family not available. Disposition Plan: Discharge when clinically appropriate.   Consultants:  IR  Gastroenterology, Dr.Fields  Oncology  Procedures:  04/19/14: Omental/peritoneal mass biopsy, pending  04/17/14: Colonoscopy per Dr. Doristine Mango left colon; 4 colon polyps; internal hemorrhoids.    04/16/14: EGD per Dr. Pennie Banter hernia; gastritis; small amount of retained food in the stomach; no source for metastatic disease identified; biopsy pending. Echo:- Left ventricle: The cavity size was normal. Wall thickness was increased in a pattern of mild LVH. Systolic function was vigorous. The estimated ejection fraction was in the range of 65% to 70%. Features are consistent with a pseudonormal left ventricular filling pattern, with concomitant abnormal relaxation and increased filling pressure (grade 2 diastolic dysfunction). - Left atrium: The atrium was moderately dilated. - Right ventricle: The cavity size was mildly dilated. - Right atrium: The atrium was mildly dilated. - Technically difficult study.    Antibiotics:  Levaquin on 9/19>>  HPI/Subjective: Patient is somewhat sleepy today.  Reports pain is relatively controlled. Breathing improving.  Objective: Filed Vitals:   04/21/14 1300  BP: 140/98  Pulse: 82  Temp: 98.7 F (37.1 C)  Resp: 20    Intake/Output Summary (Last 24 hours) at 04/21/14 1952 Last data filed at 04/21/14 1846  Gross per 24 hour  Intake    560 ml  Output   1400 ml  Net   -840 ml   Filed Weights   04/15/14 1633 04/15/14 2158 04/18/14 0545  Weight: 127.007 kg (280 lb) 123.2 kg (271 lb 9.7 oz) 126.9 kg (279 lb 12.2 oz)    Exam:   General: Ill-appearing obese [name] inno acute distress.  Cardiovascular: S1, S2,  with a soft systolic murmur.  Respiratory: clear bilaterally.  Abdomen: Obese, positive bowel sounds, decreased  tender in the epigastrium and the hypogastrium; no appreciable distention or masses palpated.  Musculoskeletal: No acute hot joints. Pedal pulses palpable. Trace of pedal edema.  Neuropsychiatric: He is alert and oriented x3. Overall appears to be highly anxious.  Cranial nerves II through XII are grossly intact.   Data Reviewed: Basic Metabolic Panel:  Recent Labs Lab 04/15/14 1640 04/15/14 2133 04/16/14 0549 04/17/14 0545 04/21/14 0520  NA 140  --  138 141 139  K 4.3  --  4.3 3.9 4.1  CL 105  --  103 101 98  CO2 22  --  24 27 31   GLUCOSE 122*  --  149* 135* 112*  BUN 11  --  10 8 17   CREATININE 0.89 0.87 0.83 0.74 0.78  CALCIUM 9.0  --  8.6 8.4 8.9   Liver Function Tests:  Recent Labs Lab 04/15/14 1640 04/16/14 0549 04/17/14 0545 04/20/14 1105  AST 119* 106* 73* 128*  ALT 143* 144* 124* 196*  ALKPHOS 65 57 52 53  BILITOT 0.3 0.3 0.3 0.3  PROT 7.3 7.1 7.0 7.0  ALBUMIN 3.7 3.5 3.7 3.6    Recent Labs Lab 04/15/14 1640  LIPASE 72*   No results found for this basename: AMMONIA,  in the last 168 hours CBC:  Recent Labs Lab 04/15/14 1640  04/16/14 0549 04/17/14 0545 04/18/14 0723 04/19/14 0559 04/21/14 0520  WBC 13.3*  < > 7.7 19.5* 19.2* 19.5* 20.5*  NEUTROABS 6.4  --   --   --   --   --   --  HGB 17.4*  < > 16.7 15.4 15.5 15.6 17.0  HCT 50.7  < > 50.5 47.5 46.6 48.0 49.9  MCV 96.4  < > 98.2 98.3 97.9 98.4 96.5  PLT 221  < > 225 234 228 231 238  < > = values in this interval not displayed. Cardiac Enzymes:  Recent Labs Lab 04/15/14 1640  TROPONINI <0.30   BNP (last 3 results) No results found for this basename: PROBNP,  in the last 8760 hours CBG:  Recent Labs Lab 04/20/14 1638 04/20/14 2051 04/21/14 0750 04/21/14 1124 04/21/14 1657  GLUCAP 141* 183* 87 103* 123*    No results found for this or any previous visit (from  the past 240 hour(s)).   Studies: No results found.  Scheduled Meds: . bismuth-metronidazole-tetracycline  3 capsule Oral TID AC & HS  . heparin  5,000 Units Subcutaneous 3 times per day  . insulin aspart  0-15 Units Subcutaneous TID WC  . insulin aspart  0-5 Units Subcutaneous QHS  . ipratropium-albuterol  3 mL Nebulization QID  . levofloxacin  500 mg Oral Daily  . lisinopril  5 mg Oral Daily  . morphine  30 mg Oral Q12H  . nicotine  14 mg Transdermal Daily  . pantoprazole  40 mg Oral BID AC  . polyethylene glycol  17 g Oral BID  . predniSONE  40 mg Oral Q breakfast  . senna  1 tablet Oral BID   Continuous Infusions:   Assessment and plan:  Principal Problem:   Abdominal pain, epigastric Active Problems:   Peritoneal carcinomatosis   Elevated LFTs   History of ETOH abuse   Obesity, unspecified   COPD exacerbation   CAD (coronary artery disease)   Constipation   History of splenectomy   Family history of colon cancer   Tobacco abuse   Cholelithiasis   Colon polyps   Gastritis   Hiatal hernia   Alcoholic cirrhosis of liver without ascites   Hepatitis C antibody test positive   Brief history:  The patient is a 48 year old man who is relocating here from [place], with a history of coronary artery disease, COPD/asthma, and hypertension, who presented to the emergency department on 04/15/2014 with a chief complaint of persistent abdominal pain and secondarily shortness of breath and chest congestion. CT scan of the abdomen and pelvis revealed peritoneal/omental disease concerning for malignancy. It also revealed a possible GE/gastric mass. Gastroenterology was consulted. Workup revealed no gastric mass, but a hiatal hernia and gastritis-biopsies taken and are pending. the colonoscopy revealed colon polyps-not biopsied. Oncology consulted and ordered IR to biopsy one of the omental masses. This is being done today on 9/22. In the meantime, the patient has had severe  abdominal pain which is now being treated mostly with oral opiates. For workup of his elevated LFTs, he was noted to have cirrhosis on the CT which was thought to be secondary to alcohol, but now could be secondary to chronic hepatitis C as his HCV antibody was positive. Quantitative HCV is pending. He is being treated for COPD exacerbation which is improving. The patient will likely be hospitalized for the next 24-48 hours awaiting the omental biopsy results for definitive disposition and followup per oncology.    1. Abdominal pain, likely secondary to presumed peritoneal carcinomatosis +/-gastritis seen on the EGD.  CT of his abdomen and pelvis was suggestive of a possible gastric or GE mass, however, there was no gastric mass seen per Dr. Oneida Alar, but rather a hiatal hernia  and mild gastritis on the EGD on 9/19. Await biopsies results. Dilaudid was was discontinued by oncology and MS Contin was started. Continue Protonix. His diet was advanced which he is tolerating well. Patient is somewhat lethargic today, so we will decrease pain medications. Presumed peritoneal carcinomatosis/metastatic disease of unknown etiology per CT of the abdomen and pelvis. Per colonoscopy by Dr. Oneida Alar on 9/20, there was no evidence of colon cancer, but the patient had redundant colon and 4 polyps. Colonoscopy performed because of family history of colon cancer in his brother and worsening constipation. Will continue laxative therapy. CEA ordered on admission was within normal limits at 0.9 and CEA 19-9 was elevated at 12.4. Oncology was consulted and ordered CEA 125. They discussed the patient's case with oncology surgeon at [facility], but it was recommended that percutaneous biopsy be done instead. The patient has gotten the biopsy by IR at [facility]. CT of the chest for further staging revealed no evidence of metastatic disease. Their assessment noted and appreciated. Biopsy results indicated splenic tissue. Discussed with Dr. Arnoldo Morale  and it was not felt that surgical intervention was required in this case. Elevated LFTs/Cirrhosis per imaging studies/Hepatitis C  The patient has a history of alcohol abuse. He reports stopping in January of 2015, but admitted having 1 beer last week. Essentially, he no longer drinks. He also has gallstones noted on the CT. viral hepatitis panel ordered and revealed a positive hepatitis C antibody with elevated HCV RNA.  Genotype is pending. The elevated liver transaminases could be secondary to hepatitis C, cholelithiasis, and alcohol abuse. He does not appear to have sequelae of end-stage cirrhosis. Constipation. Status post GoLYTELY for colonoscopy. We'll continue MiraLax and Senokot S. twice a day. He continues to complain of constipation despite multiple laxatives. We'll add a milk of molasses enema Reported history of CAD and hypertension. The patient denies having a heart attack, but was told he had significant coronary artery disease. He has not had evaluation with cardiac catheterization. He denies any history of congestive heart failure. He had been treated with metoprolol and lisinopril in the past for hypertension, but was unable to afford medications. HCTZ discontinued following admission. Lisinopril was started and will be titrated up to 5 mg daily. We'll hold off on Toprol because of the bronchospasms. Will hold aspirin for now. His initial troponin I was negative. 2-D echocardiogram shows normal ejection fraction. COPD with exacerbation. The patient has a long history of smoking one pack of cigarettes per day, but he stopped a few days before hospital admission. Continue nicotine replacement therapy. Since his respiratory status appears to be improving, will change Solu-Medrol to prednisone. He was given 1 dose of Mucomyst nebulizer. Continue Levaquin and bronchodilators Hyperglycemia.  Likely steroid induced. His hemoglobin A1c was 5.8. We'll Continue sliding scale NovoLog on steroid  therapy. Obesity. Carbohydrate modified/heart healthy diet ordered. TSH is slightly low. Recommend followup TSH in 3-6 months.  Leukocytosis. This is likely secondary to steroid therapy. We'll continue to follow. Mildly low TSH. No treatment or further workup needed at this point. Would recommend followup TSH in 3-6 months. Chronic deconditioning. The patient appears to be chronically deconditioned 48 year old. Seen by physical therapy and recommended home health.     Time spent: 25 minutes.    [facility] Pager [phone]. If 7PM-7AM, please contact night-coverage at [email or web address], password [facility] 04/21/2014, 7:52 PM  LOS: 6 days

## 2014-04-22 LAB — GLUCOSE, CAPILLARY
Glucose-Capillary: 90 mg/dL (ref 70–99)
Glucose-Capillary: 105 mg/dL — ABNORMAL HIGH (ref 70–99)

## 2014-04-22 MED ORDER — OXYCODONE HCL 5 MG PO TABS: 5.0000 mg | ORAL_TABLET | Freq: Four times a day (QID) | ORAL | Status: DC | PRN

## 2014-04-22 MED ORDER — ALBUTEROL SULFATE (2.5 MG/3ML) 0.083% IN NEBU: 2.5000 mg | INHALATION_SOLUTION | Freq: Four times a day (QID) | RESPIRATORY_TRACT | Status: DC | PRN

## 2014-04-22 MED ORDER — LISINOPRIL 5 MG PO TABS: 5.0000 mg | ORAL_TABLET | Freq: Every day | ORAL | Status: DC

## 2014-04-22 MED ORDER — PREDNISONE 10 MG PO TABS: ORAL_TABLET | ORAL | Status: DC

## 2014-04-22 MED ORDER — GUAIFENESIN ER 600 MG PO TB12: 600.0000 mg | ORAL_TABLET | Freq: Two times a day (BID) | ORAL | Status: DC

## 2014-04-22 MED ORDER — POLYETHYLENE GLYCOL 3350 17 G PO PACK: 17.0000 g | PACK | Freq: Every day | ORAL | Status: DC

## 2014-04-22 MED ORDER — BIS SUBCIT-METRONID-TETRACYC 140-125-125 MG PO CAPS: 3.0000 | ORAL_CAPSULE | Freq: Three times a day (TID) | ORAL | Status: DC

## 2014-04-22 MED ORDER — PANTOPRAZOLE SODIUM 40 MG PO TBEC: 40.0000 mg | DELAYED_RELEASE_TABLET | Freq: Two times a day (BID) | ORAL | Status: DC

## 2014-04-22 MED ORDER — SIMETHICONE 40 MG/0.6ML PO SUSP
ORAL | Status: AC
  Filled 2014-04-22: qty 1.8

## 2014-04-22 MED ORDER — MORPHINE SULFATE ER 30 MG PO TBCR: 30.0000 mg | EXTENDED_RELEASE_TABLET | Freq: Two times a day (BID) | ORAL | Status: DC

## 2014-04-22 NOTE — Progress Notes (Deleted)
Physician Discharge Summary  Jeremy Mcgee NID:[id number] DOB: 02/19/1966 DOA: 04/15/2014  PCP: No PCP Per Patient  Admit date: 04/15/2014 Discharge date: 04/22/2014  Time spent: 40 minutes  Recommendations for Outpatient Follow-up:  1. Patient was set up with home health services. 2. 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 He had a CT scan of the abdomen and pelvis done which revealed peritoneal/omental disease concerning for malignancy. It also revealed a possible GE/gastric mass. Gastroenterology was consulted. Workup revealed no gastric mass, but a hiatal hernia and gastric biopsies taken. Colonoscopy revealed colon polyps which were not biopsied. Oncology was consulted and ordered interventional radiology to biopsy one of the omental masses. This was done on 9/22 with results indicating splenic tissue. It was likely that he had accessory spleen is omentum. Further workup was not felt necessary by oncology. Case was  discussed with general surgery, Dr. Arnoldo Morale to see if any surgical management would be required for his pain relief, but it is felt more appropriate to treat the patient with pain medications. It was not felt that surgical management would be needed at this time. Since that time, patient did have significant abdominal pain which was managed by oral opiates. Further workup indicated elevated liver function tests while imaging confirmed evidence of cirrhosis. He was noted to have a positive hepatitis C antibody with elevated hepatitis C RNA. He will followup with gastroenterology for further management. Gastric biopsies were also positive for H. pylori and he is given appropriate treatment. Regarding his COPD, he was started on intravenous steroids and was transitioned to prednisone. He was ambulating in the halls and did not qualify for supplemental oxygen. Patient is ready for discharge home.   Procedures: 04/19/14: Omental/peritoneal mass biopsy, pending  04/17/14: Colonoscopy per Dr. Doristine Mango left colon; 4 colon polyps; internal hemorrhoids.  04/16/14: EGD per Dr. Pennie Banter hernia; gastritis; small amount of retained food in the stomach; no source for metastatic disease identified; biopsy pending. Echo:- Left ventricle: The cavity size was normal. Wall thickness was increased in a pattern of mild LVH. Systolic function was vigorous. The estimated ejection fraction was in the range of 65% to 70%. Features are consistent with a pseudonormal left ventricular filling pattern, with concomitant abnormal relaxation and increased filling pressure (grade 2 diastolic dysfunction). - Left atrium: The atrium was moderately dilated. - Right ventricle: The cavity size was mildly dilated. - Right atrium: The atrium was mildly dilated. - Technically difficult study.   Consultations:  Oncology  Gastroenterology  Interventional radiology  Discharge Exam: Filed Vitals:   04/22/14 0547  BP:  136/71  Pulse: 84  Temp: 97.9 F (36.6 C)  Resp: 20    General: NAD Cardiovascular: S1, S2 RRR Respiratory: CTA B  Discharge Instructions You were cared for by a hospitalist during your hospital stay. If you have any questions about your discharge medications or the care you received while you were in the hospital after you are discharged, you can call the unit and asked to speak with the hospitalist on call if the hospitalist that took care of you is not available. Once you are discharged, your primary care physician will handle any further medical issues. Please note that NO REFILLS for any discharge medications will be authorized once you are discharged, as it is imperative that you return to your primary care physician (or establish a relationship with a primary care physician if you do not have one) for your aftercare needs so that they can reassess your need for medications and monitor your lab values.  Discharge Instructions   Call MD for:  difficulty breathing, headache or visual disturbances    Complete by:  As directed      Call MD for:  severe uncontrolled pain    Complete by:  As directed      Call MD for:  temperature >100.4    Complete by:  As directed      DME Nebulizer machine    Complete by:  As directed      Diet - low sodium heart healthy    Complete by:  As directed      Face-to-face encounter (required for Medicare/Medicaid patients)    Complete by:  As directed   I MEMON,JEHANZEB certify that this patient is under my care and that I, or a nurse practitioner or physician's assistant working with me, had a face-to-face encounter that meets the physician face-to-face encounter requirements with this patient on 04/22/2014. The encounter with the patient was in whole, or in part for the following medical condition(s) which is the primary reason for home health care (List medical condition): copd exacerbation, needs home health RN  The encounter with the patient was in  whole, or in part, for the following medical condition, which is the primary reason for home health care:  copd exacerbation  I certify that, based on my findings, the following services are medically necessary home health services:  Nursing  My clinical findings support the need for the above services:  Shortness of breath with activity  Further, I certify that my clinical findings support that this patient is homebound due to:  Shortness of Breath with activity  Reason for Medically Necessary Home Health Services:  Skilled Nursing- [facility]illed [facility]    Complete by:  As directed   To provide the following care/treatments:  RN     Increase activity slowly    Complete by:  As directed           Discharge Medication List as of 04/22/2014 12:45 PM    START taking these medications   Details  albuterol (PROVENTIL) (2.5 MG/3ML) 0.083% nebulizer solution Take 3 mLs (2.5 mg total) by nebulization every 6 (six) hours as needed for wheezing or shortness of breath., Starting 04/22/2014, Until Discontinued, Print    bismuth-metronidazole-tetracycline (PYLERA) 140-125-125 MG per capsule Take 3 capsules by mouth 4 (four) times daily -  before meals and at bedtime., Starting 04/22/2014, Until  Discontinued, No Print    guaiFENesin (MUCINEX) 600 MG 12 hr tablet Take 1 tablet (600 mg total) by mouth 2 (two) times daily., Starting 04/22/2014, Until Discontinued, Print    lisinopril (PRINIVIL,ZESTRIL) 5 MG tablet Take 1 tablet (5 mg total) by mouth daily., Starting 04/22/2014, Until Discontinued, Print    morphine (MS CONTIN) 30 MG 12 hr tablet Take 1 tablet (30 mg total) by mouth every 12 (twelve) hours., Starting 04/22/2014, Until Discontinued, Print    oxyCODONE (OXY IR/ROXICODONE) 5 MG immediate release tablet Take 1 tablet (5 mg total) by mouth every 6 (six) hours as needed for severe pain., Starting 04/22/2014, Until Discontinued, Print    pantoprazole (PROTONIX) 40 MG tablet  Take 1 tablet (40 mg total) by mouth 2 (two) times daily before a meal., Starting 04/22/2014, Until Discontinued, Print    polyethylene glycol (MIRALAX / GLYCOLAX) packet Take 17 g by mouth daily., Starting 04/22/2014, Until Discontinued, Print    predniSONE (DELTASONE) 10 MG tablet Take 40mg  po daily for 2 days then 30mg  po daily for 2 days then 20mg  po daily for 2 days then 10mg  po daily for 2 days then stop, Print      CONTINUE these medications which have NOT CHANGED   Details  acetaminophen (TYLENOL) 500 MG tablet Take 500 mg by mouth every 6 (six) hours as needed for mild pain or moderate pain., Until Discontinued, Historical Med    aspirin EC 325 MG tablet Take 325 mg by mouth daily., Until Discontinued, Historical Med    Cyanocobalamin (B-12 PO) Take 1 tablet by mouth daily., Until Discontinued, Historical Med    Multiple Vitamin (MULTIVITAMIN WITH MINERALS) TABS tablet Take 1 tablet by mouth daily., Until Discontinued, Historical Med      STOP taking these medications     ibuprofen (ADVIL,MOTRIN) 200 MG tablet        Allergies  Allergen Reactions  . Penicillins Hives   Follow-up Information   Follow up with [facility]. (appointment 04/27/14 at 10am- bring list of medications, and proof of household income)    Specialty:  Occupational Therapy   Contact information:   [address] [place] [address] [phone]        The results of significant diagnostics from this hospitalization (including imaging, microbiology, ancillary and laboratory) are listed below for reference.    Significant Diagnostic Studies: Dg Chest 2 View  04/15/2014   CLINICAL DATA:  Cough, congestion, weakness  EXAM: CHEST  2 VIEW  COMPARISON:  None.  FINDINGS: The heart size and mediastinal contours are within normal limits. Both lungs are clear. The visualized skeletal structures are unremarkable.  IMPRESSION: No active cardiopulmonary disease.   Electronically  Signed   By: Kathreen Devoid   On: 04/15/2014 17:11   Ct Chest W Contrast  04/18/2014   CLINICAL DATA:  Shortness of breath. Recently diagnosed with abdominal mass.  EXAM: CT CHEST WITH CONTRAST  TECHNIQUE: Multidetector CT imaging of the chest was performed during intravenous contrast administration.  CONTRAST:  76mL OMNIPAQUE IOHEXOL 300 MG/ML  SOLN  COMPARISON:  Plain films 04/15/2014. Abdominal pelvic CT 04/15/2014.  FINDINGS: Lungs/Pleura: Minimal subpleural right upper lobe density, including on image 14. Favored to represent subsegmental atelectasis. Bibasilar scarring.  Trace bilateral pleural thickening.  Heart/Mediastinum: No supraclavicular adenopathy. Small bilateral axillary nodes. Bovine arch. Normal heart size, without pericardial effusion. No hilar adenopathy. Mild retrocrural adenopathy. Example 8 mm on image 54. Borderline periesophageal adenopathy at 8  mm on image 27.  Upper Abdomen: Mild cirrhosis as evidenced by enlargement of the caudate and lateral segment left liver lobes. Prior splenectomy. Prominent right upper quadrant lymph nodes, likely reactive. Normal imaged portions of the gallbladder, pancreas, adrenal glands, kidneys.  Bones/Musculoskeletal: No acute osseous abnormality. Schmorl's node deformities involving the lower thoracic spine.  IMPRESSION: 1. No evidence of primary malignancy or metastatic disease within the chest. 2. Mild retrocrural adenopathy with borderline periesophageal adenopathy. Favored to be reactive. 3. Cirrhosis with likely secondary prominent upper abdominal nodes, incompletely imaged.   Electronically Signed   By: Abigail Miyamoto M.D.   On: 04/18/2014 16:20   Ct Abdomen Pelvis W Contrast  04/18/2014   ADDENDUM REPORT: 04/18/2014 16:43  ADDENDUM: Addended for further clarification:  Although unusual, a potential benign etiology for omental/peritoneal implants in the setting of splenectomy is widespread splenosis. If percutaneous biopsy is being considered, it may  be prudent to first exclude this diagnosis, due to the potential risk of bleeding.  If a primary malignancy is not identified, consider heat-treated tagged RBC scan or sulfur colloid nuclear medicine scan, with SPECT imaging for localization.   Electronically Signed   By: Julian Hy M.D.   On: 04/18/2014 16:43   04/18/2014   CLINICAL DATA:  Hervey Ard right-sided abdominal pain x 2-3 weeks, nausea/ vomiting/ diarrhea, dysuria. Prior splenectomy.  EXAM: CT ABDOMEN AND PELVIS WITH CONTRAST  TECHNIQUE: Multidetector CT imaging of the abdomen and pelvis was performed using the standard protocol following bolus administration of intravenous contrast.  CONTRAST:  46mL OMNIPAQUE IOHEXOL 300 MG/ML SOLN, 141mL OMNIPAQUE IOHEXOL 300 MG/ML SOLN  COMPARISON:  None.  FINDINGS: Lower chest:  Lung bases are clear.  Hepatobiliary: Mild prominence of the left hepatic lobe/ caudate, nonspecific but worrisome for cirrhosis. No focal hepatic lesions.  Possible tiny layering gallstone (series 2/image 30). No associated inflammatory changes. No intrahepatic or extrahepatic ductal dilatation.  Spleen: Surgically absent. 2.7 x 2.1 cm rounded lesion in the left upper abdomen may reflect residual splenic tissue (series 2/ image 10). Additional adjacent possible residual splenic tissue (series 2/image 11).  Pancreas: Within normal limits.  Stomach/Bowel: Mildly prominent soft tissue in the gastric cardia at the GE junction (series 2/image 15), possibly simply reflecting prominent gastric folds but persistent on delayed imaging.  No evidence of bowel obstruction.  Normal appendix.  No definite colonic wall thickening or mass is seen.  Adrenals/urinary tract: Adrenal glands are unremarkable.  Kidneys are within normal limits.  No hydronephrosis.  Vascular/Lymphatic: Atherosclerotic calcifications of the abdominal aorta and branch vessels.  Small upper abdominal lymph nodes, including a 16 mm short axis portacaval node (series 2/image 28),  and a 12 mm gastrohepatic node (series 2/ image 16), indeterminate in the setting of suspected cirrhosis.  Additional scattered small retroperitoneal nodes which do not meet pathologic CT size criteria but are increased in number.  Small retrocrural nodes measuring up to 8 mm short axis (series 2/image 11).  Reproductive: Prostate is unremarkable.  Musculoskeletal: Degenerative changes of the visualized thoracolumbar spine.  Other: No abdominopelvic ascites.  Scattered omental implants, measuring up to 13 mm cyst in the anterior left upper abdomen (series 2/image 24).  5.7 x 4.0 cm peritoneal implant in the lower pelvis (series 2/ image 72).  IMPRESSION: Peritoneal/omental disease, including a dominant 5.7 cm pelvic implant. Underlying primary malignancy is unclear from the current CT, although a nonvisualized GI primary would be the leading concern.  Possible mass at the gastric cardia/ GE junction, equivocal.  Consider upper endoscopy for further evaluation. If negative, colonoscopy is suggested.  Scattered small retrocrural, upper abdominal, and retroperitoneal nodes, as above.  Suspected cirrhosis. Cholelithiasis. Prior splenectomy with suspected residual splenic tissue in the left upper abdomen.  Electronically Signed: By: Julian Hy M.D. On: 04/15/2014 18:54   Ct Biopsy  04/19/2014   CLINICAL DATA:  48 year old male with cirrhosis and abdominal/pelvic pain. CT evaluation demonstrates multiple peritoneal nodules of similar density. The largest nodule is located dependently within the anatomic pelvis. The patient has a history of prior splenectomy. CT biopsy is warranted to confirm tissue diagnosis.  EXAM: CT BIOPSY  Date: 04/19/2014  PROCEDURE: 1. CT-guided biopsy of pelvic peritoneal mass Interventional Radiologist:  Criselda Peaches, MD  ANESTHESIA/SEDATION: Moderate (conscious) sedation was used. Three mg Versed, 150 mcg Fentanyl were administered intravenously. The patient's vital signs were  monitored continuously by radiology nursing throughout the procedure.  Sedation Time: 30 minutes  TECHNIQUE: Informed consent was obtained from the patient following explanation of the procedure, risks, benefits and alternatives. The patient understands, agrees and consents for the procedure. All questions were addressed. A time out was performed.  A planning axial CT scan was performed. The soft tissue nodule in the deep anatomic pelvis was successfully identified. A suitable left trans gluteal approach was identified. A skin entry site was selected and marked. The region was then sterilely prepped and draped in the standard fashion using Betadine skin prep.  Local anesthesia was attained by infiltration with 1% lidocaine. Using intermittent CT fluoroscopic guidance, a 17 gauge trocar needle was carefully advanced to the margin of the nodule. Unfortunately, the nodule was firm and freely mobile and difficult to appears. Multiple 18 gauge core biopsies were attempted using the BioPince automated biopsy device. Despite good positioning of the needle, the biopsy fragments returned were very min secondary to attempt in 1.3 cm throws. Therefore, the under trocar was repositioned multiple times in an effort to connective the wider portion of the soft tissue mass. This proved extremely challenging given the mobile nature of the mass.  Core specimens were placed in saline and delivered to pathology for further evaluation. Post biopsy axial CT imaging demonstrates minimal hemorrhage in the perirectal fat. There are some locules of air the adjacent to the biopsy site.  COMPLICATIONS: None immediate.  IMPRESSION: 1. Challenging, but ultimately technically successful biopsy of deep pelvic peritoneal mass. The mass was freely mobile and very challenging to target. 2. Due to multiple repositioned is required for needle placement and the close proximity of the biopsy site to the rectum, consider prophylactic antibiotics versus  close clinical monitoring as there is is at least a theoretical possibility of inadvertent rectal puncture. Findings and recommendations were discussed with Dr. Caryn Section by Dr. Laurence Ferrari at approximately noon on 04/19/2014.  Signed,  Criselda Peaches, MD  [facility]  [facility]   Electronically Signed   By: Jacqulynn Cadet M.D.   On: 04/19/2014 14:26   Dg Chest Port 1 View  04/21/2014   CLINICAL DATA:  Shortness of breath.  EXAM: PORTABLE CHEST - 1 VIEW  COMPARISON:  Chest radiograph 04/15/2014  FINDINGS: Stable cardiac and mediastinal contours. No consolidative pulmonary opacities. No pleural effusion or pneumothorax. Regional skeleton is unremarkable.  IMPRESSION: No acute cardiopulmonary process.   Electronically Signed   By: Lovey Newcomer M.D.   On: 04/21/2014 21:24    Microbiology: No results found for this or any previous visit (from the past 240 hour(s)).  Labs: Basic Metabolic Panel:  Recent Labs Lab 04/15/14 2133 04/16/14 0549 04/17/14 0545 04/21/14 0520  NA  --  138 141 139  K  --  4.3 3.9 4.1  CL  --  103 101 98  CO2  --  24 27 31   GLUCOSE  --  149* 135* 112*  BUN  --  10 8 17   CREATININE 0.87 0.83 0.74 0.78  CALCIUM  --  8.6 8.4 8.9   Liver Function Tests:  Recent Labs Lab 04/16/14 0549 04/17/14 0545 04/20/14 1105  AST 106* 73* 128*  ALT 144* 124* 196*  ALKPHOS 57 52 53  BILITOT 0.3 0.3 0.3  PROT 7.1 7.0 7.0  ALBUMIN 3.5 3.7 3.6   No results found for this basename: LIPASE, AMYLASE,  in the last 168 hours No results found for this basename: AMMONIA,  in the last 168 hours CBC:  Recent Labs Lab 04/16/14 0549 04/17/14 0545 04/18/14 0723 04/19/14 0559 04/21/14 0520  WBC 7.7 19.5* 19.2* 19.5* 20.5*  HGB 16.7 15.4 15.5 15.6 17.0  HCT 50.5 47.5 46.6 48.0 49.9  MCV 98.2 98.3 97.9 98.4 96.5  PLT 225 234 228 231 238   Cardiac Enzymes: No results found for this basename: CKTOTAL, CKMB, CKMBINDEX,  TROPONINI,  in the last 168 hours BNP: BNP (last 3 results) No results found for this basename: PROBNP,  in the last 8760 hours CBG:  Recent Labs Lab 04/21/14 1124 04/21/14 1657 04/21/14 2029 04/22/14 0752 04/22/14 1202  GLUCAP 103* 123* 117* 90 105*       Signed:  MEMON,JEHANZEB  [facility] 04/22/2014, 8:17 PM

## 2014-04-22 NOTE — H&P (Deleted)
Physician Discharge Summary  Jeremy Mcgee QJJ:[id number] DOB: 1966/06/20 DOA: 04/15/2014  PCP: No PCP Per Patient  Admit date: 04/15/2014 Discharge date: 04/22/2014  Time spent: 40 minutes  Recommendations for Outpatient Follow-up:  1. Patient was set up with home health services. 2. 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 He had a CT scan of the abdomen and pelvis done which revealed peritoneal/omental disease concerning for malignancy. It also revealed a possible GE/gastric mass. Gastroenterology was consulted. Workup revealed no gastric mass, but a hiatal hernia and gastric biopsies taken. Colonoscopy revealed colon polyps which were not biopsied. Oncology was consulted and ordered interventional radiology to biopsy one of the omental masses. This was done on 9/22 with results indicating splenic tissue. It was likely that he had accessory spleen is omentum. Further workup was not felt necessary by oncology. Case was  discussed with general surgery, Dr. Arnoldo Morale to see if any surgical management would be required for his pain relief, but it is felt more appropriate to treat the patient with pain medications. It was not felt that surgical management would be needed at this time. Since that time, patient did have significant abdominal pain which was managed by oral opiates. Further workup indicated elevated liver function tests while imaging confirmed evidence of cirrhosis. He was noted to have a positive hepatitis C antibody with elevated hepatitis C RNA. He will followup with gastroenterology for further management. Gastric biopsies were also positive for H. pylori and he is given appropriate treatment. Regarding his COPD, he was started on intravenous steroids and was transitioned to prednisone. He was ambulating in the halls and did not qualify for supplemental oxygen. Patient is ready for discharge home.   Procedures: 04/19/14: Omental/peritoneal mass biopsy, pending  04/17/14: Colonoscopy per Dr. Doristine Mango left colon; 4 colon polyps; internal hemorrhoids.  04/16/14: EGD per Dr. Pennie Banter hernia; gastritis; small amount of retained food in the stomach; no source for metastatic disease identified; biopsy pending. Echo:- Left ventricle: The cavity size was normal. Wall thickness was increased in a pattern of mild LVH. Systolic function was vigorous. The estimated ejection fraction was in the range of 65% to 70%. Features are consistent with a pseudonormal left ventricular filling pattern, with concomitant abnormal relaxation and increased filling pressure (grade 2 diastolic dysfunction). - Left atrium: The atrium was moderately dilated. - Right ventricle: The cavity size was mildly dilated. - Right atrium: The atrium was mildly dilated. - Technically difficult study.   Consultations:  Oncology  Gastroenterology  Interventional radiology  Discharge Exam: Filed Vitals:   04/22/14 0547  BP:  136/71  Pulse: 84  Temp: 97.9 F (36.6 C)  Resp: 20    General: NAD Cardiovascular: S1, S2 RRR Respiratory: CTA B  Discharge Instructions You were cared for by a hospitalist during your hospital stay. If you have any questions about your discharge medications or the care you received while you were in the hospital after you are discharged, you can call the unit and asked to speak with the hospitalist on call if the hospitalist that took care of you is not available. Once you are discharged, your primary care physician will handle any further medical issues. Please note that NO REFILLS for any discharge medications will be authorized once you are discharged, as it is imperative that you return to your primary care physician (or establish a relationship with a primary care physician if you do not have one) for your aftercare needs so that they can reassess your need for medications and monitor your lab values.  Discharge Instructions   Call MD for:  difficulty breathing, headache or visual disturbances    Complete by:  As directed      Call MD for:  severe uncontrolled pain    Complete by:  As directed      Call MD for:  temperature >100.4    Complete by:  As directed      DME Nebulizer machine    Complete by:  As directed      Diet - low sodium heart healthy    Complete by:  As directed      Face-to-face encounter (required for Medicare/Medicaid patients)    Complete by:  As directed   I [facility] certify that this patient is under my care and that I, or a nurse practitioner or physician's assistant working with me, had a face-to-face encounter that meets the physician face-to-face encounter requirements with this patient on 04/22/2014. The encounter with the patient was in whole, or in part for the following medical condition(s) which is the primary reason for home health care (List medical condition): copd exacerbation, needs home health RN  The encounter with the patient was in  whole, or in part, for the following medical condition, which is the primary reason for home health care:  copd exacerbation  I certify that, based on my findings, the following services are medically necessary home health services:  Nursing  My clinical findings support the need for the above services:  Shortness of breath with activity  Further, I certify that my clinical findings support that this patient is homebound due to:  Shortness of Breath with activity  Reason for Medically Necessary Home Health Services:  Skilled Nursing- [facility]illed [place]    Complete by:  As directed   To provide the following care/treatments:  RN     Increase activity slowly    Complete by:  As directed           Discharge Medication List as of 04/22/2014 12:45 PM    START taking these medications   Details  albuterol (PROVENTIL) (2.5 MG/3ML) 0.083% nebulizer solution Take 3 mLs (2.5 mg total) by nebulization every 6 (six) hours as needed for wheezing or shortness of breath., Starting 04/22/2014, Until Discontinued, Print    bismuth-metronidazole-tetracycline (PYLERA) 140-125-125 MG per capsule Take 3 capsules by mouth 4 (four) times daily -  before meals and at bedtime., Starting 04/22/2014, Until  Discontinued, No Print    guaiFENesin (MUCINEX) 600 MG 12 hr tablet Take 1 tablet (600 mg total) by mouth 2 (two) times daily., Starting 04/22/2014, Until Discontinued, Print    lisinopril (PRINIVIL,ZESTRIL) 5 MG tablet Take 1 tablet (5 mg total) by mouth daily., Starting 04/22/2014, Until Discontinued, Print    morphine (MS CONTIN) 30 MG 12 hr tablet Take 1 tablet (30 mg total) by mouth every 12 (twelve) hours., Starting 04/22/2014, Until Discontinued, Print    oxyCODONE (OXY IR/ROXICODONE) 5 MG immediate release tablet Take 1 tablet (5 mg total) by mouth every 6 (six) hours as needed for severe pain., Starting 04/22/2014, Until Discontinued, Print    pantoprazole (PROTONIX) 40 MG tablet  Take 1 tablet (40 mg total) by mouth 2 (two) times daily before a meal., Starting 04/22/2014, Until Discontinued, Print    polyethylene glycol (MIRALAX / GLYCOLAX) packet Take 17 g by mouth daily., Starting 04/22/2014, Until Discontinued, Print    predniSONE (DELTASONE) 10 MG tablet Take 40mg  po daily for 2 days then 30mg  po daily for 2 days then 20mg  po daily for 2 days then 10mg  po daily for 2 days then stop, Print      CONTINUE these medications which have NOT CHANGED   Details  acetaminophen (TYLENOL) 500 MG tablet Take 500 mg by mouth every 6 (six) hours as needed for mild pain or moderate pain., Until Discontinued, Historical Med    aspirin EC 325 MG tablet Take 325 mg by mouth daily., Until Discontinued, Historical Med    Cyanocobalamin (B-12 PO) Take 1 tablet by mouth daily., Until Discontinued, Historical Med    Multiple Vitamin (MULTIVITAMIN WITH MINERALS) TABS tablet Take 1 tablet by mouth daily., Until Discontinued, Historical Med      STOP taking these medications     ibuprofen (ADVIL,MOTRIN) 200 MG tablet        Allergies  Allergen Reactions  . Penicillins Hives   Follow-up Information   Follow up with [facility]. (appointment 04/27/14 at 10am- bring list of medications, and proof of household income)    Specialty:  Occupational Therapy   Contact information:   [address] [place] [address] [phone]        The results of significant diagnostics from this hospitalization (including imaging, microbiology, ancillary and laboratory) are listed below for reference.    Significant Diagnostic Studies: Dg Chest 2 View  04/15/2014   CLINICAL DATA:  Cough, congestion, weakness  EXAM: CHEST  2 VIEW  COMPARISON:  None.  FINDINGS: The heart size and mediastinal contours are within normal limits. Both lungs are clear. The visualized skeletal structures are unremarkable.  IMPRESSION: No active cardiopulmonary disease.   Electronically  Signed   By: Kathreen Devoid   On: 04/15/2014 17:11   Ct Chest W Contrast  04/18/2014   CLINICAL DATA:  Shortness of breath. Recently diagnosed with abdominal mass.  EXAM: CT CHEST WITH CONTRAST  TECHNIQUE: Multidetector CT imaging of the chest was performed during intravenous contrast administration.  CONTRAST:  52mL OMNIPAQUE IOHEXOL 300 MG/ML  SOLN  COMPARISON:  Plain films 04/15/2014. Abdominal pelvic CT 04/15/2014.  FINDINGS: Lungs/Pleura: Minimal subpleural right upper lobe density, including on image 14. Favored to represent subsegmental atelectasis. Bibasilar scarring.  Trace bilateral pleural thickening.  Heart/Mediastinum: No supraclavicular adenopathy. Small bilateral axillary nodes. Bovine arch. Normal heart size, without pericardial effusion. No hilar adenopathy. Mild retrocrural adenopathy. Example 8 mm on image 54. Borderline periesophageal adenopathy at 8  mm on image 27.  Upper Abdomen: Mild cirrhosis as evidenced by enlargement of the caudate and lateral segment left liver lobes. Prior splenectomy. Prominent right upper quadrant lymph nodes, likely reactive. Normal imaged portions of the gallbladder, pancreas, adrenal glands, kidneys.  Bones/Musculoskeletal: No acute osseous abnormality. Schmorl's node deformities involving the lower thoracic spine.  IMPRESSION: 1. No evidence of primary malignancy or metastatic disease within the chest. 2. Mild retrocrural adenopathy with borderline periesophageal adenopathy. Favored to be reactive. 3. Cirrhosis with likely secondary prominent upper abdominal nodes, incompletely imaged.   Electronically Signed   By: Abigail Miyamoto M.D.   On: 04/18/2014 16:20   Ct Abdomen Pelvis W Contrast  04/18/2014   ADDENDUM REPORT: 04/18/2014 16:43  ADDENDUM: Addended for further clarification:  Although unusual, a potential benign etiology for omental/peritoneal implants in the setting of splenectomy is widespread splenosis. If percutaneous biopsy is being considered, it may  be prudent to first exclude this diagnosis, due to the potential risk of bleeding.  If a primary malignancy is not identified, consider heat-treated tagged RBC scan or sulfur colloid nuclear medicine scan, with SPECT imaging for localization.   Electronically Signed   By: Julian Hy M.D.   On: 04/18/2014 16:43   04/18/2014   CLINICAL DATA:  Hervey Ard right-sided abdominal pain x 2-3 weeks, nausea/ vomiting/ diarrhea, dysuria. Prior splenectomy.  EXAM: CT ABDOMEN AND PELVIS WITH CONTRAST  TECHNIQUE: Multidetector CT imaging of the abdomen and pelvis was performed using the standard protocol following bolus administration of intravenous contrast.  CONTRAST:  34mL OMNIPAQUE IOHEXOL 300 MG/ML SOLN, 173mL OMNIPAQUE IOHEXOL 300 MG/ML SOLN  COMPARISON:  None.  FINDINGS: Lower chest:  Lung bases are clear.  Hepatobiliary: Mild prominence of the left hepatic lobe/ caudate, nonspecific but worrisome for cirrhosis. No focal hepatic lesions.  Possible tiny layering gallstone (series 2/image 30). No associated inflammatory changes. No intrahepatic or extrahepatic ductal dilatation.  Spleen: Surgically absent. 2.7 x 2.1 cm rounded lesion in the left upper abdomen may reflect residual splenic tissue (series 2/ image 10). Additional adjacent possible residual splenic tissue (series 2/image 11).  Pancreas: Within normal limits.  Stomach/Bowel: Mildly prominent soft tissue in the gastric cardia at the GE junction (series 2/image 15), possibly simply reflecting prominent gastric folds but persistent on delayed imaging.  No evidence of bowel obstruction.  Normal appendix.  No definite colonic wall thickening or mass is seen.  Adrenals/urinary tract: Adrenal glands are unremarkable.  Kidneys are within normal limits.  No hydronephrosis.  Vascular/Lymphatic: Atherosclerotic calcifications of the abdominal aorta and branch vessels.  Small upper abdominal lymph nodes, including a 16 mm short axis portacaval node (series 2/image 28),  and a 12 mm gastrohepatic node (series 2/ image 16), indeterminate in the setting of suspected cirrhosis.  Additional scattered small retroperitoneal nodes which do not meet pathologic CT size criteria but are increased in number.  Small retrocrural nodes measuring up to 8 mm short axis (series 2/image 11).  Reproductive: Prostate is unremarkable.  Musculoskeletal: Degenerative changes of the visualized thoracolumbar spine.  Other: No abdominopelvic ascites.  Scattered omental implants, measuring up to 13 mm cyst in the anterior left upper abdomen (series 2/image 24).  5.7 x 4.0 cm peritoneal implant in the lower pelvis (series 2/ image 72).  IMPRESSION: Peritoneal/omental disease, including a dominant 5.7 cm pelvic implant. Underlying primary malignancy is unclear from the current CT, although a nonvisualized GI primary would be the leading concern.  Possible mass at the gastric cardia/ GE junction, equivocal.  Consider upper endoscopy for further evaluation. If negative, colonoscopy is suggested.  Scattered small retrocrural, upper abdominal, and retroperitoneal nodes, as above.  Suspected cirrhosis. Cholelithiasis. Prior splenectomy with suspected residual splenic tissue in the left upper abdomen.  Electronically Signed: By: Julian Hy M.D. On: 04/15/2014 18:54   Ct Biopsy  04/19/2014   CLINICAL DATA:  48 year old male with cirrhosis and abdominal/pelvic pain. CT evaluation demonstrates multiple peritoneal nodules of similar density. The largest nodule is located dependently within the anatomic pelvis. The patient has a history of prior splenectomy. CT biopsy is warranted to confirm tissue diagnosis.  EXAM: CT BIOPSY  Date: 04/19/2014  PROCEDURE: 1. CT-guided biopsy of pelvic peritoneal mass Interventional Radiologist:  Criselda Peaches, MD  ANESTHESIA/SEDATION: Moderate (conscious) sedation was used. Three mg Versed, 150 mcg Fentanyl were administered intravenously. The patient's vital signs were  monitored continuously by radiology nursing throughout the procedure.  Sedation Time: 30 minutes  TECHNIQUE: Informed consent was obtained from the patient following explanation of the procedure, risks, benefits and alternatives. The patient understands, agrees and consents for the procedure. All questions were addressed. A time out was performed.  A planning axial CT scan was performed. The soft tissue nodule in the deep anatomic pelvis was successfully identified. A suitable left trans gluteal approach was identified. A skin entry site was selected and marked. The region was then sterilely prepped and draped in the standard fashion using Betadine skin prep.  Local anesthesia was attained by infiltration with 1% lidocaine. Using intermittent CT fluoroscopic guidance, a 17 gauge trocar needle was carefully advanced to the margin of the nodule. Unfortunately, the nodule was firm and freely mobile and difficult to appears. Multiple 18 gauge core biopsies were attempted using the BioPince automated biopsy device. Despite good positioning of the needle, the biopsy fragments returned were very min secondary to attempt in 1.3 cm throws. Therefore, the under trocar was repositioned multiple times in an effort to connective the wider portion of the soft tissue mass. This proved extremely challenging given the mobile nature of the mass.  Core specimens were placed in saline and delivered to pathology for further evaluation. Post biopsy axial CT imaging demonstrates minimal hemorrhage in the perirectal fat. There are some locules of air the adjacent to the biopsy site.  COMPLICATIONS: None immediate.  IMPRESSION: 1. Challenging, but ultimately technically successful biopsy of deep pelvic peritoneal mass. The mass was freely mobile and very challenging to target. 2. Due to multiple repositioned is required for needle placement and the close proximity of the biopsy site to the rectum, consider prophylactic antibiotics versus  close clinical monitoring as there is is at least a theoretical possibility of inadvertent rectal puncture. Findings and recommendations were discussed with Dr. Caryn Section by Dr. Laurence Ferrari at approximately noon on 04/19/2014.  Signed,  Criselda Peaches, MD  [facility]  [facility]   Electronically Signed   By: Jacqulynn Cadet M.D.   On: 04/19/2014 14:26   Dg Chest Port 1 View  04/21/2014   CLINICAL DATA:  Shortness of breath.  EXAM: PORTABLE CHEST - 1 VIEW  COMPARISON:  Chest radiograph 04/15/2014  FINDINGS: Stable cardiac and mediastinal contours. No consolidative pulmonary opacities. No pleural effusion or pneumothorax. Regional skeleton is unremarkable.  IMPRESSION: No acute cardiopulmonary process.   Electronically Signed   By: Lovey Newcomer M.D.   On: 04/21/2014 21:24    Microbiology: No results found for this or any previous visit (from the past 240 hour(s)).  Labs: Basic Metabolic Panel:  Recent Labs Lab 04/15/14 2133 04/16/14 0549 04/17/14 0545 04/21/14 0520  NA  --  138 141 139  K  --  4.3 3.9 4.1  CL  --  103 101 98  CO2  --  24 27 31   GLUCOSE  --  149* 135* 112*  BUN  --  10 8 17   CREATININE 0.87 0.83 0.74 0.78  CALCIUM  --  8.6 8.4 8.9   Liver Function Tests:  Recent Labs Lab 04/16/14 0549 04/17/14 0545 04/20/14 1105  AST 106* 73* 128*  ALT 144* 124* 196*  ALKPHOS 57 52 53  BILITOT 0.3 0.3 0.3  PROT 7.1 7.0 7.0  ALBUMIN 3.5 3.7 3.6   No results found for this basename: LIPASE, AMYLASE,  in the last 168 hours No results found for this basename: AMMONIA,  in the last 168 hours CBC:  Recent Labs Lab 04/16/14 0549 04/17/14 0545 04/18/14 0723 04/19/14 0559 04/21/14 0520  WBC 7.7 19.5* 19.2* 19.5* 20.5*  HGB 16.7 15.4 15.5 15.6 17.0  HCT 50.5 47.5 46.6 48.0 49.9  MCV 98.2 98.3 97.9 98.4 96.5  PLT 225 234 228 231 238   Cardiac Enzymes: No results found for this basename: CKTOTAL, CKMB, CKMBINDEX,  TROPONINI,  in the last 168 hours BNP: BNP (last 3 results) No results found for this basename: PROBNP,  in the last 8760 hours CBG:  Recent Labs Lab 04/21/14 1124 04/21/14 1657 04/21/14 2029 04/22/14 0752 04/22/14 1202  GLUCAP 103* 123* 117* 90 105*       Signed:  Nayelis Bonito  [facility] 04/22/2014, 8:25 PM

## 2014-04-22 NOTE — Plan of Care (Signed)
Assessment for home O2:  O2 sats room air at rest 94%  O2 Sats RA with ambulation: 91-95%   During ambulation the patient did have to stop to rest due to dyspnea, but his O2 sats remained 93%.  MD made aware of the assessment.  No new orders given at this time.

## 2014-04-22 NOTE — Progress Notes (Signed)
Patient discharged with instructions, prescription, and care notes.  Verbalized understanding via teach back.  IV was removed and the site was WNL. Patient voiced no further complaints or concerns at the time of discharge.  Appointments scheduled per instructions.  Patient left the floor via w/c with staff and family in stable condition. 

## 2014-04-22 NOTE — Discharge Summary (Signed)
Physician Discharge Summary  Jeremy Mcgee MHD:[id number] DOB: June 08, 1966 DOA: 04/15/2014  PCP: No PCP Per Patient  Admit date: 04/15/2014 Discharge date: 04/22/2014  Time spent: 40 minutes  Recommendations for Outpatient Follow-up:  1. Patient was set up with home health services. 2. 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 He had a CT scan of the abdomen and pelvis done which revealed peritoneal/omental disease concerning for malignancy. It also revealed a possible GE/gastric mass. Gastroenterology was consulted. Workup revealed no gastric mass, but a hiatal hernia and gastric biopsies taken. Colonoscopy revealed colon polyps which were not biopsied. Oncology was consulted and ordered interventional radiology to biopsy one of the omental masses. This was done on 9/22 with results indicating splenic tissue. It was likely that he had accessory spleen is omentum. Further workup was not felt necessary by oncology. Case was  discussed with general surgery, Dr. Arnoldo Morale to see if any surgical management would be required for his pain relief, but it is felt more appropriate to treat the patient with pain medications. It was not felt that surgical management would be needed at this time. Since that time, patient did have significant abdominal pain which was managed by oral opiates. Further workup indicated elevated liver function tests while imaging confirmed evidence of cirrhosis. He was noted to have a positive hepatitis C antibody with elevated hepatitis C RNA. He will followup with gastroenterology for further management. Gastric biopsies were also positive for H. pylori and he is given appropriate treatment. Regarding his COPD, he was started on intravenous steroids and was transitioned to prednisone. He was ambulating in the halls and did not qualify for supplemental oxygen. Patient is ready for discharge home.   Procedures: 04/19/14: Omental/peritoneal mass biopsy, pending  04/17/14: Colonoscopy per Dr. Doristine Mango left colon; 4 colon polyps; internal hemorrhoids.  04/16/14: EGD per Dr. Pennie Banter hernia; gastritis; small amount of retained food in the stomach; no source for metastatic disease identified; biopsy pending. Echo:- Left ventricle: The cavity size was normal. Wall thickness was increased in a pattern of mild LVH. Systolic function was vigorous. The estimated ejection fraction was in the range of 65% to 70%. Features are consistent with a pseudonormal left ventricular filling pattern, with concomitant abnormal relaxation and increased filling pressure (grade 2 diastolic dysfunction). - Left atrium: The atrium was moderately dilated. - Right ventricle: The cavity size was mildly dilated. - Right atrium: The atrium was mildly dilated. - Technically difficult study.   Consultations:  Oncology  Gastroenterology  Interventional radiology  Discharge Exam: Filed Vitals:   04/22/14 0547  BP:  136/71  Pulse: 84  Temp: 97.9 F (36.6 C)  Resp: 20    General: NAD Cardiovascular: S1, S2 RRR Respiratory: CTA B  Discharge Instructions You were cared for by a hospitalist during your hospital stay. If you have any questions about your discharge medications or the care you received while you were in the hospital after you are discharged, you can call the unit and asked to speak with the hospitalist on call if the hospitalist that took care of you is not available. Once you are discharged, your primary care physician will handle any further medical issues. Please note that NO REFILLS for any discharge medications will be authorized once you are discharged, as it is imperative that you return to your primary care physician (or establish a relationship with a primary care physician if you do not have one) for your aftercare needs so that they can reassess your need for medications and monitor your lab values.  Discharge Instructions   Call MD for:  difficulty breathing, headache or visual disturbances    Complete by:  As directed      Call MD for:  severe uncontrolled pain    Complete by:  As directed      Call MD for:  temperature >100.4    Complete by:  As directed      DME Nebulizer machine    Complete by:  As directed      Diet - low sodium heart healthy    Complete by:  As directed      Face-to-face encounter (required for Medicare/Medicaid patients)    Complete by:  As directed   I MEMON,JEHANZEB certify that this patient is under my care and that I, or a nurse practitioner or physician's assistant working with me, had a face-to-face encounter that meets the physician face-to-face encounter requirements with this patient on 04/22/2014. The encounter with the patient was in whole, or in part for the following medical condition(s) which is the primary reason for home health care (List medical condition): copd exacerbation, needs home health RN  The encounter with the patient was in  whole, or in part, for the following medical condition, which is the primary reason for home health care:  copd exacerbation  I certify that, based on my findings, the following services are medically necessary home health services:  Nursing  My clinical findings support the need for the above services:  Shortness of breath with activity  Further, I certify that my clinical findings support that this patient is homebound due to:  Shortness of Breath with activity  Reason for Medically Necessary Home Health Services:  Skilled Nursing- Skilled [place]    Complete by:  As directed   To provide the following care/treatments:  RN     Increase activity slowly    Complete by:  As directed           Discharge Medication List as of 04/22/2014 12:45 PM    START taking these medications   Details  albuterol (PROVENTIL) (2.5 MG/3ML) 0.083% nebulizer solution Take 3 mLs (2.5 mg total) by nebulization every 6 (six) hours as needed for wheezing or shortness of breath., Starting 04/22/2014, Until Discontinued, Print    bismuth-metronidazole-tetracycline (PYLERA) 140-125-125 MG per capsule Take 3 capsules by mouth 4 (four) times daily -  before meals and at bedtime., Starting 04/22/2014, Until  Discontinued, No Print    guaiFENesin (MUCINEX) 600 MG 12 hr tablet Take 1 tablet (600 mg total) by mouth 2 (two) times daily., Starting 04/22/2014, Until Discontinued, Print    lisinopril (PRINIVIL,ZESTRIL) 5 MG tablet Take 1 tablet (5 mg total) by mouth daily., Starting 04/22/2014, Until Discontinued, Print    morphine (MS CONTIN) 30 MG 12 hr tablet Take 1 tablet (30 mg total) by mouth every 12 (twelve) hours., Starting 04/22/2014, Until Discontinued, Print    oxyCODONE (OXY IR/ROXICODONE) 5 MG immediate release tablet Take 1 tablet (5 mg total) by mouth every 6 (six) hours as needed for severe pain., Starting 04/22/2014, Until Discontinued, Print    pantoprazole (PROTONIX) 40 MG tablet  Take 1 tablet (40 mg total) by mouth 2 (two) times daily before a meal., Starting 04/22/2014, Until Discontinued, Print    polyethylene glycol (MIRALAX / GLYCOLAX) packet Take 17 g by mouth daily., Starting 04/22/2014, Until Discontinued, Print    predniSONE (DELTASONE) 10 MG tablet Take 40mg  po daily for 2 days then 30mg  po daily for 2 days then 20mg  po daily for 2 days then 10mg  po daily for 2 days then stop, Print      CONTINUE these medications which have NOT CHANGED   Details  acetaminophen (TYLENOL) 500 MG tablet Take 500 mg by mouth every 6 (six) hours as needed for mild pain or moderate pain., Until Discontinued, Historical Med    aspirin EC 325 MG tablet Take 325 mg by mouth daily., Until Discontinued, Historical Med    Cyanocobalamin (B-12 PO) Take 1 tablet by mouth daily., Until Discontinued, Historical Med    Multiple Vitamin (MULTIVITAMIN WITH MINERALS) TABS tablet Take 1 tablet by mouth daily., Until Discontinued, Historical Med      STOP taking these medications     ibuprofen (ADVIL,MOTRIN) 200 MG tablet        Allergies  Allergen Reactions  . Penicillins Hives   Follow-up Information   Follow up with [facility]. (appointment 04/27/14 at 10am- bring list of medications, and proof of household income)    Specialty:  Occupational Therapy   Contact information:   [address] [place] [address] [phone]        The results of significant diagnostics from this hospitalization (including imaging, microbiology, ancillary and laboratory) are listed below for reference.    Significant Diagnostic Studies: Dg Chest 2 View  04/15/2014   CLINICAL DATA:  Cough, congestion, weakness  EXAM: CHEST  2 VIEW  COMPARISON:  None.  FINDINGS: The heart size and mediastinal contours are within normal limits. Both lungs are clear. The visualized skeletal structures are unremarkable.  IMPRESSION: No active cardiopulmonary disease.   Electronically  Signed   By: Kathreen Devoid   On: 04/15/2014 17:11   Ct Chest W Contrast  04/18/2014   CLINICAL DATA:  Shortness of breath. Recently diagnosed with abdominal mass.  EXAM: CT CHEST WITH CONTRAST  TECHNIQUE: Multidetector CT imaging of the chest was performed during intravenous contrast administration.  CONTRAST:  70mL OMNIPAQUE IOHEXOL 300 MG/ML  SOLN  COMPARISON:  Plain films 04/15/2014. Abdominal pelvic CT 04/15/2014.  FINDINGS: Lungs/Pleura: Minimal subpleural right upper lobe density, including on image 14. Favored to represent subsegmental atelectasis. Bibasilar scarring.  Trace bilateral pleural thickening.  Heart/Mediastinum: No supraclavicular adenopathy. Small bilateral axillary nodes. Bovine arch. Normal heart size, without pericardial effusion. No hilar adenopathy. Mild retrocrural adenopathy. Example 8 mm on image 54. Borderline periesophageal adenopathy at 8  mm on image 27.  Upper Abdomen: Mild cirrhosis as evidenced by enlargement of the caudate and lateral segment left liver lobes. Prior splenectomy. Prominent right upper quadrant lymph nodes, likely reactive. Normal imaged portions of the gallbladder, pancreas, adrenal glands, kidneys.  Bones/Musculoskeletal: No acute osseous abnormality. Schmorl's node deformities involving the lower thoracic spine.  IMPRESSION: 1. No evidence of primary malignancy or metastatic disease within the chest. 2. Mild retrocrural adenopathy with borderline periesophageal adenopathy. Favored to be reactive. 3. Cirrhosis with likely secondary prominent upper abdominal nodes, incompletely imaged.   Electronically Signed   By: Abigail Miyamoto M.D.   On: 04/18/2014 16:20   Ct Abdomen Pelvis W Contrast  04/18/2014   ADDENDUM REPORT: 04/18/2014 16:43  ADDENDUM: Addended for further clarification:  Although unusual, a potential benign etiology for omental/peritoneal implants in the setting of splenectomy is widespread splenosis. If percutaneous biopsy is being considered, it may  be prudent to first exclude this diagnosis, due to the potential risk of bleeding.  If a primary malignancy is not identified, consider heat-treated tagged RBC scan or sulfur colloid nuclear medicine scan, with SPECT imaging for localization.   Electronically Signed   By: Julian Hy M.D.   On: 04/18/2014 16:43   04/18/2014   CLINICAL DATA:  Hervey Ard right-sided abdominal pain x 2-3 weeks, nausea/ vomiting/ diarrhea, dysuria. Prior splenectomy.  EXAM: CT ABDOMEN AND PELVIS WITH CONTRAST  TECHNIQUE: Multidetector CT imaging of the abdomen and pelvis was performed using the standard protocol following bolus administration of intravenous contrast.  CONTRAST:  44mL OMNIPAQUE IOHEXOL 300 MG/ML SOLN, 171mL OMNIPAQUE IOHEXOL 300 MG/ML SOLN  COMPARISON:  None.  FINDINGS: Lower chest:  Lung bases are clear.  Hepatobiliary: Mild prominence of the left hepatic lobe/ caudate, nonspecific but worrisome for cirrhosis. No focal hepatic lesions.  Possible tiny layering gallstone (series 2/image 30). No associated inflammatory changes. No intrahepatic or extrahepatic ductal dilatation.  Spleen: Surgically absent. 2.7 x 2.1 cm rounded lesion in the left upper abdomen may reflect residual splenic tissue (series 2/ image 10). Additional adjacent possible residual splenic tissue (series 2/image 11).  Pancreas: Within normal limits.  Stomach/Bowel: Mildly prominent soft tissue in the gastric cardia at the GE junction (series 2/image 15), possibly simply reflecting prominent gastric folds but persistent on delayed imaging.  No evidence of bowel obstruction.  Normal appendix.  No definite colonic wall thickening or mass is seen.  Adrenals/urinary tract: Adrenal glands are unremarkable.  Kidneys are within normal limits.  No hydronephrosis.  Vascular/Lymphatic: Atherosclerotic calcifications of the abdominal aorta and branch vessels.  Small upper abdominal lymph nodes, including a 16 mm short axis portacaval node (series 2/image 28),  and a 12 mm gastrohepatic node (series 2/ image 16), indeterminate in the setting of suspected cirrhosis.  Additional scattered small retroperitoneal nodes which do not meet pathologic CT size criteria but are increased in number.  Small retrocrural nodes measuring up to 8 mm short axis (series 2/image 11).  Reproductive: Prostate is unremarkable.  Musculoskeletal: Degenerative changes of the visualized thoracolumbar spine.  Other: No abdominopelvic ascites.  Scattered omental implants, measuring up to 13 mm cyst in the anterior left upper abdomen (series 2/image 24).  5.7 x 4.0 cm peritoneal implant in the lower pelvis (series 2/ image 72).  IMPRESSION: Peritoneal/omental disease, including a dominant 5.7 cm pelvic implant. Underlying primary malignancy is unclear from the current CT, although a nonvisualized GI primary would be the leading concern.  Possible mass at the gastric cardia/ GE junction, equivocal.  Consider upper endoscopy for further evaluation. If negative, colonoscopy is suggested.  Scattered small retrocrural, upper abdominal, and retroperitoneal nodes, as above.  Suspected cirrhosis. Cholelithiasis. Prior splenectomy with suspected residual splenic tissue in the left upper abdomen.  Electronically Signed: By: Julian Hy M.D. On: 04/15/2014 18:54   Ct Biopsy  04/19/2014   CLINICAL DATA:  48 year old male with cirrhosis and abdominal/pelvic pain. CT evaluation demonstrates multiple peritoneal nodules of similar density. The largest nodule is located dependently within the anatomic pelvis. The patient has a history of prior splenectomy. CT biopsy is warranted to confirm tissue diagnosis.  EXAM: CT BIOPSY  Date: 04/19/2014  PROCEDURE: 1. CT-guided biopsy of pelvic peritoneal mass Interventional Radiologist:  Criselda Peaches, MD  ANESTHESIA/SEDATION: Moderate (conscious) sedation was used. Three mg Versed, 150 mcg Fentanyl were administered intravenously. The patient's vital signs were  monitored continuously by radiology nursing throughout the procedure.  Sedation Time: 30 minutes  TECHNIQUE: Informed consent was obtained from the patient following explanation of the procedure, risks, benefits and alternatives. The patient understands, agrees and consents for the procedure. All questions were addressed. A time out was performed.  A planning axial CT scan was performed. The soft tissue nodule in the deep anatomic pelvis was successfully identified. A suitable left trans gluteal approach was identified. A skin entry site was selected and marked. The region was then sterilely prepped and draped in the standard fashion using Betadine skin prep.  Local anesthesia was attained by infiltration with 1% lidocaine. Using intermittent CT fluoroscopic guidance, a 17 gauge trocar needle was carefully advanced to the margin of the nodule. Unfortunately, the nodule was firm and freely mobile and difficult to appears. Multiple 18 gauge core biopsies were attempted using the BioPince automated biopsy device. Despite good positioning of the needle, the biopsy fragments returned were very min secondary to attempt in 1.3 cm throws. Therefore, the under trocar was repositioned multiple times in an effort to connective the wider portion of the soft tissue mass. This proved extremely challenging given the mobile nature of the mass.  Core specimens were placed in saline and delivered to pathology for further evaluation. Post biopsy axial CT imaging demonstrates minimal hemorrhage in the perirectal fat. There are some locules of air the adjacent to the biopsy site.  COMPLICATIONS: None immediate.  IMPRESSION: 1. Challenging, but ultimately technically successful biopsy of deep pelvic peritoneal mass. The mass was freely mobile and very challenging to target. 2. Due to multiple repositioned is required for needle placement and the close proximity of the biopsy site to the rectum, consider prophylactic antibiotics versus  close clinical monitoring as there is is at least a theoretical possibility of inadvertent rectal puncture. Findings and recommendations were discussed with Dr. Caryn Section by Dr. Laurence Ferrari at approximately noon on 04/19/2014.  Signed,  Criselda Peaches, MD  [facility]  [facility]   Electronically Signed   By: Jacqulynn Cadet M.D.   On: 04/19/2014 14:26   Dg Chest Port 1 View  04/21/2014   CLINICAL DATA:  Shortness of breath.  EXAM: PORTABLE CHEST - 1 VIEW  COMPARISON:  Chest radiograph 04/15/2014  FINDINGS: Stable cardiac and mediastinal contours. No consolidative pulmonary opacities. No pleural effusion or pneumothorax. Regional skeleton is unremarkable.  IMPRESSION: No acute cardiopulmonary process.   Electronically Signed   By: Lovey Newcomer M.D.   On: 04/21/2014 21:24    Microbiology: No results found for this or any previous visit (from the past 240 hour(s)).  Labs: Basic Metabolic Panel:  Recent Labs Lab 04/15/14 2133 04/16/14 0549 04/17/14 0545 04/21/14 0520  NA  --  138 141 139  K  --  4.3 3.9 4.1  CL  --  103 101 98  CO2  --  24 27 31   GLUCOSE  --  149* 135* 112*  BUN  --  10 8 17   CREATININE 0.87 0.83 0.74 0.78  CALCIUM  --  8.6 8.4 8.9   Liver Function Tests:  Recent Labs Lab 04/16/14 0549 04/17/14 0545 04/20/14 1105  AST 106* 73* 128*  ALT 144* 124* 196*  ALKPHOS 57 52 53  BILITOT 0.3 0.3 0.3  PROT 7.1 7.0 7.0  ALBUMIN 3.5 3.7 3.6   No results found for this basename: LIPASE, AMYLASE,  in the last 168 hours No results found for this basename: AMMONIA,  in the last 168 hours CBC:  Recent Labs Lab 04/16/14 0549 04/17/14 0545 04/18/14 0723 04/19/14 0559 04/21/14 0520  WBC 7.7 19.5* 19.2* 19.5* 20.5*  HGB 16.7 15.4 15.5 15.6 17.0  HCT 50.5 47.5 46.6 48.0 49.9  MCV 98.2 98.3 97.9 98.4 96.5  PLT 225 234 228 231 238   Cardiac Enzymes: No results found for this basename: CKTOTAL, CKMB, CKMBINDEX,  TROPONINI,  in the last 168 hours BNP: BNP (last 3 results) No results found for this basename: PROBNP,  in the last 8760 hours CBG:  Recent Labs Lab 04/21/14 1124 04/21/14 1657 04/21/14 2029 04/22/14 0752 04/22/14 1202  GLUCAP 103* 123* 117* 90 105*       Signed:  MEMON,JEHANZEB  [facility] 04/22/2014, 8:26 PM

## 2014-04-26 LAB — HEPATITIS C GENOTYPE

## 2014-04-27 NOTE — Op Note (Addendum)
[facility] [address] [place], [address]   ENDOSCOPY PROCEDURE REPORT  [name], Jeremy Mcgee  MR#: [id number] BIRTHDATE: [birth year] , 5  yrs. old GENDER: male  ENDOSCOPIST: Barney Drain REFERRED BY:  PROCEDURE DATE: 04/16/2014 PROCEDURE:   EGD w/ biopsy  INDICATIONS:METASTATIC DISEASE UNKNOWN PRIMARY-ABNORMAL GE JUNCTION ON CT.Marland Kitchen MEDICATIONS: Meperidine (Demerol) and Promethazine (Phenergan) 125 mg IV TOPICAL ANESTHETIC:   Viscous Xylocaine ASA CLASS:  DESCRIPTION OF PROCEDURE:     Physical exam was performed.  Informed consent was obtained from the patient after explaining the benefits, risks, and alternatives to the procedure.  The patient was connected to the monitor and placed in the left lateral position.  Continuous oxygen was provided by nasal cannula and IV medicine administered through an indwelling cannula.  After administration of sedation, the patients esophagus was intubated and the     endoscope was advanced under direct visualization to the second portion of the duodenum.  The scope was removed slowly by carefully examining the color, texture, anatomy, and integrity of the mucosa on the way out.  The patient was recovered in endoscopy and discharged home in satisfactory condition.   ESOPHAGUS: The mucosa of the esophagus appeared normal. STOMACH: A medium sized hiatal hernia was noted.   Moderate non-erosive gastritis (inflammation) was found in the entire examined stomach.  Multiple biopsies were performed using cold forceps. DUODENUM: The duodenal mucosa showed no abnormalities in the bulb and 2nd part of the duodenum. COMPLICATIONS: There were no immediate complications.  ENDOSCOPIC IMPRESSION: 1.   NO SOURCE FOR METASTATIC DISEASE IDENTIFIED 2.   Medium sized hiatal hernia 3.   MODERATE Non-erosive gastritis  RECOMMENDATIONS: AWAIT BIOPSY TCS SEP 20 BID PPI  REPEAT EXAM: _______________________________ eSignedBarney Drain, MD 05-02-2014 5:14 PMRevised: 05/02/2014 5:14 PM   CPT CODES: ICD CODES:  The ICD and CPT codes recommended by this software are interpretations from the data that the clinical staff has captured with the software.  The verification of the translation of this report to the ICD and CPT codes and modifiers is the sole responsibility of the health care institution and practicing physician where this report was generated.  [employer] will not be held responsible for the validity of the ICD and CPT codes included on this report.  AMA assumes no liability for data contained or not contained herein. CPT is a Designer, television/film set of the [employer].

## 2014-04-27 NOTE — Op Note (Signed)
[facility] [address] [place], [address]   COLONOSCOPY PROCEDURE REPORT  PATIENT: Jeremy, Mcgee  MR#: [id number] BIRTHDATE: 12-14-1965 , 62  yrs. old GENDER: male ENDOSCOPIST: Barney Drain, MD REFERRED BY: PROCEDURE DATE:  04/17/2014 PROCEDURE:   Colonoscopy, surveillance INDICATIONS:METASTAIC DISEASE PRIMARY UNKNOWN. MEDICATIONS: Meperidine (Demerol), Midazolam (Versed), and Promethazine (Phenergan)  DESCRIPTION OF PROCEDURE:    Physical exam was performed.  Informed consent was obtained from the patient after explaining the benefits, risks, and alternatives to procedure.  The patient was connected to monitor and placed in left lateral position. Continuous oxygen was provided by nasal cannula and IV medicine administered through an indwelling cannula.  After administration of sedation and rectal exam, the patients rectum was intubated and the     colonoscope was advanced under direct visualization to the cecum.  The scope was removed slowly by carefully examining the color, texture, anatomy, and integrity mucosa on the way out.  The patient was recovered in endoscopy and discharged home in satisfactory condition.    COLON FINDINGS: The colon was redundant.  Manual abdominal counter-pressure was used to reach the cecum.  The patient was moved on to their back to reach the cecum, Four sessile polyps were found in the descending colon and transverse colon.  , and Moderate sized internal hemorrhoids were found.  PREP QUALITY: good.  CECAL W/D TIME: > 10 MINS  COMPLICATIONS: None  ENDOSCOPIC IMPRESSION: 1.   The LEFT colon IS redundant 2.   Four COLON polyps IN THE DESCENDING colon and transverse colon 3.   Moderate sized internal hemorrhoids  RECOMMENDATIONS: ADVANCE DIET ONCOLOGY/IR CONSULT CHECK CEA      _______________________________ eSignedBarney Drain, MD 05/08/2014 5:09 PM    CPT CODES: ICD CODES:  The ICD and CPT codes  recommended by this software are interpretations from the data that the clinical staff has captured with the software.  The verification of the translation of this report to the ICD and CPT codes and modifiers is the sole responsibility of the health care institution and practicing physician where this report was generated.  [employer] will not be held responsible for the validity of the ICD and CPT codes included on this report.  AMA assumes no liability for data contained or not contained herein. CPT is a Designer, television/film set of the [employer].

## 2014-04-28 LAB — CA 125

## 2014-05-02 ENCOUNTER — Encounter: Payer: Self-pay | Admitting: Gastroenterology

## 2014-05-02 NOTE — Progress Notes (Signed)
Quick Note:  Hep C genotype 1a Let's arrange for outpatient follow-up with [place] in about 4-6 weeks. ______

## 2014-05-02 NOTE — Progress Notes (Signed)
APPT MADE, LETTER SENT °

## 2014-05-04 NOTE — Progress Notes (Signed)
Quick Note:  PT has OV on 06/06/2014 with Laban Emperor, NP. ______

## 2014-05-19 ENCOUNTER — Telehealth: Payer: Self-pay | Admitting: Gastroenterology

## 2014-05-19 NOTE — Telephone Encounter (Signed)
PT STARTED ON ON PYLERA SEP 25.

## 2014-06-06 ENCOUNTER — Ambulatory Visit (INDEPENDENT_AMBULATORY_CARE_PROVIDER_SITE_OTHER): Payer: Self-pay | Admitting: Gastroenterology

## 2014-06-06 VITALS — BP 127/89 | HR 77 | Temp 97.0°F | Ht 67.0 in | Wt 266.0 lb

## 2014-06-06 DIAGNOSIS — R894 Abnormal immunological findings in specimens from other organs, systems and tissues: Secondary | ICD-10-CM

## 2014-06-06 DIAGNOSIS — B9681 Helicobacter pylori [H. pylori] as the cause of diseases classified elsewhere: Secondary | ICD-10-CM

## 2014-06-06 DIAGNOSIS — D7389 Other diseases of spleen: Secondary | ICD-10-CM

## 2014-06-06 DIAGNOSIS — K635 Polyp of colon: Secondary | ICD-10-CM

## 2014-06-06 DIAGNOSIS — R768 Other specified abnormal immunological findings in serum: Secondary | ICD-10-CM

## 2014-06-06 DIAGNOSIS — K2970 Gastritis, unspecified, without bleeding: Secondary | ICD-10-CM

## 2014-06-06 DIAGNOSIS — D739 Disease of spleen, unspecified: Secondary | ICD-10-CM

## 2014-06-07 ENCOUNTER — Encounter: Payer: Self-pay | Admitting: Gastroenterology

## 2014-06-07 ENCOUNTER — Telehealth: Payer: Self-pay | Admitting: Gastroenterology

## 2014-06-07 NOTE — Telephone Encounter (Signed)
I called pt and told him the nebulizer showed on his med list, we just did not have access to the computer yesterday because of the power outage.

## 2014-06-07 NOTE — Telephone Encounter (Signed)
Pt was seen yesterday by [facility] as a new patient. He called today to let the nurse know that he forgot to tell her that he takes 4-6 albuterol tx a day for Chronic COPD

## 2014-06-13 ENCOUNTER — Other Ambulatory Visit: Payer: Self-pay

## 2014-06-13 ENCOUNTER — Encounter: Payer: Self-pay | Admitting: Gastroenterology

## 2014-06-13 ENCOUNTER — Telehealth: Payer: Self-pay | Admitting: Gastroenterology

## 2014-06-13 DIAGNOSIS — K2970 Gastritis, unspecified, without bleeding: Secondary | ICD-10-CM

## 2014-06-13 DIAGNOSIS — R7989 Other specified abnormal findings of blood chemistry: Secondary | ICD-10-CM

## 2014-06-13 DIAGNOSIS — R945 Abnormal results of liver function studies: Principal | ICD-10-CM

## 2014-06-13 DIAGNOSIS — D7389 Other diseases of spleen: Secondary | ICD-10-CM | POA: Insufficient documentation

## 2014-06-13 DIAGNOSIS — B9681 Helicobacter pylori [H. pylori] as the cause of diseases classified elsewhere: Secondary | ICD-10-CM | POA: Insufficient documentation

## 2014-06-13 NOTE — Telephone Encounter (Signed)
Wait until he has insurance.

## 2014-06-13 NOTE — Assessment & Plan Note (Signed)
Not manipulated on colonoscopy. Early interval colonoscopy indicated, consider in next year.

## 2014-06-13 NOTE — Telephone Encounter (Signed)
Ginger: note is done. Just needs referral to Dr. Arnoldo Morale.  Doris: Can you have repeat LFTs ordered? Thanks! Also, need to find out the status of cone assistance.  Once he has insurance, we can submit for Harvoni.

## 2014-06-13 NOTE — Telephone Encounter (Signed)
Noted  

## 2014-06-13 NOTE — Progress Notes (Signed)
Referring Provider: No ref. provider found Primary Care Physician:  No PCP Per Patient  Primary GI: Dr. Oneida Alar   Chief Complaint  Patient presents with  . Establish Care    HPI:   Jeremy Mcgee is a 48 year old male returning in hospital follow-up after admission for persistent abdominal pain. CT revealed peritoneal/omental disease concerning for malignancy. TCS/EGD performed with medium-sized hiatal hernia, moderate non-erosive gastritis, +H.pylori, colonoscopy with moderate sized internal hemorrhoids and four sessile polyps in colon, not manipulated. Prescribed Pylera 9/25. Oncology consultation while inpatient, with IR arranged to biopsy one of the omental masses. 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 No evidence of malignancy. General surgery consultation recommended for resection in an elective setting. Continues to report abdominal pain at navel and lower abdomen, intermittent but improved. Taking 5-10 aspirins daily. Ran out of pain medication. Medicaid pending. Notes historically would drink ETOH but none currently. Feels weak in his stomach in the morning, vomiting yesterday.   Also notable Hep C antibody positive with +RNA. Genotype 1a. Would like to pursue treatment. Cirrhosis per CT imaging.   Past Medical History  Diagnosis Date  . Hypertension   . Coronary artery disease   . Asthma   . COPD (chronic obstructive pulmonary disease)     emphysema  . Abdominal trauma   . 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  . Colon polyps 04/17/2014  . Gastritis 04/17/2014  . Hiatal hernia 04/17/2014    Could have been possible "gastric mass" on CT  . Alcoholic cirrhosis of liver without ascites 04/18/2014  . Hepatitis C antibody test positive 04/18/2014    Past Surgical History  Procedure Laterality Date  . Splenectomy  1974  . Tonsillectomy    . Esophagogastroduodenoscopy N/A 04/16/2014    Dr. Wilhelmina Mcardle SOURCE FOR  METASTATIC DISEASE IDENTIFIED/Medium sized hiatal hernia/MODERATE Non-erosive gastritis. H. PYLORI. Treated with Pylera  . Colonoscopy N/A 04/17/2014    Dr. Oneida Alar:The LEFT colon IS redundant/Four COLON polyps IN THE DESCENDING colon and transverse colon  Moderate sized internal hemorrhoids. Polyps not manipulated    Current Outpatient Prescriptions  Medication Sig Dispense Refill  . albuterol (PROVENTIL) (2.5 MG/3ML) 0.083% nebulizer solution Take 3 mLs (2.5 mg total) by nebulization every 6 (six) hours as needed for wheezing or shortness of breath. 75 mL 12  . aspirin EC 325 MG tablet Take 325 mg by mouth daily.    Marland Kitchen atenolol (TENORMIN) 50 MG tablet Take 50 mg by mouth daily.    . Cyanocobalamin (B-12 PO) Take 1 tablet by mouth daily.    Marland Kitchen lisinopril (PRINIVIL,ZESTRIL) 5 MG tablet Take 1 tablet (5 mg total) by mouth daily. 30 tablet 1  . Melatonin 5 MG TABS Take by mouth.    . morphine (MS CONTIN) 30 MG 12 hr tablet Take 1 tablet (30 mg total) by mouth every 12 (twelve) hours. 30 tablet 0  . Multiple Vitamin (MULTIVITAMIN WITH MINERALS) TABS tablet Take 1 tablet by mouth daily.    Marland Kitchen oxyCODONE (OXY IR/ROXICODONE) 5 MG immediate release tablet Take 1 tablet (5 mg total) by mouth every 6 (six) hours as needed for severe pain. 30 tablet 0  . pantoprazole (PROTONIX) 40 MG tablet Take 1 tablet (40 mg total) by mouth 2 (two) times daily before a meal. 60 tablet 1   No current facility-administered medications for this visit.    Allergies as of 06/06/2014 - Review Complete 05/19/2014  Allergen Reaction  Noted  . Penicillins Hives 04/15/2014    Family History  Problem Relation Age of Onset  . Cancer Maternal Grandmother     lung  . Leukemia Maternal Uncle   . Colon cancer Brother     Familial polyposis?  . Stomach cancer Maternal Aunt   . Hypertension Maternal Grandfather   . Heart attack Brother 44  . Stroke Brother 87    History   Social History  . Marital Status: Single    Spouse  Name: N/A    Number of Children: N/A  . Years of Education: N/A   Social History Main Topics  . Smoking status: Current Every Day Smoker -- 1.00 packs/day    Types: Cigarettes  . Smokeless tobacco: None  . Alcohol Use: No  . Drug Use: No  . Sexual Activity: None   Other Topics Concern  . None   Social History Narrative    Review of Systems: As mentioned in HPI  Physical Exam: BP 127/89 mmHg  Pulse 77  Temp(Src) 97 F (36.1 C) (Oral)  Ht 5\' 7"  (1.702 m)  Wt 266 lb (120.657 kg)  BMI 41.65 kg/m2 General:   Alert and oriented. No distress noted. Pleasant and cooperative.  Head:  Normocephalic and atraumatic. Eyes:  Conjuctiva clear without scleral icterus. Mouth:  Oral mucosa pink and moist. Good dentition. No lesions. Abdomen:  +BS, soft, obese, TTP lower abdomen Msk:  Symmetrical without gross deformities. Normal posture. Extremities:  Without edema. Neurologic:  Alert and  oriented x4;  grossly normal neurologically. Psych:  Alert and cooperative. Normal mood and affect.  Lab Results  Component Value Date   ALT 196* 04/20/2014   AST 128* 04/20/2014   ALKPHOS 53 04/20/2014   BILITOT 0.3 04/20/2014   Lab Results  Component Value Date   CREATININE 0.78 04/21/2014   BUN 17 04/21/2014   NA 139 04/21/2014   K 4.1 04/21/2014   CL 98 04/21/2014   CO2 31 04/21/2014   Lab Results  Component Value Date   INR 1.03 04/18/2014

## 2014-06-13 NOTE — Assessment & Plan Note (Signed)
Verified by biopsy. Symptomatic. 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 Question component of chronic abdominal pain as well.

## 2014-06-13 NOTE — Telephone Encounter (Signed)
Pt called back. He has not heard anything from [place] assistance. He will not be able to go to the surgeon in [place] due to transportation. Please advise what you would like for me do about the surgeon. Does he need the labs now or wait until he has insurance

## 2014-06-13 NOTE — Assessment & Plan Note (Signed)
With positive RNA, genotype 1a. Cirrhosis per imaging. No prior treatment, needs Hep A/B vaccinations. Elevated transaminases; will recheck now. Consider autoimmune/PBC serologies if persistently elevated transaminases although likely due to chronic Hep C.   Obtain insurance through either Medicaid or [employer], then will proceed with submission for Harvoni X 12 weeks.

## 2014-06-13 NOTE — Patient Instructions (Signed)
AVS provided handwritten as computers were down.

## 2014-06-13 NOTE — Telephone Encounter (Signed)
Left message for pt to call. Lab order has been faxed to [facility].

## 2014-06-13 NOTE — Assessment & Plan Note (Addendum)
Treatment with Pylera. Needs breath test at some point in future. Mild nausea in mornings, on Protonix BID. Stop Protoxin and start Dexilant once daily.

## 2014-06-14 ENCOUNTER — Encounter: Payer: Self-pay | Admitting: Oncology

## 2014-06-16 NOTE — Progress Notes (Signed)
No pcp per patient 

## 2014-06-19 ENCOUNTER — Encounter (HOSPITAL_COMMUNITY): Payer: Self-pay | Admitting: Emergency Medicine

## 2014-06-19 ENCOUNTER — Emergency Department (HOSPITAL_COMMUNITY)
Admission: EM | Admit: 2014-06-19 | Discharge: 2014-06-19 | Disposition: A | Discharge status: Home / Self Care | Payer: Self-pay | Attending: Emergency Medicine | Admitting: Emergency Medicine

## 2014-06-19 ENCOUNTER — Emergency Department (HOSPITAL_COMMUNITY): Payer: Self-pay

## 2014-06-19 DIAGNOSIS — Z79899 Other long term (current) drug therapy: Secondary | ICD-10-CM | POA: Insufficient documentation

## 2014-06-19 DIAGNOSIS — Y998 Other external cause status: Secondary | ICD-10-CM | POA: Insufficient documentation

## 2014-06-19 DIAGNOSIS — I10 Essential (primary) hypertension: Secondary | ICD-10-CM | POA: Insufficient documentation

## 2014-06-19 DIAGNOSIS — Z8719 Personal history of other diseases of the digestive system: Secondary | ICD-10-CM | POA: Insufficient documentation

## 2014-06-19 DIAGNOSIS — Z7982 Long term (current) use of aspirin: Secondary | ICD-10-CM | POA: Insufficient documentation

## 2014-06-19 DIAGNOSIS — J449 Chronic obstructive pulmonary disease, unspecified: Secondary | ICD-10-CM | POA: Insufficient documentation

## 2014-06-19 DIAGNOSIS — Y929 Unspecified place or not applicable: Secondary | ICD-10-CM | POA: Insufficient documentation

## 2014-06-19 DIAGNOSIS — I2510 Atherosclerotic heart disease of native coronary artery without angina pectoris: Secondary | ICD-10-CM | POA: Insufficient documentation

## 2014-06-19 DIAGNOSIS — Z720 Tobacco use: Secondary | ICD-10-CM | POA: Insufficient documentation

## 2014-06-19 DIAGNOSIS — Y9389 Activity, other specified: Secondary | ICD-10-CM | POA: Insufficient documentation

## 2014-06-19 DIAGNOSIS — S30860A Insect bite (nonvenomous) of lower back and pelvis, initial encounter: Secondary | ICD-10-CM | POA: Insufficient documentation

## 2014-06-19 DIAGNOSIS — Z87828 Personal history of other (healed) physical injury and trauma: Secondary | ICD-10-CM | POA: Insufficient documentation

## 2014-06-19 DIAGNOSIS — T782XXA Anaphylactic shock, unspecified, initial encounter: Secondary | ICD-10-CM | POA: Insufficient documentation

## 2014-06-19 DIAGNOSIS — Z880 Allergy status to penicillin: Secondary | ICD-10-CM | POA: Insufficient documentation

## 2014-06-19 DIAGNOSIS — W57XXXA Bitten or stung by nonvenomous insect and other nonvenomous arthropods, initial encounter: Secondary | ICD-10-CM | POA: Insufficient documentation

## 2014-06-19 DIAGNOSIS — Z86010 Personal history of colonic polyps: Secondary | ICD-10-CM | POA: Insufficient documentation

## 2014-06-19 DIAGNOSIS — Z8619 Personal history of other infectious and parasitic diseases: Secondary | ICD-10-CM | POA: Insufficient documentation

## 2014-06-19 DIAGNOSIS — R0600 Dyspnea, unspecified: Secondary | ICD-10-CM | POA: Insufficient documentation

## 2014-06-19 DIAGNOSIS — F419 Anxiety disorder, unspecified: Secondary | ICD-10-CM | POA: Insufficient documentation

## 2014-06-19 LAB — CBC WITH DIFFERENTIAL/PLATELET
BASOS PCT: 1 % (ref 0–1)
Basophils Absolute: 0.1 10*3/uL (ref 0.0–0.1)
EOS ABS: 0.3 10*3/uL (ref 0.0–0.7)
Eosinophils Relative: 2 % (ref 0–5)
HCT: 49.6 % (ref 39.0–52.0)
HEMOGLOBIN: 17.3 g/dL — AB (ref 13.0–17.0)
LYMPHS ABS: 6.3 10*3/uL — AB (ref 0.7–4.0)
Lymphocytes Relative: 39 % (ref 12–46)
MCH: 32.5 pg (ref 26.0–34.0)
MCHC: 34.9 g/dL (ref 30.0–36.0)
MCV: 93.2 fL (ref 78.0–100.0)
MONOS PCT: 7 % (ref 3–12)
Monocytes Absolute: 1.2 10*3/uL — ABNORMAL HIGH (ref 0.1–1.0)
NEUTROS ABS: 8.3 10*3/uL — AB (ref 1.7–7.7)
NEUTROS PCT: 52 % (ref 43–77)
PLATELETS: 235 10*3/uL (ref 150–400)
RBC: 5.32 MIL/uL (ref 4.22–5.81)
RDW: 14.7 % (ref 11.5–15.5)
WBC: 16.1 10*3/uL — ABNORMAL HIGH (ref 4.0–10.5)

## 2014-06-19 LAB — COMPREHENSIVE METABOLIC PANEL
ALBUMIN: 3.9 g/dL (ref 3.5–5.2)
ALT: 131 U/L — AB (ref 0–53)
AST: 105 U/L — ABNORMAL HIGH (ref 0–37)
Alkaline Phosphatase: 68 U/L (ref 39–117)
Anion gap: 17 — ABNORMAL HIGH (ref 5–15)
BUN: 11 mg/dL (ref 6–23)
CO2: 23 mEq/L (ref 19–32)
Calcium: 9.7 mg/dL (ref 8.4–10.5)
Chloride: 96 mEq/L (ref 96–112)
Creatinine, Ser: 0.87 mg/dL (ref 0.50–1.35)
GFR calc Af Amer: >90 mL/min (ref >90)
GFR calc non Af Amer: >90 mL/min (ref >90)
Glucose, Bld: 101 mg/dL — ABNORMAL HIGH (ref 70–99)
Potassium: 4.1 mEq/L (ref 3.7–5.3)
SODIUM: 136 meq/L — AB (ref 137–147)
TOTAL PROTEIN: 7.4 g/dL (ref 6.0–8.3)

## 2014-06-19 MED ORDER — FAMOTIDINE IN NACL 20-0.9 MG/50ML-% IV SOLN
20.0000 mg | Freq: Once | INTRAVENOUS | Status: AC
  Administered 2014-06-19: 20 mg via INTRAVENOUS
  Filled 2014-06-19: qty 50

## 2014-06-19 MED ORDER — ONDANSETRON 8 MG PO TBDP: 8.0000 mg | ORAL_TABLET | Freq: Once | ORAL | Status: DC

## 2014-06-19 MED ORDER — METHYLPREDNISOLONE SODIUM SUCC 125 MG IJ SOLR
125.0000 mg | Freq: Once | INTRAMUSCULAR | Status: AC
  Administered 2014-06-19: 125 mg via INTRAVENOUS
  Filled 2014-06-19: qty 2

## 2014-06-19 MED ORDER — DIPHENHYDRAMINE HCL 50 MG/ML IJ SOLN
25.0000 mg | Freq: Once | INTRAMUSCULAR | Status: AC
  Administered 2014-06-19: 25 mg via INTRAVENOUS
  Filled 2014-06-19: qty 1

## 2014-06-19 MED ORDER — ONDANSETRON HCL 4 MG/2ML IJ SOLN
4.0000 mg | Freq: Once | INTRAMUSCULAR | Status: AC
  Administered 2014-06-19: 4 mg via INTRAVENOUS
  Filled 2014-06-19: qty 2

## 2014-06-19 MED ORDER — EPINEPHRINE 0.3 MG/0.3ML IJ SOAJ
0.3000 mg | Freq: Once | INTRAMUSCULAR | Status: AC
  Administered 2014-06-19: 0.3 mg via INTRAMUSCULAR
  Filled 2014-06-19: qty 0.3

## 2014-06-19 MED ORDER — EPINEPHRINE 0.3 MG/0.3ML IJ SOAJ: 0.3000 mg | Freq: Once | INTRAMUSCULAR | Status: DC

## 2014-06-19 MED ORDER — MORPHINE SULFATE 2 MG/ML IJ SOLN
2.0000 mg | Freq: Once | INTRAMUSCULAR | Status: AC
  Administered 2014-06-19: 2 mg via INTRAVENOUS
  Filled 2014-06-19: qty 1

## 2014-06-19 MED ORDER — ALBUTEROL SULFATE HFA 108 (90 BASE) MCG/ACT IN AERS: 1.0000 | INHALATION_SPRAY | Freq: Four times a day (QID) | RESPIRATORY_TRACT | Status: DC | PRN

## 2014-06-19 MED ORDER — DIPHENHYDRAMINE HCL 25 MG PO TABS: 50.0000 mg | ORAL_TABLET | ORAL | Status: DC | PRN

## 2014-06-19 MED ORDER — PREDNISONE 50 MG PO TABS: ORAL_TABLET | ORAL | Status: DC

## 2014-06-19 MED ORDER — ALBUTEROL (5 MG/ML) CONTINUOUS INHALATION SOLN
10.0000 mg/h | INHALATION_SOLUTION | Freq: Once | RESPIRATORY_TRACT | Status: AC
  Administered 2014-06-19: 10 mg/h via RESPIRATORY_TRACT
  Filled 2014-06-19: qty 20

## 2014-06-19 NOTE — ED Notes (Signed)
Pt reports he spent the night in a place with bed bugs. Reports bites "all over and not sure if I am having an allergic reaction". Reports emesis and generalized body aches.

## 2014-06-19 NOTE — ED Notes (Signed)
Called MD to bedside, pt sob, tight in chest, multiple bug bites, itchy rash. Pt vomiting

## 2014-06-19 NOTE — ED Provider Notes (Signed)
CSN: [id number]     Arrival date & time 06/19/14  0240 History   First MD Initiated Contact with Patient 06/19/14 0253     Chief Complaint  Patient presents with  . Insect Bite  . Emesis      Patient is a 48 y.o. male presenting with allergic reaction. The history is provided by the patient.  Allergic Reaction Presenting symptoms: difficulty breathing, difficulty swallowing, itching, rash and wheezing   Difficulty breathing:    Severity:  Moderate   Onset quality:  Gradual   Timing:  Constant   Progression:  Worsening Rash:    Location:  Back and abdomen Severity:  Severe Prior allergic episodes:  Insect allergies Relieved by:  Nothing Worsened by:  Nothing tried Ineffective treatments:  Antihistamines Patient reports he woke up approximately 5 hours ago itching with rash to body Since that time he feels SOB. He also feels dizziness.  He feels his tongue is swelling No syncope No diarrhea but he does have vomiting He reports since the reaction started he feels SOB and feels his chest is tight.  He thinks this is due to bed bugs.  He has not had any type of reaction like this recently.    PMH - Hypertension Soc history - quit smoking one week ago Past Medical History  Diagnosis Date  . Hypertension   . Coronary artery disease   . Asthma   . COPD (chronic obstructive pulmonary disease)     emphysema  . Abdominal trauma   . 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  . Colon polyps 04/17/2014  . Gastritis 04/17/2014  . Hiatal hernia 04/17/2014    Could have been possible "gastric mass" on CT  . Alcoholic cirrhosis of liver without ascites 04/18/2014  . Hepatitis C antibody test positive 04/18/2014   Past Surgical History  Procedure Laterality Date  . Splenectomy  1974  . Tonsillectomy    . Esophagogastroduodenoscopy N/A 04/16/2014    Dr. Wilhelmina Mcardle SOURCE FOR METASTATIC DISEASE IDENTIFIED/Medium sized hiatal hernia/MODERATE Non-erosive gastritis. H.  PYLORI. Treated with Pylera  . Colonoscopy N/A 04/17/2014    Dr. Oneida Alar:The LEFT colon IS redundant/Four COLON polyps IN THE DESCENDING colon and transverse colon  Moderate sized internal hemorrhoids. Polyps not manipulated   Family History  Problem Relation Age of Onset  . Cancer Maternal Grandmother     lung  . Leukemia Maternal Uncle   . Colon cancer Brother     Familial polyposis?  . Stomach cancer Maternal Aunt   . Hypertension Maternal Grandfather   . Heart attack Brother 84  . Stroke Brother 27   History  Substance Use Topics  . Smoking status: Current Every Day Smoker -- 1.00 packs/day    Types: Cigarettes  . Smokeless tobacco: Not on file  . Alcohol Use: No    Review of Systems  HENT: Positive for trouble swallowing.   Respiratory: Positive for wheezing.   Gastrointestinal: Positive for vomiting.  Skin: Positive for itching and rash.  Neurological: Positive for dizziness.  All other systems reviewed and are negative.     Allergies  Penicillins  Home Medications   Prior to Admission medications   Medication Sig Start Date End Date Taking? Authorizing Provider  albuterol (PROVENTIL) (2.5 MG/3ML) 0.083% nebulizer solution Take 3 mLs (2.5 mg total) by nebulization every 6 (six) hours as needed for wheezing or shortness of breath. 04/22/14   Kathie Dike, MD  aspirin EC 325 MG tablet Take  325 mg by mouth daily.    Historical Provider, MD  atenolol (TENORMIN) 50 MG tablet Take 50 mg by mouth daily.    Historical Provider, MD  Cyanocobalamin (B-12 PO) Take 1 tablet by mouth daily.    Historical Provider, MD  lisinopril (PRINIVIL,ZESTRIL) 5 MG tablet Take 1 tablet (5 mg total) by mouth daily. 04/22/14   Kathie Dike, MD  Melatonin 5 MG TABS Take by mouth.    Historical Provider, MD  morphine (MS CONTIN) 30 MG 12 hr tablet Take 1 tablet (30 mg total) by mouth every 12 (twelve) hours. 04/22/14   Kathie Dike, MD  Multiple Vitamin (MULTIVITAMIN WITH MINERALS) TABS  tablet Take 1 tablet by mouth daily.    Historical Provider, MD  oxyCODONE (OXY IR/ROXICODONE) 5 MG immediate release tablet Take 1 tablet (5 mg total) by mouth every 6 (six) hours as needed for severe pain. 04/22/14   Kathie Dike, MD  pantoprazole (PROTONIX) 40 MG tablet Take 1 tablet (40 mg total) by mouth 2 (two) times daily before a meal. 04/22/14   Kathie Dike, MD   BP 153/111 mmHg  Pulse 105  Temp(Src) 98.3 F (36.8 C) (Oral)  Resp 24  Ht 5\' 7"  (1.702 m)  Wt 280 lb (127.007 kg)  BMI 43.84 kg/m2  SpO2 100% Physical Exam CONSTITUTIONAL: anxious, ill appearing.  Pt is actively vomiting HEAD: Normocephalic/atraumatic EYES: EOMI/PERRL ENMT: Mucous membranes moist. No lip swelling.  Mild tongue edema noted.  Uvula midline.  No edema noted to posterior oropharynx.  No stridor.  Normal phonation NECK: supple no meningeal signs SPINE/BACK:entire spine nontender CV: S1/S2 noted, no murmurs/rubs/gallops noted LUNGS: wheezing bilaterally ABDOMEN: soft, nontender, no rebound or guarding, bowel sounds noted throughout abdomen GU:no cva tenderness NEURO: Pt is awake/alert/appropriate, moves all extremitiesx4.  No facial droop.   EXTREMITIES: pulses normal/equal, full ROM SKIN: hives noted to abdomen/back PSYCH: anxious, alert and oriented to situation  ED Course  Procedures   CRITICAL CARE Performed by: Sharyon Cable Total critical care time: 35 Critical care time was exclusive of separately billable procedures and treating other patients. Critical care was necessary to treat or prevent imminent or life-threatening deterioration. Critical care was time spent personally by me on the following activities: development of treatment plan with patient and/or surrogate as well as nursing, discussions with consultants, evaluation of patient's response to treatment, examination of patient, obtaining history from patient or surrogate, ordering and performing treatments and interventions,  ordering and review of laboratory studies, ordering and review of radiographic studies, pulse oximetry and re-evaluation of patient's condition. PATIENT GIVEN EPIPNEPHRINE IM, ALBUTEROL NEBS, SOLUMEDROL/PEPCID/BENADRYL FOR SEVERE ALLERGIC REACTION  Labs Review Labs Reviewed  CBC WITH DIFFERENTIAL - Abnormal; Notable for the following:    WBC 16.1 (*)    Hemoglobin 17.3 (*)    Neutro Abs 8.3 (*)    Lymphs Abs 6.3 (*)    Monocytes Absolute 1.2 (*)    All other components within normal limits  COMPREHENSIVE METABOLIC PANEL - Abnormal; Notable for the following:    Sodium 136 (*)    Glucose, Bld 101 (*)    AST 105 (*)    ALT 131 (*)    Total Bilirubin <0.2 (*)    Anion gap 17 (*)    All other components within normal limits    Imaging Review Dg Chest Portable 1 View  06/19/2014   CLINICAL DATA:  Shortness of breath. Chest pain. Pressure starting 6 hr ago. Multiple bites from bed buttocks. Possible allergic  reaction.  EXAM: PORTABLE CHEST - 1 VIEW  COMPARISON:  04/21/2014  FINDINGS: The heart size and mediastinal contours are within normal limits. Both lungs are clear. The visualized skeletal structures are unremarkable.  IMPRESSION: No active disease.   Electronically Signed   By: Lucienne Capers M.D.   On: 06/19/2014 03:59     EKG Interpretation   Date/Time:  Sunday June 19 2014 03:14:02 EST Ventricular Rate:  104 PR Interval:  127 QRS Duration: 81 QT Interval:  335 QTC Calculation: 441 R Axis:   51 Text Interpretation:  Sinus tachycardia ECG OTHERWISE WITHIN NORMAL LIMITS  No significant change since Confirmed by Christy Gentles  MD, Berlyn Saylor ([id number]) on  06/19/2014 3:20:34 AM     Medications  EPINEPHrine (EPI-PEN) injection 0.3 mg (0.3 mg Intramuscular Given 06/19/14 0314)  diphenhydrAMINE (BENADRYL) injection 25 mg (25 mg Intravenous Given 06/19/14 0315)  methylPREDNISolone sodium succinate (SOLU-MEDROL) 125 mg/2 mL injection 125 mg (125 mg Intravenous Given 06/19/14 0313)   albuterol (PROVENTIL,VENTOLIN) solution continuous neb (10 mg/hr Nebulization Given 06/19/14 0316)  famotidine (PEPCID) IVPB 20 mg (0 mg Intravenous Stopped 06/19/14 0347)  ondansetron (ZOFRAN) injection 4 mg (4 mg Intravenous Given 06/19/14 0318)  ondansetron (ZOFRAN) injection 4 mg (4 mg Intravenous Given 06/19/14 0414)  morphine 2 MG/ML injection 2 mg (2 mg Intravenous Given 06/19/14 0442)    MDM  3:26 AM PT SEEN/EXAMINED.  I AM CONCERNED FOR ANAPHYLAXIS AS PATIENT WITH INSECT BITE NOW HAVING HIVES/WHEEZING/VOMITING/DIZZINESS.  HE ALSO REPORTS FEELING HIS TONGUE IS SWOLLEN EPIPEN ORDERED.  HE DID REPORT CHEST TIGHTNESS ALONG WITH WHEEZING - EKG DOES NOT REVEAL STEMI AND NO ISCHEMIC CHANGES.  I FEEL BENEFIT OF EPIPEN OUTWEIGHS RISK AS THIS APPEARS TO BE ANAPHYLAXIS 3:46 AM PATIENT FEELS IMPROVED AFTER MEDICATION HE IS RESTING COMFORTABLY AT THIS TIME 6:04 AM Pt improved He still has some wheeze but improved and suspect this may be related more to his COPD His rash is still present but appears comfortable He reports his tongue feels improved and no significant angioedema noted Plan to observe for another hour and if continues to improve will d/c home  7:10 AM Pt improved and would like to go home We discussed appropriate use of epipen and strict return precautions  Final diagnoses:  Dyspnea  Anaphylactic reaction, initial encounter    Nursing notes including past medical history and social history reviewed and considered in documentation xrays/imaging reviewed by myself and considered during evaluation Labs/vital reviewed myself and considered during evaluation Previous records reviewed and considered     Sharyon Cable, MD 06/19/14 [phone]

## 2014-06-19 NOTE — ED Notes (Signed)
Pt continues to get breathing treatment

## 2014-06-19 NOTE — ED Notes (Signed)
Pt complaining of abdominal pain, MD called to bedside, orders given

## 2014-06-19 NOTE — Discharge Instructions (Signed)
Anaphylactic Reaction °An anaphylactic reaction is a sudden, severe allergic reaction that involves the whole body. It can be life threatening. A hospital stay is often required. People with asthma, eczema, or hay fever are slightly more likely to have an anaphylactic reaction. °CAUSES  °An anaphylactic reaction may be caused by anything to which you are allergic. After being exposed to the allergic substance, your immune system becomes sensitized to it. When you are exposed to that allergic substance again, an allergic reaction can occur. Common causes of an anaphylactic reaction include: °· Medicines. °· Foods, especially peanuts, wheat, shellfish, milk, and eggs. °· Insect bites or stings. °· Blood products. °· Chemicals, such as dyes, latex, and contrast material used for imaging tests. °SYMPTOMS  °When an allergic reaction occurs, the body releases histamine and other substances. These substances cause symptoms such as tightening of the airway. Symptoms often develop within seconds or minutes of exposure. Symptoms may include: °· Skin rash or hives. °· Itching. °· Chest tightness. °· Swelling of the eyes, tongue, or lips. °· Trouble breathing or swallowing. °· Lightheadedness or fainting. °· Anxiety or confusion. °· Stomach pains, vomiting, or diarrhea. °· Nasal congestion. °· A fast or irregular heartbeat (palpitations). °DIAGNOSIS  °Diagnosis is based on your history of recent exposure to allergic substances, your symptoms, and a physical exam. Your caregiver may also perform blood or urine tests to confirm the diagnosis. °TREATMENT  °Epinephrine medicine is the main treatment for an anaphylactic reaction. Other medicines that may be used for treatment include antihistamines, steroids, and albuterol. In severe cases, fluids and medicine to support blood pressure may be given through an intravenous line (IV). Even if you improve after treatment, you need to be observed to make sure your condition does not get  worse. This may require a stay in the hospital. °HOME CARE INSTRUCTIONS  °· Wear a medical alert bracelet or necklace stating your allergy. °· You and your family must learn how to use an anaphylaxis kit or give an epinephrine injection to temporarily treat an emergency allergic reaction. Always carry your epinephrine injection or anaphylaxis kit with you. This can be lifesaving if you have a severe reaction. °· Do not drive or perform tasks after treatment until the medicines used to treat your reaction have worn off, or until your caregiver says it is okay. °· If you have hives or a rash: °¨ Take medicines as directed by your caregiver. °¨ You may use an over-the-counter antihistamine (diphenhydramine) as needed. °¨ Apply cold compresses to the skin or take baths in cool water. Avoid hot baths or showers. °SEEK MEDICAL CARE IF:  °· You develop symptoms of an allergic reaction to a new substance. Symptoms may start right away or minutes later. °· You develop a rash, hives, or itching. °· You develop new symptoms. °SEEK IMMEDIATE MEDICAL CARE IF:  °· You have swelling of the mouth, difficulty breathing, or wheezing. °· You have a tight feeling in your chest or throat. °· You develop hives, swelling, or itching all over your body. °· You develop severe vomiting or diarrhea. °· You feel faint or pass out. °This is an emergency. Use your epinephrine injection or anaphylaxis kit as you have been instructed. Call your local emergency services (911 in [place] Even if you improve after the injection, you need to be examined at a hospital emergency department. °MAKE SURE YOU:  °· Understand these instructions. °· Will watch your condition. °· Will get help right away if you are not   doing well or get worse. Document Released: 07/15/2005 Document Revised: 07/20/2013 Document Reviewed: 10/16/2011 [facility] Patient Information 2015 [place], [place]. This information is not intended to replace advice given to you by your health  care provider. Make sure you discuss any questions you have with your health care provider.     [facility] Dept. [address]         [place] Phone:  [phone]                                  Phone:  [phone]                   Phone:  [phone]  [place], [place] in [place], [address],                                  [place] [phone] or [phone] (After Hours)   [place]  Substance Abuse Resources: - Alcohol and Drug Services  [phone] - [place] [phone] - [facility] [place] [phone] - Residential & Outpatient Substance Abuse Program  [phone]  Psychological Services: - [place]  [place]  [place], [phone] [place]. [address] [place], [place]: [phone] or [phone], [email or web address]  -  - [place] Department- [place] Department- [place] Department- [phone]

## 2014-06-22 ENCOUNTER — Telehealth: Payer: Self-pay | Admitting: Gastroenterology

## 2014-06-22 NOTE — Telephone Encounter (Signed)
Per the patient he will get labs drawn on next week.  Lab order faxed to [facility] lab

## 2014-06-22 NOTE — Telephone Encounter (Signed)
Patient has been approved at 100% for 06/07/14-12/06/14

## 2014-06-22 NOTE — Telephone Encounter (Signed)
Pt called today to give me his new address and phone number to upset in the system and then asked about his [place] assistance being approved and when should he get his labs done. I told him that I would send a note to the nurse to see about the labs and check with CM about the assistance papers. Please advise and call him at [phone]

## 2014-06-22 NOTE — Telephone Encounter (Signed)
Patient is aware of [facility] approval and that he is responsible for the bills prior to his approval.

## 2014-06-29 ENCOUNTER — Telehealth: Payer: Self-pay | Admitting: Gastroenterology

## 2014-06-29 LAB — HEPATIC FUNCTION PANEL
ALBUMIN: 3.7 g/dL (ref 3.5–5.2)
ALK PHOS: 49 U/L (ref 39–117)
ALT: 149 U/L — ABNORMAL HIGH (ref 0–53)
AST: 114 U/L — ABNORMAL HIGH (ref 0–37)
Bilirubin, Direct: 0.1 mg/dL (ref 0.0–0.3)
Indirect Bilirubin: 0.2 mg/dL (ref 0.2–1.2)
TOTAL PROTEIN: 6.4 g/dL (ref 6.0–8.3)
Total Bilirubin: 0.3 mg/dL (ref 0.2–1.2)

## 2014-06-29 NOTE — Telephone Encounter (Signed)
Patient has called back again. He is going to the hospital to sit with his sick Aunt and asked for [place] to call him there. [phone]

## 2014-06-29 NOTE — Telephone Encounter (Signed)
L/M to call.

## 2014-06-29 NOTE — Telephone Encounter (Signed)
PATIENT WANTING TO KNOW IF YOU CAN WRITE HIM ANOTHER PRESCRIPTION FOR THE PAIN MEDS HE WAS GIVEN ON HIS LAST VISIT.  PLEASE ADVISE [phone]

## 2014-06-29 NOTE — Telephone Encounter (Signed)
I informed pt. He does not have a pcp.

## 2014-06-29 NOTE — Telephone Encounter (Signed)
No, needs to get from PCP.

## 2014-06-29 NOTE — Telephone Encounter (Signed)
Patient was returning call to DS. Please call him back at [phone]

## 2014-06-30 NOTE — Telephone Encounter (Signed)
See separate note of 06/29/2014.

## 2014-07-01 ENCOUNTER — Encounter: Payer: Self-pay | Admitting: Gastroenterology

## 2014-07-19 ENCOUNTER — Telehealth: Payer: Self-pay

## 2014-07-19 ENCOUNTER — Other Ambulatory Visit: Payer: Self-pay

## 2014-07-19 DIAGNOSIS — D7389 Other diseases of spleen: Secondary | ICD-10-CM

## 2014-07-19 NOTE — Telephone Encounter (Signed)
Pt could not see Dr.Jenkins since he does not have any insurance so I have sent the referral to [facility] in [place] because they take [employer]

## 2014-07-19 NOTE — Progress Notes (Signed)
Quick Note:  Persistently elevated transaminases, likely due to chronic Hep C. To be thorough, let's check AMA, ANA, ASMA, ceruloplasmin, ferritin, immunoglobulins.   ______

## 2014-07-20 ENCOUNTER — Other Ambulatory Visit: Payer: Self-pay

## 2014-07-20 DIAGNOSIS — R740 Nonspecific elevation of levels of transaminase and lactic acid dehydrogenase [LDH]: Principal | ICD-10-CM

## 2014-07-20 DIAGNOSIS — B182 Chronic viral hepatitis C: Secondary | ICD-10-CM

## 2014-07-20 DIAGNOSIS — R7401 Elevation of levels of liver transaminase levels: Secondary | ICD-10-CM

## 2014-07-20 NOTE — Progress Notes (Signed)
Quick Note:  Called and pt was not at home. LM for him to call. Lab orders faxed to [facility]. ______

## 2014-07-25 NOTE — Progress Notes (Signed)
Quick Note:  LMOM to call. ______ 

## 2014-08-02 ENCOUNTER — Telehealth: Payer: Self-pay

## 2014-08-02 NOTE — Progress Notes (Signed)
Quick Note:  Mailed letter for pt to call. ______ 

## 2014-08-02 NOTE — Telephone Encounter (Signed)
Staff from [facility] called office in regards to pts referral. States that when they called to schedule appt for referral that someone answered the phone and said the pt went back to [place]. Called pt and LMOM to call office back.

## 2014-08-12 ENCOUNTER — Encounter: Payer: Self-pay | Admitting: Gastroenterology

## 2014-08-12 ENCOUNTER — Telehealth: Payer: Self-pay | Admitting: Gastroenterology

## 2014-08-12 ENCOUNTER — Ambulatory Visit: Payer: Self-pay | Admitting: Gastroenterology

## 2014-08-12 NOTE — Telephone Encounter (Signed)
PATIENT WAS A NO SHOW 08/12/14 AND LETTER SENT

## 2014-08-29 NOTE — Telephone Encounter (Signed)
Called pt and no answer on phone number.

## 2015-01-04 ENCOUNTER — Emergency Department (HOSPITAL_BASED_OUTPATIENT_CLINIC_OR_DEPARTMENT_OTHER): Payer: Self-pay

## 2015-01-04 ENCOUNTER — Emergency Department (HOSPITAL_BASED_OUTPATIENT_CLINIC_OR_DEPARTMENT_OTHER)
Admission: EM | Admit: 2015-01-04 | Discharge: 2015-01-04 | Disposition: A | Discharge status: Home / Self Care | Payer: Self-pay | Attending: Emergency Medicine | Admitting: Emergency Medicine

## 2015-01-04 ENCOUNTER — Encounter (HOSPITAL_BASED_OUTPATIENT_CLINIC_OR_DEPARTMENT_OTHER): Payer: Self-pay | Admitting: *Deleted

## 2015-01-04 DIAGNOSIS — I2510 Atherosclerotic heart disease of native coronary artery without angina pectoris: Secondary | ICD-10-CM | POA: Insufficient documentation

## 2015-01-04 DIAGNOSIS — M549 Dorsalgia, unspecified: Secondary | ICD-10-CM | POA: Insufficient documentation

## 2015-01-04 DIAGNOSIS — Z7982 Long term (current) use of aspirin: Secondary | ICD-10-CM | POA: Insufficient documentation

## 2015-01-04 DIAGNOSIS — Z86010 Personal history of colonic polyps: Secondary | ICD-10-CM | POA: Insufficient documentation

## 2015-01-04 DIAGNOSIS — Z8719 Personal history of other diseases of the digestive system: Secondary | ICD-10-CM | POA: Insufficient documentation

## 2015-01-04 DIAGNOSIS — J441 Chronic obstructive pulmonary disease with (acute) exacerbation: Secondary | ICD-10-CM | POA: Insufficient documentation

## 2015-01-04 DIAGNOSIS — Z87828 Personal history of other (healed) physical injury and trauma: Secondary | ICD-10-CM | POA: Insufficient documentation

## 2015-01-04 DIAGNOSIS — I10 Essential (primary) hypertension: Secondary | ICD-10-CM | POA: Insufficient documentation

## 2015-01-04 DIAGNOSIS — Z79899 Other long term (current) drug therapy: Secondary | ICD-10-CM | POA: Insufficient documentation

## 2015-01-04 DIAGNOSIS — R000 Tachycardia, unspecified: Secondary | ICD-10-CM | POA: Insufficient documentation

## 2015-01-04 DIAGNOSIS — R112 Nausea with vomiting, unspecified: Secondary | ICD-10-CM | POA: Insufficient documentation

## 2015-01-04 DIAGNOSIS — Z880 Allergy status to penicillin: Secondary | ICD-10-CM | POA: Insufficient documentation

## 2015-01-04 DIAGNOSIS — R197 Diarrhea, unspecified: Secondary | ICD-10-CM | POA: Insufficient documentation

## 2015-01-04 DIAGNOSIS — Z720 Tobacco use: Secondary | ICD-10-CM | POA: Insufficient documentation

## 2015-01-04 DIAGNOSIS — R079 Chest pain, unspecified: Secondary | ICD-10-CM | POA: Insufficient documentation

## 2015-01-04 LAB — SALICYLATE LEVEL: Salicylate Lvl: <4 mg/dL (ref 2.8–30.0)

## 2015-01-04 LAB — COMPREHENSIVE METABOLIC PANEL
ALK PHOS: 63 U/L (ref 38–126)
ALT: 220 U/L — AB (ref 17–63)
ANION GAP: 6 (ref 5–15)
AST: 273 U/L — ABNORMAL HIGH (ref 15–41)
Albumin: 4 g/dL (ref 3.5–5.0)
BILIRUBIN TOTAL: 0.4 mg/dL (ref 0.3–1.2)
BUN: 14 mg/dL (ref 6–20)
CO2: 25 mmol/L (ref 22–32)
Calcium: 8.9 mg/dL (ref 8.9–10.3)
Chloride: 106 mmol/L (ref 101–111)
Creatinine, Ser: 1.13 mg/dL (ref 0.61–1.24)
GFR calc Af Amer: >60 mL/min (ref >60)
GFR calc non Af Amer: >60 mL/min (ref >60)
Glucose, Bld: 102 mg/dL — ABNORMAL HIGH (ref 65–99)
POTASSIUM: 3.9 mmol/L (ref 3.5–5.1)
SODIUM: 137 mmol/L (ref 135–145)
Total Protein: 7.1 g/dL (ref 6.5–8.1)

## 2015-01-04 LAB — TROPONIN I
Troponin I: <0.03 ng/mL (ref <0.031)
Troponin I: <0.03 ng/mL (ref <0.031)

## 2015-01-04 LAB — CBC
HEMATOCRIT: 52.7 % — AB (ref 39.0–52.0)
Hemoglobin: 18 g/dL — ABNORMAL HIGH (ref 13.0–17.0)
MCH: 32 pg (ref 26.0–34.0)
MCHC: 34.2 g/dL (ref 30.0–36.0)
MCV: 93.8 fL (ref 78.0–100.0)
Platelets: 201 10*3/uL (ref 150–400)
RBC: 5.62 MIL/uL (ref 4.22–5.81)
RDW: 15.6 % — AB (ref 11.5–15.5)
WBC: 12.9 10*3/uL — ABNORMAL HIGH (ref 4.0–10.5)

## 2015-01-04 LAB — ACETAMINOPHEN LEVEL

## 2015-01-04 MED ORDER — ALBUTEROL SULFATE (2.5 MG/3ML) 0.083% IN NEBU
2.5000 mg | INHALATION_SOLUTION | RESPIRATORY_TRACT | Status: DC | PRN
  Administered 2015-01-04: 2.5 mg via RESPIRATORY_TRACT
  Filled 2015-01-04: qty 3

## 2015-01-04 MED ORDER — NAPROXEN 250 MG PO TABS: 250.0000 mg | ORAL_TABLET | Freq: Two times a day (BID) | ORAL | Status: DC

## 2015-01-04 MED ORDER — METHYLPREDNISOLONE SODIUM SUCC 125 MG IJ SOLR
125.0000 mg | Freq: Once | INTRAMUSCULAR | Status: AC
  Administered 2015-01-04: 125 mg via INTRAVENOUS
  Filled 2015-01-04: qty 2

## 2015-01-04 MED ORDER — LISINOPRIL 20 MG PO TABS: 20.0000 mg | ORAL_TABLET | Freq: Every day | ORAL | Status: DC

## 2015-01-04 MED ORDER — ALBUTEROL SULFATE HFA 108 (90 BASE) MCG/ACT IN AERS: 2.0000 | INHALATION_SPRAY | Freq: Four times a day (QID) | RESPIRATORY_TRACT | Status: DC | PRN

## 2015-01-04 MED ORDER — PREDNISONE 20 MG PO TABS: 20.0000 mg | ORAL_TABLET | Freq: Two times a day (BID) | ORAL | Status: DC

## 2015-01-04 MED ORDER — ONDANSETRON HCL 4 MG/2ML IJ SOLN
4.0000 mg | Freq: Once | INTRAMUSCULAR | Status: AC
  Administered 2015-01-04: 4 mg via INTRAVENOUS
  Filled 2015-01-04: qty 2

## 2015-01-04 MED ORDER — MORPHINE SULFATE 4 MG/ML IJ SOLN
4.0000 mg | INTRAMUSCULAR | Status: DC | PRN
  Administered 2015-01-04: 4 mg via INTRAVENOUS
  Filled 2015-01-04: qty 1

## 2015-01-04 NOTE — ED Provider Notes (Signed)
CSN: [id number]     Arrival date & time 01/04/15  1614 History   First MD Initiated Contact with Patient 01/04/15 1617     Chief Complaint  Patient presents with  . Chest Pain     (Consider location/radiation/quality/duration/timing/severity/associated sxs/prior Treatment) Patient is a 49 y.o. male presenting with chest pain. The history is provided by the patient.  Chest Pain Pain location:  L chest Pain quality: sharp   Pain radiates to:  L shoulder Pain severity:  Severe Onset quality:  Gradual Timing:  Intermittent Progression:  Worsening Chronicity:  Recurrent Relieved by:  Nitroglycerin Worsened by:  Movement Associated symptoms: abdominal pain, back pain, cough, nausea and vomiting   Associated symptoms: no dysphagia, no fever, no numbness and no weakness     Jeremy Mcgee is a 49 yo M with PMH of hep C, CAD, COPD/asthma, HTN, Cirrhosis, tobacco and alcohol abuse. As with a 5 day history of substernal chest pain. The pain is intermittent and sharp in nature. It is rated as a 6-7 out of 10. Pain is woken up from sleep and is worse with activity. He has taken 2 nitroglycerin yesterday that were his brothers with improvement in his pain. The pain woke him up at 4 AM this morning and has been intermittent throughout the day today. He has been taking 10-20 tablets of a pain reliever for he is gotten from [employer] store but does not know the medication. He denies any recent surgery but has traveled to [place] in the past couple weeks. Smokes about every day and drinks brandy or beer 3 times a month. Also endorsing abdominal pain, back pain, nausea, vomiting, shortness of breath, and diarrhea.  Past Medical History  Diagnosis Date  . Hypertension   . Coronary artery disease   . Asthma   . COPD (chronic obstructive pulmonary disease)     emphysema  . Abdominal trauma   . 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  . Colon polyps 04/17/2014  . Gastritis  04/17/2014  . Hiatal hernia 04/17/2014    Could have been possible "gastric mass" on CT  . Alcoholic cirrhosis of liver without ascites 04/18/2014  . Hepatitis C antibody test positive 04/18/2014   Past Surgical History  Procedure Laterality Date  . Splenectomy  1974  . Tonsillectomy    . Esophagogastroduodenoscopy N/A 04/16/2014    Dr. Wilhelmina Mcardle SOURCE FOR METASTATIC DISEASE IDENTIFIED/Medium sized hiatal hernia/MODERATE Non-erosive gastritis. H. PYLORI. Treated with Pylera  . Colonoscopy N/A 04/17/2014    Dr. Oneida Alar:The LEFT colon IS redundant/Four COLON polyps IN THE DESCENDING colon and transverse colon  Moderate sized internal hemorrhoids. Polyps not manipulated   Family History  Problem Relation Age of Onset  . Cancer Maternal Grandmother     lung  . Leukemia Maternal Uncle   . Colon cancer Brother     Familial polyposis?  . Stomach cancer Maternal Aunt   . Hypertension Maternal Grandfather   . Heart attack Brother 42  . Stroke Brother 79   History  Substance Use Topics  . Smoking status: Current Every Day Smoker -- 1.00 packs/day    Types: Cigarettes  . Smokeless tobacco: Not on file  . Alcohol Use: No    Review of Systems  Constitutional: Negative for fever.  HENT: Negative for trouble swallowing.   Respiratory: Positive for cough and wheezing.   Cardiovascular: Positive for chest pain.  Gastrointestinal: Positive for nausea, vomiting, abdominal pain and diarrhea. Negative  for constipation.  Genitourinary: Negative for dysuria.  Musculoskeletal: Positive for back pain.  Skin: Negative for color change and rash.  Neurological: Negative for weakness and numbness.      Allergies  Penicillins  Home Medications   Prior to Admission medications   Medication Sig Start Date End Date Taking? Authorizing Provider  atenolol (TENORMIN) 50 MG tablet Take 50 mg by mouth daily.   Yes Historical Provider, MD  diltiazem (CARDIZEM CD) 240 MG 24 hr capsule Take 240 mg by mouth  daily.   Yes Historical Provider, MD  EPINEPHrine (EPIPEN 2-PAK) 0.3 mg/0.3 mL IJ SOAJ injection Inject 0.3 mLs (0.3 mg total) into the muscle once. 06/19/14  Yes Ripley Fraise, MD  pantoprazole (PROTONIX) 40 MG tablet Take 1 tablet (40 mg total) by mouth 2 (two) times daily before a meal. 04/22/14  Yes Kathie Dike, MD  albuterol (PROVENTIL HFA;VENTOLIN HFA) 108 (90 BASE) MCG/ACT inhaler Inhale 2 puffs into the lungs every 6 (six) hours as needed for wheezing or shortness of breath. 01/04/15   Rosemarie Ax, MD  aspirin EC 325 MG tablet Take 325 mg by mouth daily.    Historical Provider, MD  Cyanocobalamin (B-12 PO) Take 1 tablet by mouth daily.    Historical Provider, MD  diphenhydrAMINE (BENADRYL) 25 MG tablet Take 2 tablets (50 mg total) by mouth every 4 (four) hours as needed for itching. 06/19/14   Ripley Fraise, MD  lisinopril (PRINIVIL,ZESTRIL) 20 MG tablet Take 1 tablet (20 mg total) by mouth daily. 01/04/15   Rosemarie Ax, MD  Melatonin 5 MG TABS Take by mouth.    Historical Provider, MD  morphine (MS CONTIN) 30 MG 12 hr tablet Take 1 tablet (30 mg total) by mouth every 12 (twelve) hours. 04/22/14   Kathie Dike, MD  Multiple Vitamin (MULTIVITAMIN WITH MINERALS) TABS tablet Take 1 tablet by mouth daily.    Historical Provider, MD  naproxen (NAPROSYN) 250 MG tablet Take 1 tablet (250 mg total) by mouth 2 (two) times daily with a meal. 01/04/15   Rosemarie Ax, MD  oxyCODONE (OXY IR/ROXICODONE) 5 MG immediate release tablet Take 1 tablet (5 mg total) by mouth every 6 (six) hours as needed for severe pain. 04/22/14   Kathie Dike, MD  predniSONE (DELTASONE) 20 MG tablet Take 1 tablet (20 mg total) by mouth 2 (two) times daily with a meal. 01/04/15   Rosemarie Ax, MD   BP 129/81 mmHg  Pulse 91  Temp(Src) 98 F (36.7 C) (Oral)  Resp 18  Ht 5\' 8"  (1.727 m)  Wt 270 lb (122.471 kg)  BMI 41.06 kg/m2  SpO2 96% Physical Exam  Constitutional: He is oriented to person, place, and  time. He appears well-developed.  Mild distress   HENT:  Head: Normocephalic and atraumatic.  Eyes: Conjunctivae and EOM are normal.  Neck: Normal range of motion. Neck supple.  Cardiovascular: Regular rhythm, normal heart sounds and normal pulses.  Tachycardia present.   Pulmonary/Chest: Effort normal. He has wheezes. He has no rales.  Abdominal: Soft. Bowel sounds are normal. There is no tenderness. There is no rebound and no guarding.  Musculoskeletal: Normal range of motion. He exhibits no edema.  Neurological: He is alert and oriented to person, place, and time.  Skin: Skin is warm. No rash noted. No erythema.    ED Course  Procedures (including critical care time) Labs Review Labs Reviewed  CBC - Abnormal; Notable for the following:    WBC 12.9 (*)  Hemoglobin 18.0 (*)    HCT 52.7 (*)    RDW 15.6 (*)    All other components within normal limits  COMPREHENSIVE METABOLIC PANEL - Abnormal; Notable for the following:    Glucose, Bld 102 (*)    AST 273 (*)    ALT 220 (*)    All other components within normal limits  ACETAMINOPHEN LEVEL - Abnormal; Notable for the following:    Acetaminophen (Tylenol), Serum <10 (*)    All other components within normal limits  TROPONIN I  SALICYLATE LEVEL  TROPONIN I    Imaging Review Dg Chest 2 View  01/04/2015   CLINICAL DATA:  Sharp central chest pain for 4-5 days. Vomiting for 2 days. Shortness of breath.  EXAM: CHEST  2 VIEW  COMPARISON:  06/19/2014  FINDINGS: The cardiac silhouette, mediastinal and hilar contours are within normal limits and stable. There are mild chronic bronchitic changes and streaky bibasilar atelectasis but no infiltrates, edema or effusions. The bony thorax is intact.  IMPRESSION: Mild chronic bronchitic changes and streaky bibasilar atelectasis but no infiltrates or effusions.   Electronically Signed   By: Marijo Sanes M.D.   On: 01/04/2015 17:01     EKG Interpretation   Date/Time:  Wednesday January 04 2015  16:24:47 EDT Ventricular Rate:  106 PR Interval:  126 QRS Duration: 72 QT Interval:  338 QTC Calculation: 448 R Axis:   57 Text Interpretation:  Sinus tachycardia Otherwise normal ECG Confirmed by  Jeneen Rinks  MD, [place] [facility] ([id number]) on 01/04/2015 4:31:01 PM     Medications  morphine 4 MG/ML injection 4 mg (4 mg Intravenous Given 01/04/15 1749)  albuterol (PROVENTIL) (2.5 MG/3ML) 0.083% nebulizer solution 2.5 mg (2.5 mg Nebulization Given 01/04/15 1740)  ondansetron (ZOFRAN) injection 4 mg (4 mg Intravenous Given 01/04/15 1748)  methylPREDNISolone sodium succinate (SOLU-MEDROL) 125 mg/2 mL injection 125 mg (125 mg Intravenous Given 01/04/15 1748)     MDM   Final diagnoses:  Chest pain, unspecified chest pain type   Jeremy Mcgee p/w chest pain. Will obtain CMP, CBC, troponin and CXR. Unsure of the medications that he has been taking OTC and from his brother. Will obtain salicylate and acetaminophen level.   Lab work not revealing of toxicity and troponin's normal. CXR not showing pna. Breathing improved with steroids. Most likely has a component of his COPD/asthma that is causing his pain vs MSK pain in origin. Will give prednisone for the next 5 days 20 mg BID and albuterol inhaler. Also refilled his lisinopril.  Naproxen for pain and told to avoid tylenol and encouraged to stop drinking EtOH. Informed to establish care for his ongoing chronic problems. Given information about community health and wellness. Patient agrees with plan and discharge.   Rosemarie Ax, MD PGY-2, [facility] 01/04/2015, 8:04 PM      Rosemarie Ax, MD 01/04/15 2005  Rosemarie Ax, MD 01/11/15 [place], MD 01/13/15 [phone]

## 2015-01-04 NOTE — ED Notes (Signed)
Pt c/o intermittent, sharp central CP x4-5 days. Pt sts he began vomiting 2 days ago. Pt also c/o SOB, N/V, dizziness and back pain.

## 2015-01-04 NOTE — ED Notes (Signed)
MD at bedside. resident

## 2015-01-04 NOTE — Discharge Instructions (Signed)

## 2015-01-04 NOTE — ED Provider Notes (Signed)
Pt seen and evaluated.  D/W Dr. Raeford Razor.. Pt examined.  Mutiple c/o.  Intermittent sharp CP for 5 days.  N/V for 48 hours.  Chronic SOB off of MDIs and continued tobacco abuse.  Hepatitis/cirrhosis, taking acetaminophen and ETOH use greater than 1day/week.  His M shows mild prolongation. He does clear and has noticed wheezing after albuterol. Given IV Solu-Medrol. Yes refill on his blood pressure medication. I spoke with him about using another choice of the beta blocker with his history of bronchospasm. Given a prescription for lisinopril. Albuterol and prednisone prescriptions. Strongly encouraged to stop smoking and drinking. Primary care follow-up. No indications for admission or further testing.    Tanna Furry, MD 01/04/15 2001

## 2017-01-12 DIAGNOSIS — M549 Dorsalgia, unspecified: Secondary | ICD-10-CM

## 2017-01-12 DIAGNOSIS — I10 Essential (primary) hypertension: Secondary | ICD-10-CM | POA: Diagnosis not present

## 2017-01-12 DIAGNOSIS — J441 Chronic obstructive pulmonary disease with (acute) exacerbation: Secondary | ICD-10-CM

## 2017-01-12 DIAGNOSIS — R0602 Shortness of breath: Secondary | ICD-10-CM

## 2017-01-12 DIAGNOSIS — F17210 Nicotine dependence, cigarettes, uncomplicated: Secondary | ICD-10-CM

## 2017-01-13 ENCOUNTER — Inpatient Hospital Stay (HOSPITAL_COMMUNITY): Payer: Medicare Other

## 2017-01-13 ENCOUNTER — Inpatient Hospital Stay (HOSPITAL_COMMUNITY)
Admission: EM | Admit: 2017-01-13 | Discharge: 2017-01-21 | DRG: 190 | Discharge status: Against Medical Advice | Payer: Medicare Other | Source: Other Acute Inpatient Hospital | Attending: Internal Medicine | Admitting: Internal Medicine

## 2017-01-13 ENCOUNTER — Encounter (HOSPITAL_COMMUNITY): Payer: Self-pay | Admitting: Family Medicine

## 2017-01-13 DIAGNOSIS — I2510 Atherosclerotic heart disease of native coronary artery without angina pectoris: Secondary | ICD-10-CM | POA: Diagnosis present

## 2017-01-13 DIAGNOSIS — F17210 Nicotine dependence, cigarettes, uncomplicated: Secondary | ICD-10-CM | POA: Diagnosis present

## 2017-01-13 DIAGNOSIS — F40240 Claustrophobia: Secondary | ICD-10-CM | POA: Diagnosis not present

## 2017-01-13 DIAGNOSIS — G8929 Other chronic pain: Secondary | ICD-10-CM | POA: Diagnosis present

## 2017-01-13 DIAGNOSIS — R29898 Other symptoms and signs involving the musculoskeletal system: Secondary | ICD-10-CM | POA: Diagnosis not present

## 2017-01-13 DIAGNOSIS — R296 Repeated falls: Secondary | ICD-10-CM | POA: Diagnosis present

## 2017-01-13 DIAGNOSIS — R740 Nonspecific elevation of levels of transaminase and lactic acid dehydrogenase [LDH]: Secondary | ICD-10-CM | POA: Diagnosis present

## 2017-01-13 DIAGNOSIS — M5442 Lumbago with sciatica, left side: Secondary | ICD-10-CM | POA: Diagnosis not present

## 2017-01-13 DIAGNOSIS — R609 Edema, unspecified: Secondary | ICD-10-CM | POA: Diagnosis present

## 2017-01-13 DIAGNOSIS — Z720 Tobacco use: Secondary | ICD-10-CM | POA: Diagnosis present

## 2017-01-13 DIAGNOSIS — M79609 Pain in unspecified limb: Secondary | ICD-10-CM | POA: Diagnosis not present

## 2017-01-13 DIAGNOSIS — M5441 Lumbago with sciatica, right side: Secondary | ICD-10-CM | POA: Diagnosis present

## 2017-01-13 DIAGNOSIS — Z9981 Dependence on supplemental oxygen: Secondary | ICD-10-CM

## 2017-01-13 DIAGNOSIS — E538 Deficiency of other specified B group vitamins: Secondary | ICD-10-CM | POA: Diagnosis present

## 2017-01-13 DIAGNOSIS — Z880 Allergy status to penicillin: Secondary | ICD-10-CM

## 2017-01-13 DIAGNOSIS — Z7952 Long term (current) use of systemic steroids: Secondary | ICD-10-CM

## 2017-01-13 DIAGNOSIS — IMO0002 Reserved for concepts with insufficient information to code with codable children: Secondary | ICD-10-CM

## 2017-01-13 DIAGNOSIS — M5416 Radiculopathy, lumbar region: Secondary | ICD-10-CM | POA: Diagnosis present

## 2017-01-13 DIAGNOSIS — Z79891 Long term (current) use of opiate analgesic: Secondary | ICD-10-CM

## 2017-01-13 DIAGNOSIS — M545 Low back pain, unspecified: Secondary | ICD-10-CM

## 2017-01-13 DIAGNOSIS — R0602 Shortness of breath: Secondary | ICD-10-CM

## 2017-01-13 DIAGNOSIS — N179 Acute kidney failure, unspecified: Secondary | ICD-10-CM | POA: Diagnosis not present

## 2017-01-13 DIAGNOSIS — J9611 Chronic respiratory failure with hypoxia: Secondary | ICD-10-CM | POA: Diagnosis present

## 2017-01-13 DIAGNOSIS — I2583 Coronary atherosclerosis due to lipid rich plaque: Secondary | ICD-10-CM

## 2017-01-13 DIAGNOSIS — Z79899 Other long term (current) drug therapy: Secondary | ICD-10-CM

## 2017-01-13 DIAGNOSIS — J189 Pneumonia, unspecified organism: Secondary | ICD-10-CM | POA: Diagnosis present

## 2017-01-13 DIAGNOSIS — D72829 Elevated white blood cell count, unspecified: Secondary | ICD-10-CM | POA: Diagnosis present

## 2017-01-13 DIAGNOSIS — J441 Chronic obstructive pulmonary disease with (acute) exacerbation: Secondary | ICD-10-CM | POA: Diagnosis present

## 2017-01-13 DIAGNOSIS — R258 Other abnormal involuntary movements: Secondary | ICD-10-CM | POA: Diagnosis present

## 2017-01-13 DIAGNOSIS — Z6841 Body Mass Index (BMI) 40.0 and over, adult: Secondary | ICD-10-CM

## 2017-01-13 DIAGNOSIS — J9621 Acute and chronic respiratory failure with hypoxia: Secondary | ICD-10-CM | POA: Diagnosis present

## 2017-01-13 DIAGNOSIS — I10 Essential (primary) hypertension: Secondary | ICD-10-CM | POA: Diagnosis present

## 2017-01-13 DIAGNOSIS — Z8249 Family history of ischemic heart disease and other diseases of the circulatory system: Secondary | ICD-10-CM

## 2017-01-13 DIAGNOSIS — K7030 Alcoholic cirrhosis of liver without ascites: Secondary | ICD-10-CM | POA: Diagnosis present

## 2017-01-13 DIAGNOSIS — J440 Chronic obstructive pulmonary disease with acute lower respiratory infection: Secondary | ICD-10-CM | POA: Diagnosis not present

## 2017-01-13 DIAGNOSIS — G9341 Metabolic encephalopathy: Secondary | ICD-10-CM | POA: Diagnosis not present

## 2017-01-13 DIAGNOSIS — R0600 Dyspnea, unspecified: Secondary | ICD-10-CM | POA: Diagnosis present

## 2017-01-13 DIAGNOSIS — R7303 Prediabetes: Secondary | ICD-10-CM | POA: Diagnosis present

## 2017-01-13 DIAGNOSIS — R7401 Elevation of levels of liver transaminase levels: Secondary | ICD-10-CM

## 2017-01-13 DIAGNOSIS — T380X5A Adverse effect of glucocorticoids and synthetic analogues, initial encounter: Secondary | ICD-10-CM | POA: Diagnosis present

## 2017-01-13 DIAGNOSIS — Z91030 Bee allergy status: Secondary | ICD-10-CM

## 2017-01-13 DIAGNOSIS — R7989 Other specified abnormal findings of blood chemistry: Secondary | ICD-10-CM

## 2017-01-13 DIAGNOSIS — Z7982 Long term (current) use of aspirin: Secondary | ICD-10-CM

## 2017-01-13 DIAGNOSIS — R062 Wheezing: Secondary | ICD-10-CM

## 2017-01-13 DIAGNOSIS — Z86010 Personal history of colonic polyps: Secondary | ICD-10-CM

## 2017-01-13 DIAGNOSIS — E662 Morbid (severe) obesity with alveolar hypoventilation: Secondary | ICD-10-CM | POA: Diagnosis present

## 2017-01-13 DIAGNOSIS — J181 Lobar pneumonia, unspecified organism: Secondary | ICD-10-CM | POA: Diagnosis not present

## 2017-01-13 DIAGNOSIS — R739 Hyperglycemia, unspecified: Secondary | ICD-10-CM | POA: Diagnosis present

## 2017-01-13 DIAGNOSIS — J9622 Acute and chronic respiratory failure with hypercapnia: Secondary | ICD-10-CM | POA: Diagnosis not present

## 2017-01-13 DIAGNOSIS — M549 Dorsalgia, unspecified: Secondary | ICD-10-CM | POA: Diagnosis not present

## 2017-01-13 DIAGNOSIS — G4733 Obstructive sleep apnea (adult) (pediatric): Secondary | ICD-10-CM | POA: Diagnosis not present

## 2017-01-13 DIAGNOSIS — M5440 Lumbago with sciatica, unspecified side: Secondary | ICD-10-CM | POA: Diagnosis not present

## 2017-01-13 MED ORDER — POLYETHYLENE GLYCOL 3350 17 G PO PACK
17.0000 g | PACK | Freq: Every day | ORAL | Status: DC | PRN
  Administered 2017-01-14 – 2017-01-20 (×2): 17 g via ORAL
  Filled 2017-01-13 (×2): qty 1

## 2017-01-13 MED ORDER — PANTOPRAZOLE SODIUM 40 MG PO TBEC
40.0000 mg | DELAYED_RELEASE_TABLET | Freq: Two times a day (BID) | ORAL | Status: DC
  Administered 2017-01-14 – 2017-01-21 (×16): 40 mg via ORAL
  Filled 2017-01-13 (×16): qty 1

## 2017-01-13 MED ORDER — MOMETASONE FURO-FORMOTEROL FUM 200-5 MCG/ACT IN AERO
2.0000 | INHALATION_SPRAY | Freq: Two times a day (BID) | RESPIRATORY_TRACT | Status: DC
  Administered 2017-01-14 – 2017-01-20 (×12): 2 via RESPIRATORY_TRACT
  Filled 2017-01-13: qty 8.8

## 2017-01-13 MED ORDER — ADULT MULTIVITAMIN W/MINERALS CH
1.0000 | ORAL_TABLET | Freq: Every day | ORAL | Status: DC
  Administered 2017-01-14 – 2017-01-21 (×8): 1 via ORAL
  Filled 2017-01-13 (×8): qty 1

## 2017-01-13 MED ORDER — ATENOLOL 50 MG PO TABS
50.0000 mg | ORAL_TABLET | Freq: Every day | ORAL | Status: DC
  Administered 2017-01-14 – 2017-01-21 (×8): 50 mg via ORAL
  Filled 2017-01-13 (×8): qty 1

## 2017-01-13 MED ORDER — MELATONIN 3 MG PO TABS
4.5000 mg | ORAL_TABLET | Freq: Every day | ORAL | Status: DC
  Administered 2017-01-14 – 2017-01-20 (×8): 4.5 mg via ORAL
  Filled 2017-01-13 (×9): qty 1.5

## 2017-01-13 MED ORDER — INSULIN ASPART 100 UNIT/ML ~~LOC~~ SOLN
0.0000 [IU] | Freq: Three times a day (TID) | SUBCUTANEOUS | Status: DC
  Administered 2017-01-14 – 2017-01-15 (×3): 1 [IU] via SUBCUTANEOUS

## 2017-01-13 MED ORDER — MORPHINE SULFATE ER 30 MG PO TBCR
30.0000 mg | EXTENDED_RELEASE_TABLET | Freq: Two times a day (BID) | ORAL | Status: DC
  Administered 2017-01-14 – 2017-01-19 (×12): 30 mg via ORAL
  Filled 2017-01-13 (×12): qty 1

## 2017-01-13 MED ORDER — LISINOPRIL 20 MG PO TABS
20.0000 mg | ORAL_TABLET | Freq: Every day | ORAL | Status: DC
  Administered 2017-01-14 – 2017-01-15 (×2): 20 mg via ORAL
  Filled 2017-01-13 (×2): qty 1

## 2017-01-13 MED ORDER — DILTIAZEM HCL ER COATED BEADS 240 MG PO CP24
240.0000 mg | ORAL_CAPSULE | Freq: Every day | ORAL | Status: DC
  Administered 2017-01-15 – 2017-01-17 (×3): 240 mg via ORAL
  Filled 2017-01-13 (×4): qty 1

## 2017-01-13 MED ORDER — ACETAMINOPHEN 650 MG RE SUPP: 650.0000 mg | Freq: Four times a day (QID) | RECTAL | Status: DC | PRN

## 2017-01-13 MED ORDER — ONDANSETRON HCL 4 MG/2ML IJ SOLN
4.0000 mg | Freq: Four times a day (QID) | INTRAMUSCULAR | Status: DC | PRN
  Administered 2017-01-14 – 2017-01-18 (×3): 4 mg via INTRAVENOUS
  Filled 2017-01-13 (×4): qty 2

## 2017-01-13 MED ORDER — SODIUM CHLORIDE 0.9 % IV SOLN: 250.0000 mL | INTRAVENOUS | Status: DC | PRN

## 2017-01-13 MED ORDER — VITAMIN B-12 1000 MCG PO TABS
1000.0000 ug | ORAL_TABLET | Freq: Every day | ORAL | Status: DC
  Administered 2017-01-14 – 2017-01-21 (×8): 1000 ug via ORAL
  Filled 2017-01-13 (×8): qty 1

## 2017-01-13 MED ORDER — B-12 1000 MCG PO CAPS: 1.0000 | ORAL_CAPSULE | Freq: Every day | ORAL | Status: DC

## 2017-01-13 MED ORDER — DIAZEPAM 5 MG PO TABS
5.0000 mg | ORAL_TABLET | Freq: Three times a day (TID) | ORAL | Status: DC | PRN
  Administered 2017-01-14 (×3): 10 mg via ORAL
  Filled 2017-01-13 (×3): qty 2

## 2017-01-13 MED ORDER — DEXTROSE 5 % IV SOLN
1.0000 g | INTRAVENOUS | Status: AC
  Administered 2017-01-14 – 2017-01-19 (×5): 1 g via INTRAVENOUS
  Filled 2017-01-13 (×6): qty 10

## 2017-01-13 MED ORDER — HYDROMORPHONE HCL 2 MG PO TABS
4.0000 mg | ORAL_TABLET | ORAL | Status: DC | PRN
  Administered 2017-01-14 – 2017-01-19 (×13): 4 mg via ORAL
  Filled 2017-01-13 (×13): qty 2

## 2017-01-13 MED ORDER — OXYCODONE HCL 5 MG PO TABS
5.0000 mg | ORAL_TABLET | Freq: Four times a day (QID) | ORAL | Status: DC | PRN
Start: 2017-01-13 — End: 2017-01-21
  Administered 2017-01-14 – 2017-01-21 (×9): 5 mg via ORAL
  Filled 2017-01-13 (×9): qty 1

## 2017-01-13 MED ORDER — METHYLPREDNISOLONE SODIUM SUCC 125 MG IJ SOLR
60.0000 mg | Freq: Four times a day (QID) | INTRAMUSCULAR | Status: DC
  Administered 2017-01-14 – 2017-01-15 (×6): 60 mg via INTRAVENOUS
  Filled 2017-01-13 (×6): qty 2

## 2017-01-13 MED ORDER — ENOXAPARIN SODIUM 40 MG/0.4ML ~~LOC~~ SOLN
40.0000 mg | Freq: Every day | SUBCUTANEOUS | Status: DC
  Administered 2017-01-14 – 2017-01-20 (×8): 40 mg via SUBCUTANEOUS
  Filled 2017-01-13 (×8): qty 0.4

## 2017-01-13 MED ORDER — IPRATROPIUM-ALBUTEROL 0.5-2.5 (3) MG/3ML IN SOLN
3.0000 mL | RESPIRATORY_TRACT | Status: DC | PRN
  Administered 2017-01-14 – 2017-01-17 (×5): 3 mL via RESPIRATORY_TRACT
  Filled 2017-01-13 (×5): qty 3

## 2017-01-13 MED ORDER — SODIUM CHLORIDE 0.9% FLUSH: 3.0000 mL | INTRAVENOUS | Status: DC | PRN

## 2017-01-13 MED ORDER — DEXTROSE 5 % IV SOLN
500.0000 mg | INTRAVENOUS | Status: DC
  Administered 2017-01-14 – 2017-01-17 (×4): 500 mg via INTRAVENOUS
  Filled 2017-01-13 (×5): qty 500

## 2017-01-13 MED ORDER — ACETAMINOPHEN 325 MG PO TABS
650.0000 mg | ORAL_TABLET | Freq: Four times a day (QID) | ORAL | Status: DC | PRN
  Administered 2017-01-20 (×2): 650 mg via ORAL
  Filled 2017-01-13 (×2): qty 2

## 2017-01-13 MED ORDER — DIPHENHYDRAMINE HCL 25 MG PO CAPS
50.0000 mg | ORAL_CAPSULE | ORAL | Status: DC | PRN
  Administered 2017-01-14 – 2017-01-20 (×4): 50 mg via ORAL
  Filled 2017-01-13 (×4): qty 2

## 2017-01-13 MED ORDER — BISACODYL 5 MG PO TBEC
5.0000 mg | DELAYED_RELEASE_TABLET | Freq: Every day | ORAL | Status: DC | PRN
  Administered 2017-01-14 – 2017-01-18 (×4): 5 mg via ORAL
  Filled 2017-01-13 (×5): qty 1

## 2017-01-13 MED ORDER — ONDANSETRON HCL 4 MG PO TABS: 4.0000 mg | ORAL_TABLET | Freq: Four times a day (QID) | ORAL | Status: DC | PRN

## 2017-01-13 MED ORDER — ASPIRIN EC 325 MG PO TBEC
325.0000 mg | DELAYED_RELEASE_TABLET | Freq: Every day | ORAL | Status: DC
  Administered 2017-01-14 – 2017-01-21 (×8): 325 mg via ORAL
  Filled 2017-01-13 (×8): qty 1

## 2017-01-13 MED ORDER — NAPROXEN 250 MG PO TABS
250.0000 mg | ORAL_TABLET | Freq: Two times a day (BID) | ORAL | Status: DC
  Administered 2017-01-14 – 2017-01-15 (×3): 250 mg via ORAL
  Filled 2017-01-13 (×4): qty 1

## 2017-01-13 MED ORDER — SODIUM CHLORIDE 0.9% FLUSH
3.0000 mL | Freq: Two times a day (BID) | INTRAVENOUS | Status: DC
  Administered 2017-01-13 – 2017-01-21 (×12): 3 mL via INTRAVENOUS

## 2017-01-13 NOTE — H&P (Signed)
History and Physical    Jeremy Mcgee JIR:[id number] DOB: 09-Sep-1965 DOA: 01/13/2017  PCP: Patient, No Pcp Per   Patient coming from:  Home, by way of [place] [address]   Chief Complaint: Dyspnea, wheezing, severe LBP with b/l leg weakness  HPI: Jeremy Mcgee is a 51 y.o. male with medical history significant for COPD with chronic hypoxic respiratory failure, alcoholic cirrhosis without ascites, remote alcohol abuse, coronary artery disease, hypertension, and chronic low back pain with bilateral radiculopathy, now presenting in transfer from [facility] where the patient was admitted on 01/12/2017 with acute exacerbation and COPD, and severe chronic back pain with recurrent falls over the past few months attributed to bilateral lower extremity weakness. Patient reports that he has suffered from chronic severe low back pain for many years and had previously been evaluated by a surgeon, but the patient was not healthy enough for surgery at that time per his report. He  had degenerative disc disease involving the lumbar spine and has been treated with steroid shots 7. he follows with pain management and is treated with long-acting morphine and OxyIR when necessary. He reports lower extremity weakness bilaterally that seems to be intermittent and has been present for 3-4 months. Over this interval, he has experienced "buckling" of his knees bilaterally, resulting in falls. He denies any associated head injury or loss of consciousness. He denies any significant pain as a result of the falls. He denies recent fevers or chills, but notes increased cough with wheezing and worsening dyspnea over the past several days. In addition, the patient notes intermittent coarse tremor involving the right upper extremity for the past 3-4 months.  [facility] Course:  Patient was admitted to [facility] on 01/12/2017 for further evaluation and management of acute exacerbation and COPD and ongoing severe low back  pain with reports of intermittent bilateral lower extremity weakness and resulting falls. He had an EKG which was normal sinus rhythm and normal EKG. Troponins were negative 3 and he had a negative d-dimer. Chemistry panel was notable for hyperglycemia and CBC was notable for leukocytosis only. Normal INR was obtained and B12 and folate levels were also noted to be normal. Patient had a chest x-ray on 01/12/2017 which is negative for acute cardiopulmonary disease. He was started on IV Solu-Medrol and nebs for his COPD exacerbation, and given his severe exacerbation and history of splenectomy, he was also treated with azithromycin and Rocephin. Attempts were made to obtain an MRI for further evaluation of his low back pain with leg weakness, but the patient has been unable to tolerate this secondary to severe claustrophobia. It was felt that he would likely need an open MRI, and possibly a neurosurgery consult based on the findings, and for this reason, transfer to [facility] was arranged.   Patient arrives at [facility] mildly tachypneic and dyspneic with speech, but hemodynamically stable, saturating and mentating well on his usual 2 L/m supplemental oxygen. Will be admitted to the medical surgical unit for ongoing evaluation and management of the aforementioned problems.  Review of Systems:  All other systems reviewed and apart from HPI, are negative.  Past Medical History:  Diagnosis Date  . Abdominal trauma   . Alcoholic cirrhosis of liver without ascites ([place]) 04/18/2014  . Asthma   . Colon polyps 04/17/2014  . COPD (chronic obstructive pulmonary disease) ([facility])    emphysema  . Coronary artery disease   . Gastritis 04/17/2014  . Hepatitis C antibody test positive 04/18/2014  .  Hiatal hernia 04/17/2014   Could have been possible "gastric mass" on CT  . 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  . Hypertension     Past Surgical History:  Procedure Laterality  Date  . COLONOSCOPY N/A 04/17/2014   Dr. Oneida Alar:The LEFT colon IS redundant/Four COLON polyps IN THE DESCENDING colon and transverse colon  Moderate sized internal hemorrhoids. Polyps not manipulated  . ESOPHAGOGASTRODUODENOSCOPY N/A 04/16/2014   Dr. Wilhelmina Mcardle SOURCE FOR METASTATIC DISEASE IDENTIFIED/Medium sized hiatal hernia/MODERATE Non-erosive gastritis. H. PYLORI. Treated with Pylera  . SPLENECTOMY  1974  . TONSILLECTOMY       reports that he has been smoking Cigarettes.  He has been smoking about 1.00 pack per day. He does not have any smokeless tobacco history on file. He reports that he does not drink alcohol or use drugs.  Allergies  Allergen Reactions  . Penicillins Hives    Family History  Problem Relation Age of Onset  . Cancer Maternal Grandmother        lung  . Leukemia Maternal Uncle   . Colon cancer Brother        Familial polyposis?  . Stomach cancer Maternal Aunt   . Hypertension Maternal Grandfather   . Heart attack Brother 24  . Stroke Brother 32     Prior to Admission medications   Medication Sig Start Date End Date Taking? Authorizing Provider  albuterol (PROVENTIL HFA;VENTOLIN HFA) 108 (90 BASE) MCG/ACT inhaler Inhale 2 puffs into the lungs every 6 (six) hours as needed for wheezing or shortness of breath. 01/04/15   Rosemarie Ax, MD  aspirin EC 325 MG tablet Take 325 mg by mouth daily.    [provider]  atenolol (TENORMIN) 50 MG tablet Take 50 mg by mouth daily.    [provider]  Cyanocobalamin (B-12 PO) Take 1 tablet by mouth daily.    [provider]  diltiazem (CARDIZEM CD) 240 MG 24 hr capsule Take 240 mg by mouth daily.    [provider]  diphenhydrAMINE (BENADRYL) 25 MG tablet Take 2 tablets (50 mg total) by mouth every 4 (four) hours as needed for itching. 06/19/14   Ripley Fraise, MD  EPINEPHrine (EPIPEN 2-PAK) 0.3 mg/0.3 mL IJ SOAJ injection Inject 0.3 mLs (0.3 mg total) into the muscle once. 06/19/14    Ripley Fraise, MD  lisinopril (PRINIVIL,ZESTRIL) 20 MG tablet Take 1 tablet (20 mg total) by mouth daily. 01/04/15   Rosemarie Ax, MD  Melatonin 5 MG TABS Take by mouth.    [provider]  morphine (MS CONTIN) 30 MG 12 hr tablet Take 1 tablet (30 mg total) by mouth every 12 (twelve) hours. 04/22/14   Kathie Dike, MD  Multiple Vitamin (MULTIVITAMIN WITH MINERALS) TABS tablet Take 1 tablet by mouth daily.    [provider]  naproxen (NAPROSYN) 250 MG tablet Take 1 tablet (250 mg total) by mouth 2 (two) times daily with a meal. 01/04/15   Rosemarie Ax, MD  oxyCODONE (OXY IR/ROXICODONE) 5 MG immediate release tablet Take 1 tablet (5 mg total) by mouth every 6 (six) hours as needed for severe pain. 04/22/14   Kathie Dike, MD  pantoprazole (PROTONIX) 40 MG tablet Take 1 tablet (40 mg total) by mouth 2 (two) times daily before a meal. 04/22/14   Kathie Dike, MD  predniSONE (DELTASONE) 20 MG tablet Take 1 tablet (20 mg total) by mouth 2 (two) times daily with a meal. 01/04/15  Rosemarie Ax, MD    Physical Exam: Vitals:   01/13/17 2256  BP: 132/82  Pulse: 96  Resp: 17  Temp: 98.5 F (36.9 C)  TempSrc: Oral  SpO2: 94%  Weight: 130.2 kg (287 lb 1.6 oz)  Height: 5\' 8"  (1.727 m)      Constitutional: Dyspnea with speech, mild tachypnea, labile emotions. No pallor or diaphoresis.  Eyes: PERTLA, lids and conjunctivae normal ENMT: Mucous membranes are moist. Posterior pharynx clear of any exudate or lesions.   Neck: normal, supple, no masses, no thyromegaly Respiratory: Diminished breath sounds bilaterally with expiratory wheezes. No accessory muscle use.  Cardiovascular: S1 & S2 heard, regular rate and rhythm. No significant JVD. Abdomen: No distension, no tenderness, no masses palpated. Bowel sounds normal.  Musculoskeletal: Midline and paraspinous soft-tissue low-back tenderness, no mass appreciated. No joint deformity upper and lower extremities. Normal  muscle tone.  Skin: no significant rashes, lesions, ulcers. Warm, dry, well-perfused. Ecchymosis at left flank. Neurologic: CN 2-12 grossly intact. Sensation intact, coarse resting tremor involving bilateral UE's, Rt > Lt. Grip strength 5/5 bilaterally. Proximal LE strength testing precluded by severe pain; plantar- and dorsiflexion 3-4/5 bilaterally.  Psychiatric: Alert and oriented x 3. Labile emotions, cooperative.     Labs on Admission: I have personally reviewed following labs and imaging studies  CBC: No results for input(s): WBC, NEUTROABS, HGB, HCT, MCV, PLT in the last 168 hours. Basic Metabolic Panel: No results for input(s): NA, K, CL, CO2, GLUCOSE, BUN, CREATININE, CALCIUM, MG, PHOS in the last 168 hours. GFR: CrCl cannot be calculated (Patient's most recent lab result is older than the maximum 21 days allowed.). Liver Function Tests: No results for input(s): AST, ALT, ALKPHOS, BILITOT, PROT, ALBUMIN in the last 168 hours. No results for input(s): LIPASE, AMYLASE in the last 168 hours. No results for input(s): AMMONIA in the last 168 hours. Coagulation Profile: No results for input(s): INR, PROTIME in the last 168 hours. Cardiac Enzymes: No results for input(s): CKTOTAL, CKMB, CKMBINDEX, TROPONINI in the last 168 hours. BNP (last 3 results) No results for input(s): PROBNP in the last 8760 hours. HbA1C: No results for input(s): HGBA1C in the last 72 hours. CBG: No results for input(s): GLUCAP in the last 168 hours. Lipid Profile: No results for input(s): CHOL, HDL, LDLCALC, TRIG, CHOLHDL, LDLDIRECT in the last 72 hours. Thyroid Function Tests: No results for input(s): TSH, T4TOTAL, FREET4, T3FREE, THYROIDAB in the last 72 hours. Anemia Panel: No results for input(s): VITAMINB12, FOLATE, FERRITIN, TIBC, IRON, RETICCTPCT in the last 72 hours. Urine analysis:    Component Value Date/Time   COLORURINE YELLOW 04/15/2014 1738   APPEARANCEUR CLEAR 04/15/2014 1738   LABSPEC  1.015 04/15/2014 1738   PHURINE 6.0 04/15/2014 1738   GLUCOSEU NEGATIVE 04/15/2014 1738   HGBUR NEGATIVE 04/15/2014 1738   BILIRUBINUR NEGATIVE 04/15/2014 1738   KETONESUR NEGATIVE 04/15/2014 1738   PROTEINUR NEGATIVE 04/15/2014 1738   UROBILINOGEN 0.2 04/15/2014 1738   NITRITE NEGATIVE 04/15/2014 1738   LEUKOCYTESUR NEGATIVE 04/15/2014 1738   Sepsis Labs: @LABRCNTIP (procalcitonin:4,lacticidven:4) )No results found for this or any previous visit (from the past 240 hour(s)).   Radiological Exams on Admission: No results found.  EKG: Independently reviewed. Normal sinus rhythm (in paper chart from [place], performed 01/12/17).   Assessment/Plan  1. COPD with acute exacerbation - Pt has centrilobular emphysema with chronic hypoxic respiratory failure, requiring 2-3 Lpm supplemental O2 atc  - He presents in acute exacerbation with increased cough and dyspnea, obstructed and wheezing  on exam  - Continue systemic steroid with IV Solu-Medrol 60 mg q6h, Dulera, prn nebs, supplemental O2; continue abx with Rocephin and azithromycin    2. Chronic low back pain with bilateral LE weakness  - Pt presents with chronic LBP and bilateral sciatica, worse over the last 3-4 months with bilateral LE weakness that has been intermittent, leading to falls at home  - No incontinence or saddle anesthesia; denies fevers or sweats, denies wt loss  - He has been evaluated by NSG outpatient in the past, never had surgery; has had steroid injections x7  - MRI was attempted at [facility], but pt unable to tolerate d/t severe claustrophobia  - Transferred to [facility] for possible MRI with conscience sedation; may need anesthesia consultation for this  - Continue pain-control, PT eval and treatment  3. Tremor  - Pt reports 3-4 months of tremor involving the UE's, right > left  - Coarse resting tremor noted  - Unclear etiology, not causing significant distress for patient; check ammonia given hx  cirrhosis  4. CAD - No anginal complaints  - EKG in paper chart from 01/12/17 demonstrates NSR and is a normal EKG  - Serial troponin measurements were obtained at the outside hospital in light of his dyspnea; they were undetectable x3  - Continue beta-blocker, ASA, and ACE-i   5. Hypertension  - BP at goal  - Continue lisinopril, atenolol, and diltiazem as tolerated    6. Hyperglycemia  - Serum glucose consistently elevated at outside hospital  - Pt will be on systemic steroids for exacerbation in COPD   - Check CBG with meals and qHS; start a low-intensity Novolog correctional   7. Alcoholic liver cirrhosis   - Appears well-compensated  - Pt reports strict abstinence from alcohol for many years  - Follow electrolytes, continue PPI   - Check ammonia level given the new tremor    DVT prophylaxis: sq Lovenox Code Status: Full  Family Communication: Discussed with patient Disposition Plan: Admit to med-surg Consults called: None Admission status: Inpatient    Vianne Bulls, MD [facility] Pager [phone]  If 7PM-7AM, please contact night-coverage [email or web address] Password [facility]  01/13/2017, 11:39 PM

## 2017-01-13 NOTE — Progress Notes (Signed)
Admitted pt from [facility]. Alert, oriented x4 , on 2L of O2 per [place]. Placed on the telemetry box 16 and verified by another RN. Skin assessment done with another RN. Oriented to call bell and room. Educated the fall prevention patient safety plan with understanding. Admission consult was notified.

## 2017-01-14 ENCOUNTER — Encounter (HOSPITAL_COMMUNITY): Payer: Self-pay | Admitting: *Deleted

## 2017-01-14 DIAGNOSIS — R740 Nonspecific elevation of levels of transaminase and lactic acid dehydrogenase [LDH]: Secondary | ICD-10-CM

## 2017-01-14 DIAGNOSIS — J189 Pneumonia, unspecified organism: Secondary | ICD-10-CM | POA: Diagnosis present

## 2017-01-14 LAB — VITAMIN B12: Vitamin B-12: 335 pg/mL (ref 180–914)

## 2017-01-14 LAB — GLUCOSE, CAPILLARY
GLUCOSE-CAPILLARY: 139 mg/dL — AB (ref 65–99)
GLUCOSE-CAPILLARY: 149 mg/dL — AB (ref 65–99)
Glucose-Capillary: 123 mg/dL — ABNORMAL HIGH (ref 65–99)
Glucose-Capillary: 127 mg/dL — ABNORMAL HIGH (ref 65–99)

## 2017-01-14 LAB — CBC WITH DIFFERENTIAL/PLATELET
BASOS ABS: 0 10*3/uL (ref 0.0–0.1)
Basophils Relative: 0 %
EOS ABS: 0 10*3/uL (ref 0.0–0.7)
Eosinophils Relative: 0 %
HEMATOCRIT: 41.7 % (ref 39.0–52.0)
Hemoglobin: 13.5 g/dL (ref 13.0–17.0)
LYMPHS ABS: 1.6 10*3/uL (ref 0.7–4.0)
LYMPHS PCT: 6 %
MCH: 32.8 pg (ref 26.0–34.0)
MCHC: 32.4 g/dL (ref 30.0–36.0)
MCV: 101.2 fL — ABNORMAL HIGH (ref 78.0–100.0)
Monocytes Absolute: 1.3 10*3/uL — ABNORMAL HIGH (ref 0.1–1.0)
Monocytes Relative: 5 %
NEUTROS ABS: 23.8 10*3/uL — AB (ref 1.7–7.7)
Neutrophils Relative %: 89 %
PLATELETS: 241 10*3/uL (ref 150–400)
RBC: 4.12 MIL/uL — ABNORMAL LOW (ref 4.22–5.81)
RDW: 14.9 % (ref 11.5–15.5)
WBC: 26.7 10*3/uL — ABNORMAL HIGH (ref 4.0–10.5)

## 2017-01-14 LAB — COMPREHENSIVE METABOLIC PANEL
ALT: 99 U/L — ABNORMAL HIGH (ref 17–63)
ANION GAP: 9 (ref 5–15)
AST: 61 U/L — ABNORMAL HIGH (ref 15–41)
Albumin: 3.3 g/dL — ABNORMAL LOW (ref 3.5–5.0)
Alkaline Phosphatase: 52 U/L (ref 38–126)
BUN: 18 mg/dL (ref 6–20)
CHLORIDE: 106 mmol/L (ref 101–111)
CO2: 22 mmol/L (ref 22–32)
Calcium: 8.7 mg/dL — ABNORMAL LOW (ref 8.9–10.3)
Creatinine, Ser: 1.1 mg/dL (ref 0.61–1.24)
GFR calc non Af Amer: >60 mL/min (ref >60)
Glucose, Bld: 224 mg/dL — ABNORMAL HIGH (ref 65–99)
POTASSIUM: 3.8 mmol/L (ref 3.5–5.1)
SODIUM: 137 mmol/L (ref 135–145)
Total Bilirubin: 0.2 mg/dL — ABNORMAL LOW (ref 0.3–1.2)
Total Protein: 6 g/dL — ABNORMAL LOW (ref 6.5–8.1)

## 2017-01-14 LAB — HEMOGLOBIN A1C
Hgb A1c MFr Bld: 5.7 % — ABNORMAL HIGH (ref 4.8–5.6)
MEAN PLASMA GLUCOSE: 117 mg/dL

## 2017-01-14 LAB — PROTIME-INR
INR: 1.04
Prothrombin Time: 13.6 seconds (ref 11.4–15.2)

## 2017-01-14 LAB — AMMONIA: AMMONIA: 56 umol/L — AB (ref 9–35)

## 2017-01-14 LAB — HIV ANTIBODY (ROUTINE TESTING W REFLEX): HIV SCREEN 4TH GENERATION: NONREACTIVE

## 2017-01-14 MED ORDER — HYDROCOD POLST-CPM POLST ER 10-8 MG/5ML PO SUER
5.0000 mL | Freq: Two times a day (BID) | ORAL | Status: DC
  Administered 2017-01-14 – 2017-01-16 (×5): 5 mL via ORAL
  Filled 2017-01-14 (×6): qty 5

## 2017-01-14 MED ORDER — GUAIFENESIN-DM 100-10 MG/5ML PO SYRP
5.0000 mL | ORAL_SOLUTION | ORAL | Status: DC | PRN
  Administered 2017-01-14 – 2017-01-16 (×6): 5 mL via ORAL
  Filled 2017-01-14 (×6): qty 5

## 2017-01-14 MED ORDER — HYDROCOD POLST-CPM POLST ER 10-8 MG/5ML PO SUER
5.0000 mL | Freq: Once | ORAL | Status: AC
  Administered 2017-01-14: 5 mL via ORAL
  Filled 2017-01-14: qty 5

## 2017-01-14 MED ORDER — IPRATROPIUM-ALBUTEROL 0.5-2.5 (3) MG/3ML IN SOLN
3.0000 mL | Freq: Three times a day (TID) | RESPIRATORY_TRACT | Status: DC
  Administered 2017-01-14 – 2017-01-20 (×16): 3 mL via RESPIRATORY_TRACT
  Filled 2017-01-14 (×19): qty 3

## 2017-01-14 MED ORDER — ALUM & MAG HYDROXIDE-SIMETH 200-200-20 MG/5ML PO SUSP
30.0000 mL | Freq: Four times a day (QID) | ORAL | Status: DC | PRN
  Administered 2017-01-14: 30 mL via ORAL
  Filled 2017-01-14: qty 30

## 2017-01-14 MED ORDER — BISACODYL 10 MG RE SUPP
10.0000 mg | Freq: Once | RECTAL | Status: AC
  Administered 2017-01-14: 10 mg via RECTAL
  Filled 2017-01-14: qty 1

## 2017-01-14 NOTE — Progress Notes (Signed)
Nutrition Brief Note  Patient identified on the Malnutrition Screening Tool (MST) Report  Wt Readings from Last 15 Encounters:  01/14/17 286 lb 12.8 oz (130.1 kg)  01/04/15 270 lb (122.5 kg)  06/19/14 280 lb (127 kg)  06/07/14 266 lb (120.7 kg)  04/18/14 279 lb 12.2 oz (126.9 kg)    Body mass index is 43.61 kg/m. Patient meets criteria for morbid obesity based on current BMI. Pt would benefit from weight loss  Current diet order is Regular, patient is consuming approximately 75% of meals at this time. Pt reports good appetite, eating well PTA as well. Labs and medications reviewed.   No nutrition interventions warranted at this time. If nutrition issues arise, please consult RD.   Kerman Passey MS, RD, LDN [phone] Pager  [phone] Weekend/On-Call Pager

## 2017-01-14 NOTE — Evaluation (Signed)
Occupational Therapy Evaluation Patient Details Name: Jeremy Mcgee MRN: [id number] DOB: Sep 29, 1965 Today's Date: 01/14/2017    History of Present Illness Pt is a 51 yo male with c/o of SoB over increasing over the past week. Pt also experiencing 7/10 LBP that radiates into his LE L>R. PMH significant for COPD with chronic hypoxic respiratory failure, alcoholic cirrhosis without ascites, remote alcohol abuse, coronary artery disease, hypertension   Clinical Impression   Pt currently demonstrates the need for min assist for basic selfcare tasks and functional transfers.  Increased back pain and nausea limited participation but pt overall needing min assist for balance during grooming tasks and ambulation to the sink with use of his single point cane.  Decreased efficiency and flexibility as well for LB selfcare tasks.  Feel he will benefit from acute care OT to help increase independence but feel longer rehab at SNF level will be best in order to reach modified independent level.  Will continue to follow for acute care OT needs.     Follow Up Recommendations  SNF;Supervision/Assistance - 24 hour    Equipment Recommendations  None recommended by OT       Precautions / Restrictions Precautions Precautions: Fall Precaution Comments: Pt has fallen 6 times in the last 6 months, and after the last fall has developed a constant UE tremor R>L.  Restrictions Weight Bearing Restrictions: No      Mobility Bed Mobility                  Transfers Overall transfer level: Needs assistance Equipment used: Straight cane Transfers: Sit to/from Stand Sit to Stand: Min assist              Balance Overall balance assessment: Needs assistance Sitting-balance support: No upper extremity supported Sitting balance-Leahy Scale: Fair       Standing balance-Leahy Scale: Poor Standing balance comment: Required min assist for standing balance using the single point cane during mobility from  bed to sink.                            ADL either performed or assessed with clinical judgement   ADL Overall ADL's : Needs assistance/impaired Eating/Feeding: Independent   Grooming: Wash/dry hands;Minimal assistance;Standing   Upper Body Bathing: Supervision/ safety;Sitting   Lower Body Bathing: Minimal assistance;Sit to/from stand   Upper Body Dressing : Sitting;Supervision/safety   Lower Body Dressing: Sit to/from stand;Minimal assistance   Toilet Transfer: Minimal assistance;Ambulation Toilet Transfer Details (indicate cue type and reason): Pt with simulated transfer using single point cane Toileting- Clothing Manipulation and Hygiene: Minimal assistance       Functional mobility during ADLs: Minimal assistance;Cane General ADL Comments: Pt with limited mobility secondary to increased back pain, just to the sink and then back to bed.  Noted pt coughing up ice cream at start of session, concerned that he had thrown up his previous medication.  Nursing made aware.  Pt anxious about finding out what is going on with his back and determining what is causing him pain and his legs buckling.  Will continue to assess in treatment.      Vision Baseline Vision/History: Wears glasses Wears Glasses: At all times Patient Visual Report: No change from baseline Vision Assessment?: No apparent visual deficits     Perception     Praxis      Pertinent Vitals/Pain Pain Assessment: Faces Faces Pain Scale: Hurts little more Pain Location: low back pain Pain  Descriptors / Indicators: Aching;Discomfort Pain Intervention(s): Repositioned;Limited activity within patient's tolerance;Monitored during session     Hand Dominance Right   Extremity/Trunk Assessment Upper Extremity Assessment Upper Extremity Assessment: RUE deficits/detail;LUE deficits/detail RUE Deficits / Details: shoulder flexion AROM 0-90 degrees with report of back pain.  Full AROM in all other joints but  strength only 3/5.  Pt also with noted tremor in the right hand as well LUE Deficits / Details: AROM shoulder flexion 0-90 degrees with pain during attempted AAROM further than this.  Elbow AROM and grip AROM WFLs at this time.  strength 3/5 throughout   Lower Extremity Assessment Lower Extremity Assessment: Defer to PT evaluation   Cervical / Trunk Assessment Cervical / Trunk Assessment: Kyphotic   Communication Communication Communication: No difficulties   Cognition Arousal/Alertness: Awake/alert Behavior During Therapy: WFL for tasks assessed/performed Overall Cognitive Status: Within Functional Limits for tasks assessed                                                Home Living Family/patient expects to be discharged to:: Private residence Living Arrangements: Other relatives (Brother and cousin ) Available Help at Discharge: Available 24 hours/day;Family Type of Home: Mobile home Home Access: Ramped entrance     Home Layout: One level     Bathroom Shower/Tub: Occupational psychologist: Handicapped height Bathroom Accessibility: Yes   Home Equipment: Shower seat;Cane - single point;Walker - 4 wheels;Grab bars - toilet          Prior Functioning/Environment Level of Independence: Needs assistance  Gait / Transfers Assistance Needed: independent for household distances with RW with hx of falling ADL's / Homemaking Assistance Needed: brother bathes and dresses             OT Problem List: Decreased strength;Pain      OT Treatment/Interventions: Self-care/ADL training;DME and/or AE instruction;Therapeutic activities;Balance training;Patient/family education    OT Goals(Current goals can be found in the care plan section) Acute Rehab OT Goals Patient Stated Goal: Get better OT Goal Formulation: With patient Time For Goal Achievement: 01/28/17 Potential to Achieve Goals: Fair  OT Frequency: Min 2X/week   Barriers to D/C: Decreased  caregiver support             AM-PAC PT "6 Clicks" Daily Activity     Outcome Measure Help from another person eating meals?: None Help from another person taking care of personal grooming?: A Little Help from another person toileting, which includes using toliet, bedpan, or urinal?: A Little Help from another person bathing (including washing, rinsing, drying)?: A Little Help from another person to put on and taking off regular upper body clothing?: None Help from another person to put on and taking off regular lower body clothing?: A Little 6 Click Score: 20   End of Session Equipment Utilized During Treatment: Oxygen Nurse Communication: Mobility status  Activity Tolerance: Patient limited by pain Patient left: in bed;with call bell/phone within reach;with bed alarm set  OT Visit Diagnosis: Unsteadiness on feet (R26.81);Muscle weakness (generalized) (M62.81);Repeated falls (R29.6);Pain;History of falling (Z91.81) Pain - part of body:  (back)                Time: 1351-1409 OT Time Calculation (min): 18 min Charges:  OT General Charges $OT Visit: 1 Procedure OT Evaluation $OT Eval Moderate Complexity: 1 Procedure  Derak Schurman OTR/L 01/14/2017,  3:12 PM

## 2017-01-14 NOTE — Progress Notes (Addendum)
   Offered prayer.  Patient expressed concerns regarding potential lack of safe and stable living arrangements post-discharge.   Will follow, needed.

## 2017-01-14 NOTE — Progress Notes (Signed)
Pt refused to put the bed alarm on.

## 2017-01-14 NOTE — Progress Notes (Signed)
PROGRESS NOTE                                                                                                                                                                                                             Patient Demographics:    Jeremy Mcgee, is a 51 y.o. male, DOB - [birth year], DPO:[id number]  Admit date - 01/13/2017   Admitting Physician Vianne Bulls, MD  Outpatient Primary MD for the patient is Patient, No Pcp Per  LOS - 1  Outpatient Specialists: Dr Byrd Hesselbach , orthopedic surgeon in [place]  No chief complaint on file.      Brief Narrative   51 year old morbidly obese male with COPD with chronic hypoxic respiratory failure, alcoholic cirrhosis without ascites (no active alcohol use), coronary artery disease, hypertension, chronic low back pain with radiculopathy, bilateral knee pain was sent from [facility] where he was admitted on 6/17 with acute exacerbation of COPD and worsening chronic back pain with recurrent falls over the past few months due to bilateral lower extremity weakness.  Patient reports chronic low back pain for many years and was following with Dr. Pascal Lux who is an orthopedic surgeon in [place]. He had MRI of the lumbar spine done in early 2017 showing lateral recess narrowing of left L2-L3 and left foraminal stenosis of L4-L5. He has been treated with pain medications and multiple steroid injections. He also follows with pain management and is being treated with long-acting morphine and oxycodone IR. He informs his lower extremity weakness to be bilateral and present for past 3-4 months. He has experienced buckling of his knees causing multiple falls. No head injury or loss of consciousness. He informs the pain radiates down to his legs but denies any bowel or urinary incontinence/retention. Patient was last seen by his orthopedics in March when he received steroid injection to  the knees. He was planned to have MRI of his back and knees but due to severe claustrophobia this was not possible.  Patient was then admitted to [facility] for acute exacerbation of COPD and ongoing severe low back pain with bilateral leg weakness. Blood work done showed leukocytosis. B12 was normal. Patient was being treated with Solu-Medrol and nebs along with empiric antibiotics. Patient was unable to tolerate MRI of his low back due to severe claustrophobia. Since he would need an open  MRI or done under sedation followed by possible neurosurgical consultation he was transferred to [facility].    Subjective:   Patient tearful complaining of back pain and having lower extremity weakness. Also having cough with wheezing.  Assessment  & Plan :    Principal Problem:   COPD With acute exacerbation ([place]) Has chronic hypoxic respiratory failure and centrilobular emphysema. On 2-3 L oxygen at home. Continue IV Solu-Medrol, will scheduled nebs along with empiric Rocephin and azithromycin. Continue supplemental oxygen, supportive care with Tylenol and antitussives. Patient smokes half pack per day and plans to quit. Counseled on cessation. Order nicotine patch.  Active Problems: Chronic low back pain with bilateral lower extremity weakness Suspect lumbar radiculopathy prior MRI findings of lumbar stenosis. Patient reports multiple falls at home and is very weak on PT evaluation. No clinical signs of cord compression. -We will attempt MRI of the lumbar spine under conscious sedation. -His home pain medications have been resumed which include MS Contin and OxyIR. Also added when necessary Dilaudid and Valium when necessary for muscle spasms. Started on B12 supplement for low level.    CAD (coronary artery disease) Continue aspirin     Alcoholic cirrhosis of liver without ascites, ([place]) Has transaminitis. Unclear if this is acute versus chronic. Tylenol and salicylate levels are normal.  Ammonia level elevated. INR normal. Check abdominal ultrasound.    Hyperglycemia Secondary to steroid use. Monitor on sliding scale coverage.  Essential hypertension. Stable. Continue Cardizem and lisinopril.   Morbid obesity with? OHS Needs counseling on diet and exercise. Recommend outpatient stay study.  Leukocytosis   possibly due to steroid. No signs of infection.  Vitamin B12 deficiency Added supplement.  Code Status : Full code  Family Communication  : None at bedside  Disposition Plan  : Pending hospital course  Barriers For Discharge : Active symptoms  Consults  :  None  Procedures  : None  DVT Prophylaxis  :  Lovenox   Lab Results  Component Value Date   PLT 241 01/14/2017    Antibiotics  :    Anti-infectives    Start     Dose/Rate Route Frequency Ordered Stop   01/14/17 2000  azithromycin (ZITHROMAX) 500 mg in dextrose 5 % 250 mL IVPB     500 mg 250 mL/hr over 60 Minutes Intravenous Every 24 hours 01/13/17 2340     01/14/17 1800  cefTRIAXone (ROCEPHIN) 1 g in dextrose 5 % 50 mL IVPB    Comments:  Tolerating Rocephin at [facility] prior to transfer   1 g 100 mL/hr over 30 Minutes Intravenous Every 24 hours 01/13/17 2340          Objective:   Vitals:   01/14/17 0802 01/14/17 0830 01/14/17 0831 01/14/17 1100  BP: (!) 140/95   128/88  Pulse: 87   96  Resp: 20   20  Temp: 97.9 F (36.6 C)   98.1 F (36.7 C)  TempSrc: Oral   Oral  SpO2: 96% 96% 96% 95%  Weight:      Height:        Wt Readings from Last 3 Encounters:  01/14/17 130.1 kg (286 lb 12.8 oz)  01/04/15 122.5 kg (270 lb)  06/19/14 127 kg (280 lb)     Intake/Output Summary (Last 24 hours) at 01/14/17 1313 Last data filed at 01/14/17 1134  Gross per 24 hour  Intake              720 ml  Output  1350 ml  Net             -630 ml     Physical Exam  Gen: Morbidly obese male, not in distress, tearful HEENT:  moist mucosa, supple neck Chest: Scattered wheezing  bilaterally CVS: N S1&S2, no murmurs, rubs or gallop GI: soft, NT, ND,  Musculoskeletal: warm, no edema, tenderness on pressure over lumbar vertebral area CNS: Alert and oriented, normal tone, sensation and power in lower extremity    Data Review:    CBC  Recent Labs Lab 01/14/17 0039  WBC 26.7*  HGB 13.5  HCT 41.7  PLT 241  MCV 101.2*  MCH 32.8  MCHC 32.4  RDW 14.9  LYMPHSABS 1.6  MONOABS 1.3*  EOSABS 0.0  BASOSABS 0.0    Chemistries   Recent Labs Lab 01/14/17 0039  NA 137  K 3.8  CL 106  CO2 22  GLUCOSE 224*  BUN 18  CREATININE 1.10  CALCIUM 8.7*  AST 61*  ALT 99*  ALKPHOS 52  BILITOT 0.2*   ------------------------------------------------------------------------------------------------------------------ No results for input(s): CHOL, HDL, LDLCALC, TRIG, CHOLHDL, LDLDIRECT in the last 72 hours.  Lab Results  Component Value Date   HGBA1C 5.8 (H) 04/15/2014   ------------------------------------------------------------------------------------------------------------------ No results for input(s): TSH, T4TOTAL, T3FREE, THYROIDAB in the last 72 hours.  Invalid input(s): FREET3 ------------------------------------------------------------------------------------------------------------------  Recent Labs  01/14/17 0810  VITAMINB12 335    Coagulation profile  Recent Labs Lab 01/14/17 0039  INR 1.04    No results for input(s): DDIMER in the last 72 hours.  Cardiac Enzymes No results for input(s): CKMB, TROPONINI, MYOGLOBIN in the last 168 hours.  Invalid input(s): CK ------------------------------------------------------------------------------------------------------------------ No results found for: BNP  Inpatient Medications  Scheduled Meds: . aspirin EC  325 mg Oral Daily  . atenolol  50 mg Oral Daily  . chlorpheniramine-HYDROcodone  5 mL Oral Q12H  . diltiazem  240 mg Oral Daily  . enoxaparin (LOVENOX) injection  40 mg  Subcutaneous QHS  . insulin aspart  0-9 Units Subcutaneous TID WC  . lisinopril  20 mg Oral Daily  . Melatonin  4.5 mg Oral QHS  . methylPREDNISolone (SOLU-MEDROL) injection  60 mg Intravenous Q6H  . mometasone-formoterol  2 puff Inhalation BID  . morphine  30 mg Oral Q12H  . multivitamin with minerals  1 tablet Oral Daily  . naproxen  250 mg Oral BID WC  . pantoprazole  40 mg Oral BID AC  . sodium chloride flush  3 mL Intravenous Q12H  . vitamin B-12  1,000 mcg Oral Daily   Continuous Infusions: . sodium chloride    . azithromycin    . cefTRIAXone (ROCEPHIN)  IV     PRN Meds:.sodium chloride, acetaminophen **OR** acetaminophen, alum & mag hydroxide-simeth, bisacodyl, diazepam, diphenhydrAMINE, guaiFENesin-dextromethorphan, HYDROmorphone, ipratropium-albuterol, ondansetron **OR** ondansetron (ZOFRAN) IV, oxyCODONE, polyethylene glycol, sodium chloride flush  Micro Results No results found for this or any previous visit (from the past 240 hour(s)).  Radiology Reports Dg Chest Port 1 View  Result Date: 01/14/2017 CLINICAL DATA:  Acute onset of shortness of breath and wheezing. Initial encounter. EXAM: PORTABLE CHEST 1 VIEW COMPARISON:  Chest radiograph performed 01/12/2017 FINDINGS: The lungs are well-aerated. Mild bibasilar airspace opacities raise concern for pneumonia. There is no evidence of pleural effusion or pneumothorax. The cardiomediastinal silhouette is within normal limits. No acute osseous abnormalities are seen. IMPRESSION: Mild bibasilar airspace opacities raise concern for pneumonia. Electronically Signed   By: Garald Balding M.D.   On: 01/14/2017  00:56    Time Spent in minutes  35   Louellen Molder M.D on 01/14/2017 at 1:13 PM  Between 7am to 7pm - Pager - [phone]  After 7pm go to [email or web address] - password [facility]  [facility] -  Office  [phone]

## 2017-01-14 NOTE — Progress Notes (Signed)
CM following for DCP; noted physical therapy recommendations for SNF placement at discharge; Patient transferred from [facility] for continuation of care; CM will continue to follow for DCP; B Pennie Rushing [phone]

## 2017-01-14 NOTE — Evaluation (Signed)
Physical Therapy Evaluation Patient Details Name: Jeremy Mcgee MRN: [id number] DOB: [birth year] Today's Date: 01/14/2017   History of Present Illness  Pt is a 51 yo male with c/o of SoB over increasing over the past week. Pt also experiencing 7/10 LBP that radiates into his LE L>R. PMH significant for COPD with chronic hypoxic respiratory failure, alcoholic cirrhosis without ascites, remote alcohol abuse, coronary artery disease, hypertension  Clinical Impression  Pt mobility is limited by decreased LE strength and ROM possibly secondary to radicular pain associated with his LBP. Pt currently, minA for transfers and minA for gait of 15 feet with modA required for 1xLoB experienced during ambulation. Pt reports his "knees buckle" often resulting in falls. Pt will benefit from skilled PT to increase their independence and safety with mobility to allow discharge to the venue listed below.       Follow Up Recommendations SNF    Equipment Recommendations  None recommended by PT    Recommendations for Other Services OT consult     Precautions / Restrictions Precautions Precautions: Fall Precaution Comments: Pt has fallen 6 times in the last 6 months, and after the last fall has developed a constant UE tremor R>L.  Restrictions Weight Bearing Restrictions: No      Mobility  Bed Mobility               General bed mobility comments: seated EoB on entry  Transfers Overall transfer level: Needs assistance Equipment used: Rolling walker (2 wheeled) Transfers: Sit to/from Stand Sit to Stand: Min assist         General transfer comment: minA for power up and steadying in upright required 2 attempts to get to upright, vc for hand placement for power up  Ambulation/Gait Ambulation/Gait assistance: Min assist;Mod assist Ambulation Distance (Feet): 15 Feet Assistive device: Rolling walker (2 wheeled) Gait Pattern/deviations: Step-through pattern;Shuffle;Decreased step length -  right;Decreased step length - left;Decreased weight shift to left;Antalgic;Trunk flexed Gait velocity: slowed Gait velocity interpretation: Below normal speed for age/gender General Gait Details: minA for steadying, modA for recovery on 1xLoB with gait, verbal and tactile cues for staying inside the walker and looking up and out.       Balance Overall balance assessment: History of Falls;Needs assistance Sitting-balance support: Bilateral upper extremity supported;Feet supported;Single extremity supported;No upper extremity supported Sitting balance-Leahy Scale: Fair Sitting balance - Comments: seated EoB eating breakfast with no problem   Standing balance support: Bilateral upper extremity supported Standing balance-Leahy Scale: Poor Standing balance comment: requires minA for steadying in RW                             Pertinent Vitals/Pain Pain Assessment: 0-10 Pain Score: 7  Pain Location: low back radiating down legs Pain Descriptors / Indicators: Aching;Constant;Radiating;Pins and needles;Throbbing Pain Intervention(s): Monitored during session;Repositioned    Home Living Family/patient expects to be discharged to:: Private residence Living Arrangements: Other relatives (Brother and cousin ) Available Help at Discharge: Available 24 hours/day;Family Type of Home: Mobile home Home Access: Ramped entrance     Home Layout: One level Home Equipment: Shower seat;Cane - single point;Walker - 4 wheels;Grab bars - toilet      Prior Function Level of Independence: Needs assistance   Gait / Transfers Assistance Needed: independent for household distances with RW with hx of falling  ADL's / Homemaking Assistance Needed: brother bathes and dresses         Hand Dominance  Dominant Hand: Right    Extremity/Trunk Assessment   Upper Extremity Assessment Upper Extremity Assessment: Defer to OT evaluation    Lower Extremity Assessment Lower Extremity  Assessment: RLE deficits/detail;LLE deficits/detail RLE Deficits / Details: MMT- hip 3/5, knee ext 3/5, knee flex 3/5, plantar flex 3/5. dorsiflex painful unable to assess, ROM of all joints decreased RLE: Unable to fully assess due to pain RLE Sensation: decreased light touch LLE Deficits / Details: MMT- hip -3/5, knee ext 3/5, knee flex -3/5, plantar and dorsiflexion 3/5. ROM of all joints decreased LLE Sensation: decreased light touch    Cervical / Trunk Assessment Cervical / Trunk Assessment: Kyphotic  Communication   Communication: No difficulties  Cognition Arousal/Alertness: Awake/alert Behavior During Therapy: WFL for tasks assessed/performed Overall Cognitive Status: Within Functional Limits for tasks assessed                                        General Comments General comments (skin integrity, edema, etc.): Pt on 2L O2 via nasal cannula SaO2 at rest and after activity both 90% O2,          Assessment/Plan    PT Assessment Patient needs continued PT services  PT Problem List Decreased strength;Decreased range of motion;Decreased activity tolerance;Decreased balance;Decreased mobility;Decreased knowledge of use of DME;Decreased safety awareness;Impaired sensation;Obesity;Pain       PT Treatment Interventions Gait training;DME instruction;Functional mobility training;Therapeutic activities;Therapeutic exercise;Balance training;Patient/family education    PT Goals (Current goals can be found in the Care Plan section)  Acute Rehab PT Goals Patient Stated Goal: go somewhere with less stress because my family has to care for me PT Goal Formulation: With patient Time For Goal Achievement: 01/28/17 Potential to Achieve Goals: Fair    Frequency Min 2X/week    AM-PAC PT "6 Clicks" Daily Activity  Outcome Measure Difficulty turning over in bed (including adjusting bedclothes, sheets and blankets)?: A Lot Difficulty moving from lying on back to sitting  on the side of the bed? : A Lot Difficulty sitting down on and standing up from a chair with arms (e.g., wheelchair, bedside commode, etc,.)?: Total Help needed moving to and from a bed to chair (including a wheelchair)?: A Little Help needed walking in hospital room?: A Little Help needed climbing 3-5 steps with a railing? : Total 6 Click Score: 12    End of Session Equipment Utilized During Treatment: Gait belt;Oxygen Activity Tolerance: Patient limited by fatigue;Patient limited by pain Patient left: in chair;with call bell/phone within reach;with nursing/sitter in room;with chair alarm set Nurse Communication: Mobility status;Patient requests pain meds PT Visit Diagnosis: Unsteadiness on feet (R26.81);Repeated falls (R29.6);Other abnormalities of gait and mobility (R26.89);Muscle weakness (generalized) (M62.81);History of falling (Z91.81);Difficulty in walking, not elsewhere classified (R26.2);Other symptoms and signs involving the nervous system (R29.898);Pain Pain - Right/Left:  (bilateral) Pain - part of body: Leg (back)    Time: 7989-2119 PT Time Calculation (min) (ACUTE ONLY): 34 min   Charges:   PT Evaluation $PT Eval Moderate Complexity: 1 Procedure PT Treatments $Therapeutic Activity: 8-22 mins   PT G Codes:        Venus Ruhe B. Migdalia Dk PT, DPT Acute Rehabilitation  [phone] Pager [phone]   [place] 01/14/2017, 9:38 AM

## 2017-01-14 NOTE — Clinical Social Work Note (Signed)
CSW met with patient to discuss insurance and SNF placement. Assessment to follow. Patient's chart states that he is only Medicaid pending. Patient stated that this is not the case and handed CSW a Medicare, Medicaid, and [facility] card. He also stated that he will have [employer] on July 1st. CSW left voicemail for financial counselor, Shanon Rosser.  Medicare: [id number] Medicaid: [id number] Dorthula Rue Medicare: [place] ([id number]), Issuer [id number])  [place]ton Scrape, [place]

## 2017-01-15 DIAGNOSIS — M5416 Radiculopathy, lumbar region: Secondary | ICD-10-CM

## 2017-01-15 LAB — GLUCOSE, CAPILLARY
GLUCOSE-CAPILLARY: 102 mg/dL — AB (ref 65–99)
GLUCOSE-CAPILLARY: 146 mg/dL — AB (ref 65–99)
Glucose-Capillary: 138 mg/dL — ABNORMAL HIGH (ref 65–99)
Glucose-Capillary: 123 mg/dL — ABNORMAL HIGH (ref 65–99)

## 2017-01-15 LAB — COMPREHENSIVE METABOLIC PANEL
ALBUMIN: 3.9 g/dL (ref 3.5–5.0)
ALK PHOS: 53 U/L (ref 38–126)
ALT: 104 U/L — AB (ref 17–63)
AST: 63 U/L — ABNORMAL HIGH (ref 15–41)
Anion gap: 11 (ref 5–15)
BUN: 26 mg/dL — ABNORMAL HIGH (ref 6–20)
CALCIUM: 9.5 mg/dL (ref 8.9–10.3)
CO2: 23 mmol/L (ref 22–32)
CREATININE: 1.18 mg/dL (ref 0.61–1.24)
Chloride: 103 mmol/L (ref 101–111)
GFR calc Af Amer: >60 mL/min (ref >60)
GFR calc non Af Amer: >60 mL/min (ref >60)
GLUCOSE: 122 mg/dL — AB (ref 65–99)
Potassium: 4.4 mmol/L (ref 3.5–5.1)
SODIUM: 137 mmol/L (ref 135–145)
Total Bilirubin: 0.5 mg/dL (ref 0.3–1.2)
Total Protein: 7 g/dL (ref 6.5–8.1)

## 2017-01-15 LAB — CBC
HCT: 45.3 % (ref 39.0–52.0)
HEMOGLOBIN: 14.9 g/dL (ref 13.0–17.0)
MCH: 33.4 pg (ref 26.0–34.0)
MCHC: 32.9 g/dL (ref 30.0–36.0)
MCV: 101.6 fL — ABNORMAL HIGH (ref 78.0–100.0)
Platelets: 255 10*3/uL (ref 150–400)
RBC: 4.46 MIL/uL (ref 4.22–5.81)
RDW: 15.1 % (ref 11.5–15.5)
WBC: 27.8 10*3/uL — ABNORMAL HIGH (ref 4.0–10.5)

## 2017-01-15 MED ORDER — FLEET ENEMA 7-19 GM/118ML RE ENEM
1.0000 | ENEMA | Freq: Once | RECTAL | Status: AC
  Administered 2017-01-15: 1 via RECTAL
  Filled 2017-01-15: qty 1

## 2017-01-15 MED ORDER — INSULIN ASPART 100 UNIT/ML ~~LOC~~ SOLN: 0.0000 [IU] | Freq: Three times a day (TID) | SUBCUTANEOUS | Status: DC

## 2017-01-15 MED ORDER — INSULIN ASPART 100 UNIT/ML ~~LOC~~ SOLN
0.0000 [IU] | Freq: Three times a day (TID) | SUBCUTANEOUS | Status: DC
  Administered 2017-01-16 – 2017-01-17 (×2): 2 [IU] via SUBCUTANEOUS
  Administered 2017-01-18 (×2): 3 [IU] via SUBCUTANEOUS
  Administered 2017-01-18 – 2017-01-19 (×2): 2 [IU] via SUBCUTANEOUS
  Administered 2017-01-20: 1 [IU] via SUBCUTANEOUS

## 2017-01-15 MED ORDER — METHYLPREDNISOLONE SODIUM SUCC 125 MG IJ SOLR
60.0000 mg | Freq: Two times a day (BID) | INTRAMUSCULAR | Status: DC
  Administered 2017-01-15 – 2017-01-18 (×7): 60 mg via INTRAVENOUS
  Filled 2017-01-15 (×7): qty 2

## 2017-01-15 MED ORDER — KETOROLAC TROMETHAMINE 30 MG/ML IJ SOLN
30.0000 mg | Freq: Three times a day (TID) | INTRAMUSCULAR | Status: DC
  Administered 2017-01-15 – 2017-01-16 (×3): 30 mg via INTRAVENOUS
  Filled 2017-01-15 (×3): qty 1

## 2017-01-15 MED ORDER — FUROSEMIDE 10 MG/ML IJ SOLN
40.0000 mg | Freq: Once | INTRAMUSCULAR | Status: AC
  Administered 2017-01-15: 40 mg via INTRAVENOUS
  Filled 2017-01-15: qty 4

## 2017-01-15 MED FILL — Albuterol Sulfate Soln Nebu 0.083% (2.5 MG/3ML): RESPIRATORY_TRACT | Qty: 6 | Status: AC

## 2017-01-15 NOTE — Progress Notes (Signed)
Pt refusing bed alarm. Will continue to monitor. 

## 2017-01-15 NOTE — Progress Notes (Signed)
PROGRESS NOTE                                                                                                                                                                                                             Patient Demographics:    Jeremy Mcgee, is a 51 y.o. male, DOB - 1965/09/07, PRF:[id number]  Admit date - 01/13/2017   Admitting Physician Vianne Bulls, MD  Outpatient Primary MD for the patient is Patient, No Pcp Per  LOS - 2  Outpatient Specialists: Dr Byrd Hesselbach , orthopedic surgeon in [place]  No chief complaint on file.      Brief Narrative   51 year old morbidly obese male with COPD with chronic hypoxic respiratory failure, alcoholic cirrhosis without ascites (no active alcohol use), coronary artery disease, hypertension, chronic low back pain with radiculopathy, bilateral knee pain was sent from [facility] where he was admitted on 6/17 with acute exacerbation of COPD and worsening chronic back pain with recurrent falls over the past few months due to bilateral lower extremity weakness.  Patient reports chronic low back pain for many years and was following with Dr. Pascal Lux who is an orthopedic surgeon in [place]. He had MRI of the lumbar spine done in early 2017 showing lateral recess narrowing of left L2-L3 and left foraminal stenosis of L4-L5. He has been treated with pain medications and multiple steroid injections. He also follows with pain management and is being treated with long-acting morphine and oxycodone IR. He informs his lower extremity weakness to be bilateral and present for past 3-4 months. He has experienced buckling of his knees causing multiple falls. No head injury or loss of consciousness. He informs the pain radiates down to his legs but denies any bowel or urinary incontinence/retention. Patient was last seen by his orthopedics in March when he received steroid injection to  the knees. He was planned to have MRI of his back and knees but due to severe claustrophobia this was not possible.  Patient was then admitted to [facility] for acute exacerbation of COPD and ongoing severe low back pain with bilateral leg weakness. Blood work done showed leukocytosis. B12 was normal. Patient was being treated with Solu-Medrol and nebs along with empiric antibiotics. Patient was unable to tolerate MRI of his low back due to severe claustrophobia. Since he would need an open  MRI or done under sedation followed by possible neurosurgical consultation he was transferred to [facility].    Subjective:   Still c/o low back pain, dyspnea better but cough persists. Tearful during conversation  Assessment  & Plan :    Principal Problem:   COPD With acute exacerbation ([place]) Has chronic hypoxic respiratory failure and centrilobular emphysema. On 2-3 L oxygen at home. Reduce IV solumedrol dose, schedule nebs along with empiric Rocephin and azithromycin. Continue supplemental oxygen, supportive care with Tylenol and antitussives.  Counseled on smoking cessation.  nicotine patch.  Active Problems: Chronic low back pain with bilateral lower extremity weakness Suspect lumbar radiculopathy . prior MRI findings of lumbar stenosis. Patient reports multiple falls at home and is very weak on PT evaluation. No clinical signs of cord compression. - plan MRI of the lumbar spine under conscious sedation. -His home pain medications have been resumed which include MS Contin and OxyIR. Also added when necessary Dilaudid . Added toradol today. Pt asking for pain meds frequently. Started on B12 supplement for low level.    CAD (coronary artery disease) Continue aspirin     Alcoholic cirrhosis of liver without ascites, ([place]) Has transaminitis which seems chronic ( noted on prior labs).  Tylenol and [place] levels are normal. Ammonia level elevated. INR normal. Check abdominal ultrasound done at  outside hospital shows chr hepatocellular disease. LFTs improving.    Hyperglycemia with prediabetes Secondary to steroid use. Monitor on sliding scale coverage. A1C of  5.7 .  Essential hypertension. Stable. Continue Cardizem and lisinopril.   Morbid obesity with? OHS Needs counseling on diet and exercise. Recommend outpatient sleep study.  Leukocytosis   possibly due to steroid. No signs of infection.  Vitamin B12 deficiency Added supplement.  Code Status : Full code  Family Communication  : None at bedside  Disposition Plan  : Pending hospital course  Barriers For Discharge : Active symptoms  Consults  :  None  Procedures  : None  DVT Prophylaxis  :  Lovenox   Lab Results  Component Value Date   PLT 255 01/15/2017    Antibiotics  :    Anti-infectives    Start     Dose/Rate Route Frequency Ordered Stop   01/14/17 2000  azithromycin (ZITHROMAX) 500 mg in dextrose 5 % 250 mL IVPB     500 mg 250 mL/hr over 60 Minutes Intravenous Every 24 hours 01/13/17 2340     01/14/17 1800  cefTRIAXone (ROCEPHIN) 1 g in dextrose 5 % 50 mL IVPB    Comments:  Tolerating Rocephin at [facility] prior to transfer   1 g 100 mL/hr over 30 Minutes Intravenous Every 24 hours 01/13/17 2340          Objective:   Vitals:   01/15/17 0809 01/15/17 0822 01/15/17 1139 01/15/17 1439  BP:  (!) 154/87 115/75   Pulse:  81 74   Resp:  20 18   Temp:  97.6 F (36.4 C) 97.6 F (36.4 C)   TempSrc:  Oral Oral   SpO2: 95% 90% 90% 96%  Weight:      Height:        Wt Readings from Last 3 Encounters:  01/15/17 131.5 kg (289 lb 12.8 oz)  01/04/15 122.5 kg (270 lb)  06/19/14 127 kg (280 lb)     Intake/Output Summary (Last 24 hours) at 01/15/17 1442 Last data filed at 01/15/17 1100  Gross per 24 hour  Intake  1020 ml  Output             1100 ml  Net              -80 ml     Physical Exam Gen: morbidly obese, NAD HEENT: moist mucosa, supple neck  chest: scattered rhonchi  b/l, improved CVS: NS1&S2, no murmurs GI: soft, NT, ND Musculoskeletal: warm, no edema, lumbar spine tenderness     Data Review:    CBC  Recent Labs Lab 01/14/17 0039 01/15/17 0229  WBC 26.7* 27.8*  HGB 13.5 14.9  HCT 41.7 45.3  PLT 241 255  MCV 101.2* 101.6*  MCH 32.8 33.4  MCHC 32.4 32.9  RDW 14.9 15.1  LYMPHSABS 1.6  --   MONOABS 1.3*  --   EOSABS 0.0  --   BASOSABS 0.0  --     Chemistries   Recent Labs Lab 01/14/17 0039 01/15/17 0229  NA 137 137  K 3.8 4.4  CL 106 103  CO2 22 23  GLUCOSE 224* 122*  BUN 18 26*  CREATININE 1.10 1.18  CALCIUM 8.7* 9.5  AST 61* 63*  ALT 99* 104*  ALKPHOS 52 53  BILITOT 0.2* 0.5   ------------------------------------------------------------------------------------------------------------------ No results for input(s): CHOL, HDL, LDLCALC, TRIG, CHOLHDL, LDLDIRECT in the last 72 hours.  Lab Results  Component Value Date   HGBA1C 5.7 (H) 01/14/2017   ------------------------------------------------------------------------------------------------------------------ No results for input(s): TSH, T4TOTAL, T3FREE, THYROIDAB in the last 72 hours.  Invalid input(s): FREET3 ------------------------------------------------------------------------------------------------------------------  Recent Labs  01/14/17 0810  VITAMINB12 335    Coagulation profile  Recent Labs Lab 01/14/17 0039  INR 1.04    No results for input(s): DDIMER in the last 72 hours.  Cardiac Enzymes No results for input(s): CKMB, TROPONINI, MYOGLOBIN in the last 168 hours.  Invalid input(s): CK ------------------------------------------------------------------------------------------------------------------ No results found for: BNP  Inpatient Medications  Scheduled Meds: . aspirin EC  325 mg Oral Daily  . atenolol  50 mg Oral Daily  . chlorpheniramine-HYDROcodone  5 mL Oral Q12H  . diltiazem  240 mg Oral Daily  . enoxaparin (LOVENOX)  injection  40 mg Subcutaneous QHS  . insulin aspart  0-9 Units Subcutaneous TID WC  . ipratropium-albuterol  3 mL Nebulization TID  . ketorolac  30 mg Intravenous Q8H  . lisinopril  20 mg Oral Daily  . Melatonin  4.5 mg Oral QHS  . methylPREDNISolone (SOLU-MEDROL) injection  60 mg Intravenous Q12H  . mometasone-formoterol  2 puff Inhalation BID  . morphine  30 mg Oral Q12H  . multivitamin with minerals  1 tablet Oral Daily  . pantoprazole  40 mg Oral BID AC  . sodium chloride flush  3 mL Intravenous Q12H  . vitamin B-12  1,000 mcg Oral Daily   Continuous Infusions: . sodium chloride    . azithromycin Stopped (01/14/17 1930)  . cefTRIAXone (ROCEPHIN)  IV 1 g (01/14/17 1655)   PRN Meds:.sodium chloride, acetaminophen **OR** acetaminophen, alum & mag hydroxide-simeth, bisacodyl, diphenhydrAMINE, guaiFENesin-dextromethorphan, HYDROmorphone, ipratropium-albuterol, ondansetron **OR** ondansetron (ZOFRAN) IV, oxyCODONE, polyethylene glycol, sodium chloride flush  Micro Results No results found for this or any previous visit (from the past 240 hour(s)).  Radiology Reports Dg Chest Port 1 View  Result Date: 01/14/2017 CLINICAL DATA:  Acute onset of shortness of breath and wheezing. Initial encounter. EXAM: PORTABLE CHEST 1 VIEW COMPARISON:  Chest radiograph performed 01/12/2017 FINDINGS: The lungs are well-aerated. Mild bibasilar airspace opacities raise concern for pneumonia. There is no evidence of pleural  effusion or pneumothorax. The cardiomediastinal silhouette is within normal limits. No acute osseous abnormalities are seen. IMPRESSION: Mild bibasilar airspace opacities raise concern for pneumonia. Electronically Signed   By: Garald Balding M.D.   On: 01/14/2017 00:56    Time Spent in minutes  25   Louellen Molder M.D on 01/15/2017 at 2:42 PM  Between 7am to 7pm - Pager - [phone]  After 7pm go to [email or web address] - password [facility]  [facility] -  Office  [phone]

## 2017-01-15 NOTE — Progress Notes (Signed)
Pt anxious about MRI, updates given.

## 2017-01-15 NOTE — Clinical Social Work Note (Signed)
Clinical Social Work Assessment  Patient Details  Name: Jeremy Mcgee MRN: [id number] Date of Birth: [birth year]  Date of referral:  01/15/17               Reason for consult:  Facility Placement, Discharge Planning                Permission sought to share information with:  Facility Sport and exercise psychologist, Family Supports Permission granted to share information::  Yes, Verbal Permission Granted  Name::        Agency::  SNF's, [place]  Relationship::     Contact Information:     Housing/Transportation Living arrangements for the past 2 months:  [place] of Information:  Patient, Medical Team Patient Interpreter Needed:  None Criminal Activity/Legal Involvement Pertinent to Current Situation/Hospitalization:  No - Comment as needed Significant Relationships:  Spouse, Siblings, Other Family Members Lives with:  Siblings, Relatives Do you feel safe going back to the place where you live?  Yes Need for family participation in patient care:  Yes (Comment)  Care giving concerns:  PT recommending SNF once medically stable for discharge.   Social Worker assessment / plan:  CSW met with patient. No supports at bedside. CSW introduced role and explained that PT recommendations would be discussed. Patient agreeable to SNF placement. No preference on facility but wants to be in [facility]. Patient reports that he currently lives with people that are not a good influence. Patient wants to stay true to his Christian beliefs. Patient asked about CSW trying to get him a place to live at [facility] in [facility]. CSW looked up this facility and it is associated with the [employer]. CSW printed off and provided patient with information on how to apply to get on the wait list. Patient stated he was hoping CSW would complete the application process for him. CSW explained that there is simply not enough time in the day for CSW to do that. Patient is  fully oriented and capable of completing the application. CSW did offer to fax the application once completed. Patient is agreeable to this. CSW will provide SNF bed offers later today once more are obtained. No further concerns. CSW encouraged patient to contact CSW as needed. CSW will continue to follow patient for support and facilitate discharge to SNF once medically stable.  Employment status:  Unemployed Forensic scientist:  Information systems manager, Medicaid In [place] PT Recommendations:  [place] / Referral to community resources:  [place]  Patient/Family's Response to care:  Patient agreeable to SNF placement. Patient reports not much family support. Patient appreciated social work intervention.  Patient/Family's Understanding of and Emotional Response to Diagnosis, Current Treatment, and Prognosis:  Patient has a good understanding of the reason for admission and his need for rehab prior to returning home. Patient appears happy with hospital care.  Emotional Assessment Appearance:  Appears stated age Attitude/Demeanor/Rapport:  Other (Pleasant) Affect (typically observed):  Accepting, Appropriate, Calm, Pleasant Orientation:  Oriented to Self, Oriented to Place, Oriented to  Time, Oriented to Situation Alcohol / Substance use:  Tobacco Use, Alcohol Use Psych involvement (Current and /or in the community):  No (Comment)  Discharge Needs  Concerns to be addressed:  Care Coordination Readmission within the last 30 days:  No Current discharge risk:  Dependent with Mobility Barriers to Discharge:  Continued Medical Work up, Other (On Medicare inpatient day 2 of 3.)   Candie Chroman, LCSW  01/15/2017, 11:57 AM

## 2017-01-15 NOTE — NC FL2 (Signed)
[place] LEVEL OF CARE SCREENING TOOL     IDENTIFICATION  Patient Name: Jeremy Mcgee Birthdate: 1966-02-07 Sex: male Admission Date (Current Location): 01/13/2017  [facility] and [place] Number:  Herbalist and Address:  [facility] [place]. [facility], [place] [address], [place], [place] [address]      Provider Number: [id number]  Attending Physician Name and Address:  Louellen Molder, MD  Relative Name and Phone Number:       Current Level of Care: Hospital Recommended Level of Care: [place] Prior Approval Number:    Date Approved/Denied:   PASRR Number: [id number]  Discharge Plan: SNF    Current Diagnoses: Patient Active Problem List   Diagnosis Date Noted  . Pneumonia 01/14/2017  . Chronic back pain 01/13/2017  . Clonus 01/13/2017  . COPD with acute exacerbation ([place]) 01/13/2017  . Essential hypertension 01/13/2017  . Hyperglycemia 01/13/2017  . Helicobacter pylori gastritis 06/13/2014  . Splenosis 06/13/2014  . Alcoholic cirrhosis of liver without ascites ([place]) 04/18/2014  . Hepatitis C antibody test positive 04/18/2014  . Colon polyps 04/17/2014  . Gastritis 04/17/2014  . Hiatal hernia 04/17/2014  . Peritoneal carcinomatosis ([place]) 04/16/2014  . Elevated LFTs 04/16/2014  . History of ETOH abuse 04/16/2014  . Obesity, unspecified 04/16/2014  . COPD exacerbation ([place]) 04/16/2014  . CAD (coronary artery disease) 04/16/2014  . Constipation 04/16/2014  . History of splenectomy 04/16/2014  . Family history of colon cancer 04/16/2014  . Abdominal pain, epigastric 04/16/2014  . Tobacco abuse 04/16/2014  . Cholelithiasis 04/16/2014  . Peritoneal disease 04/15/2014    Orientation RESPIRATION BLADDER Height & Weight     Self, Time, Situation, Place  O2 (Nasal Canula 2 L) Continent Weight: 289 lb 12.8 oz (131.5 kg) Height:  5\' 8"  (172.7 cm)  BEHAVIORAL SYMPTOMS/MOOD NEUROLOGICAL BOWEL NUTRITION STATUS   (None)   (None) Continent Diet (Regular)  AMBULATORY STATUS COMMUNICATION OF NEEDS Skin   Limited Assist Verbally Bruising                       Personal Care Assistance Level of Assistance  Bathing, Feeding, Dressing Bathing Assistance: Limited assistance Feeding assistance: Independent Dressing Assistance: Limited assistance     Functional Limitations Info  Sight, Hearing, Speech Sight Info: Adequate Hearing Info: Adequate Speech Info: Adequate    SPECIAL CARE FACTORS FREQUENCY  PT (By licensed PT), Blood pressure, OT (By licensed OT)     PT Frequency: 5 x week OT Frequency: 5 x week            Contractures Contractures Info: Not present    Additional Factors Info  Code Status, Allergies Code Status Info: Full Allergies Info: Bee Venom, Penicillins           Current Medications (01/15/2017):  This is the current hospital active medication list Current Facility-Administered Medications  Medication Dose Route Frequency Provider Last Rate Last Dose  . 0.9 %  sodium chloride infusion  250 mL Intravenous PRN Opyd, Ilene Qua, MD      . acetaminophen (TYLENOL) tablet 650 mg  650 mg Oral Q6H PRN Opyd, Ilene Qua, MD       Or  . acetaminophen (TYLENOL) suppository 650 mg  650 mg Rectal Q6H PRN Opyd, Ilene Qua, MD      . alum & mag hydroxide-simeth (MAALOX/MYLANTA) 200-200-20 MG/5ML suspension 30 mL  30 mL Oral Q6H PRN Dhungel, Nishant, MD   30 mL at 01/14/17 1648  .  aspirin EC tablet 325 mg  325 mg Oral Daily Opyd, Ilene Qua, MD   325 mg at 01/14/17 0957  . atenolol (TENORMIN) tablet 50 mg  50 mg Oral Daily Opyd, Ilene Qua, MD   50 mg at 01/14/17 1133  . azithromycin (ZITHROMAX) 500 mg in dextrose 5 % 250 mL IVPB  500 mg Intravenous Q24H Opyd, Ilene Qua, MD   Stopped at 01/14/17 1930  . bisacodyl (DULCOLAX) EC tablet 5 mg  5 mg Oral Daily PRN Opyd, Ilene Qua, MD   5 mg at 01/14/17 1717  . cefTRIAXone (ROCEPHIN) 1 g in dextrose 5 % 50 mL IVPB  1 g Intravenous Q24H Opyd, Ilene Qua, MD 100 mL/hr at 01/14/17 1655 1 g at 01/14/17 1655  . chlorpheniramine-HYDROcodone (TUSSIONEX) 10-8 MG/5ML suspension 5 mL  5 mL Oral Q12H Dhungel, Nishant, MD   5 mL at 01/14/17 2051  . diazepam (VALIUM) tablet 5-10 mg  5-10 mg Oral Q8H PRN Opyd, Ilene Qua, MD   10 mg at 01/14/17 2215  . diltiazem (CARDIZEM CD) 24 hr capsule 240 mg  240 mg Oral Daily Opyd, Ilene Qua, MD      . diphenhydrAMINE (BENADRYL) capsule 50 mg  50 mg Oral Q4H PRN Opyd, Ilene Qua, MD   50 mg at 01/14/17 1830  . enoxaparin (LOVENOX) injection 40 mg  40 mg Subcutaneous QHS Opyd, Ilene Qua, MD   40 mg at 01/14/17 2210  . guaiFENesin-dextromethorphan (ROBITUSSIN DM) 100-10 MG/5ML syrup 5 mL  5 mL Oral Q4H PRN Opyd, Ilene Qua, MD   5 mL at 01/15/17 0421  . HYDROmorphone (DILAUDID) tablet 4 mg  4 mg Oral Q4H PRN Opyd, Ilene Qua, MD   4 mg at 01/14/17 1648  . insulin aspart (novoLOG) injection 0-9 Units  0-9 Units Subcutaneous TID WC Opyd, Ilene Qua, MD   1 Units at 01/14/17 1316  . ipratropium-albuterol (DUONEB) 0.5-2.5 (3) MG/3ML nebulizer solution 3 mL  3 mL Nebulization Q4H PRN Opyd, Ilene Qua, MD   3 mL at 01/15/17 0310  . ipratropium-albuterol (DUONEB) 0.5-2.5 (3) MG/3ML nebulizer solution 3 mL  3 mL Nebulization TID Dhungel, Nishant, MD   3 mL at 01/15/17 0812  . lisinopril (PRINIVIL,ZESTRIL) tablet 20 mg  20 mg Oral Daily Opyd, Ilene Qua, MD   20 mg at 01/14/17 0957  . Melatonin TABS 4.5 mg  4.5 mg Oral QHS Opyd, Ilene Qua, MD   4.5 mg at 01/14/17 2207  . methylPREDNISolone sodium succinate (SOLU-MEDROL) 125 mg/2 mL injection 60 mg  60 mg Intravenous Q6H Opyd, Ilene Qua, MD   60 mg at 01/15/17 9470  . mometasone-formoterol (DULERA) 200-5 MCG/ACT inhaler 2 puff  2 puff Inhalation BID Opyd, Ilene Qua, MD   2 puff at 01/15/17 0809  . morphine (MS CONTIN) 12 hr tablet 30 mg  30 mg Oral Q12H Opyd, Ilene Qua, MD   30 mg at 01/14/17 2207  . multivitamin with minerals tablet 1 tablet  1 tablet Oral Daily Opyd, Ilene Qua, MD   1  tablet at 01/14/17 0956  . naproxen (NAPROSYN) tablet 250 mg  250 mg Oral BID WC Opyd, Ilene Qua, MD   250 mg at 01/14/17 1824  . ondansetron (ZOFRAN) tablet 4 mg  4 mg Oral Q6H PRN Opyd, Ilene Qua, MD       Or  . ondansetron (ZOFRAN) injection 4 mg  4 mg Intravenous Q6H PRN Opyd, Ilene Qua, MD   4 mg at 01/14/17 1830  .  oxyCODONE (Oxy IR/ROXICODONE) immediate release tablet 5 mg  5 mg Oral Q6H PRN Opyd, Ilene Qua, MD   5 mg at 01/15/17 0421  . pantoprazole (PROTONIX) EC tablet 40 mg  40 mg Oral BID AC Opyd, Ilene Qua, MD   40 mg at 01/15/17 9150  . polyethylene glycol (MIRALAX / GLYCOLAX) packet 17 g  17 g Oral Daily PRN Opyd, Ilene Qua, MD   17 g at 01/14/17 1717  . sodium chloride flush (NS) 0.9 % injection 3 mL  3 mL Intravenous Q12H Opyd, Ilene Qua, MD   3 mL at 01/14/17 2220  . sodium chloride flush (NS) 0.9 % injection 3 mL  3 mL Intravenous PRN Opyd, Ilene Qua, MD      . vitamin B-12 (CYANOCOBALAMIN) tablet 1,000 mcg  1,000 mcg Oral Daily Opyd, Ilene Qua, MD   1,000 mcg at 01/14/17 0957     Discharge Medications: Please see discharge summary for a list of discharge medications.  Relevant Imaging Results:  Relevant Lab Results:   Additional Information SS#: [id number]  Candie Chroman, LCSW

## 2017-01-15 NOTE — Progress Notes (Signed)
Pt sleeping, awoken to given scheduled medications. Questions answered. Regarding medications and MRI.

## 2017-01-15 NOTE — Clinical Social Work Placement (Signed)
   CLINICAL SOCIAL WORK PLACEMENT  NOTE  Date:  01/15/2017  Patient Details  Name: Jeremy Mcgee MRN: [id number] Date of Birth: 08-25-1965  Clinical Social Work is seeking post-discharge placement for this patient at the [place] level of care (*CSW will initial, date and re-position this form in  chart as items are completed):  Yes   Patient/family provided with [place] Work Department's list of facilities offering this level of care within the geographic area requested by the patient (or if unable, by the patient's family).  Yes   Patient/family informed of their freedom to choose among providers that offer the needed level of care, that participate in Medicare, Medicaid or managed care program needed by the patient, have an available bed and are willing to accept the patient.  Yes   Patient/family informed of [place]'s ownership interest in [facility] and [facility], as well as of the fact that they are under no obligation to receive care at these facilities.  PASRR submitted to EDS on 01/14/17     PASRR number received on       Existing PASRR number confirmed on 01/14/17     FL2 transmitted to all facilities in geographic area requested by pt/family on 01/14/17     FL2 transmitted to all facilities within larger geographic area on       Patient informed that his/her managed care company has contracts with or will negotiate with certain facilities, including the following:            Patient/family informed of bed offers received.  Patient chooses bed at       Physician recommends and patient chooses bed at      Patient to be transferred to   on  .  Patient to be transferred to facility by       Patient family notified on   of transfer.  Name of family member notified:        PHYSICIAN Please sign FL2     Additional Comment:    _______________________________________________ Candie Chroman, LCSW 01/15/2017,  12:11 PM

## 2017-01-15 NOTE — Progress Notes (Signed)
Received a call from MRI, MRI with Anesthesia will be done in the AM. Pt sleeping will educate.

## 2017-01-15 NOTE — Progress Notes (Signed)
US  Abdomen results in Hard Chart from [facility]. MD made aware.

## 2017-01-15 NOTE — Clinical Social Work Note (Signed)
CSW provided patient with bed offers. There are only two from [facility]. Patient will take the rest of the day to decide which facility he wants to go to and CSW will follow up tomorrow on decision.  [place]ton Scrape, [place]

## 2017-01-16 ENCOUNTER — Inpatient Hospital Stay (HOSPITAL_COMMUNITY): Payer: Medicare Other

## 2017-01-16 ENCOUNTER — Encounter (HOSPITAL_COMMUNITY): Payer: Self-pay | Admitting: Anesthesiology

## 2017-01-16 ENCOUNTER — Encounter (HOSPITAL_COMMUNITY)
Admission: EM | Discharge status: Against Medical Advice | Payer: Self-pay | Source: Other Acute Inpatient Hospital | Attending: Internal Medicine

## 2017-01-16 ENCOUNTER — Encounter (HOSPITAL_COMMUNITY): Payer: Self-pay | Admitting: Certified Registered Nurse Anesthetist

## 2017-01-16 DIAGNOSIS — J9622 Acute and chronic respiratory failure with hypercapnia: Secondary | ICD-10-CM

## 2017-01-16 DIAGNOSIS — J9621 Acute and chronic respiratory failure with hypoxia: Secondary | ICD-10-CM

## 2017-01-16 LAB — CBC
HCT: 46.8 % (ref 39.0–52.0)
HEMOGLOBIN: 14.4 g/dL (ref 13.0–17.0)
MCH: 32 pg (ref 26.0–34.0)
MCHC: 30.8 g/dL (ref 30.0–36.0)
MCV: 104 fL — ABNORMAL HIGH (ref 78.0–100.0)
PLATELETS: 245 10*3/uL (ref 150–400)
RBC: 4.5 MIL/uL (ref 4.22–5.81)
RDW: 15.3 % (ref 11.5–15.5)
WBC: 24.3 10*3/uL — ABNORMAL HIGH (ref 4.0–10.5)

## 2017-01-16 LAB — URINALYSIS, ROUTINE W REFLEX MICROSCOPIC
Bilirubin Urine: NEGATIVE
Glucose, UA: NEGATIVE mg/dL
Hgb urine dipstick: NEGATIVE
Ketones, ur: NEGATIVE mg/dL
LEUKOCYTES UA: NEGATIVE
NITRITE: NEGATIVE
PROTEIN: NEGATIVE mg/dL
Specific Gravity, Urine: 1.014 (ref 1.005–1.030)
pH: 5 (ref 5.0–8.0)

## 2017-01-16 LAB — GLUCOSE, CAPILLARY
Glucose-Capillary: 111 mg/dL — ABNORMAL HIGH (ref 65–99)
Glucose-Capillary: 139 mg/dL — ABNORMAL HIGH (ref 65–99)
Glucose-Capillary: 115 mg/dL — ABNORMAL HIGH (ref 65–99)
Glucose-Capillary: 124 mg/dL — ABNORMAL HIGH (ref 65–99)

## 2017-01-16 LAB — COMPREHENSIVE METABOLIC PANEL
ALK PHOS: 55 U/L (ref 38–126)
ALT: 105 U/L — ABNORMAL HIGH (ref 17–63)
ANION GAP: 12 (ref 5–15)
AST: 76 U/L — ABNORMAL HIGH (ref 15–41)
Albumin: 3.9 g/dL (ref 3.5–5.0)
BUN: 48 mg/dL — ABNORMAL HIGH (ref 6–20)
CALCIUM: 9.3 mg/dL (ref 8.9–10.3)
CO2: 23 mmol/L (ref 22–32)
Chloride: 99 mmol/L — ABNORMAL LOW (ref 101–111)
Creatinine, Ser: 2.26 mg/dL — ABNORMAL HIGH (ref 0.61–1.24)
GFR calc non Af Amer: 32 mL/min — ABNORMAL LOW (ref >60)
GFR, EST AFRICAN AMERICAN: 37 mL/min — AB (ref >60)
Glucose, Bld: 99 mg/dL (ref 65–99)
Potassium: 5 mmol/L (ref 3.5–5.1)
SODIUM: 134 mmol/L — AB (ref 135–145)
Total Bilirubin: 0.6 mg/dL (ref 0.3–1.2)
Total Protein: 6.8 g/dL (ref 6.5–8.1)

## 2017-01-16 LAB — CREATININE, URINE, RANDOM: CREATININE, URINE: 74.89 mg/dL

## 2017-01-16 LAB — SODIUM, URINE, RANDOM: SODIUM UR: 31 mmol/L

## 2017-01-16 SURGERY — RADIOLOGY WITH ANESTHESIA
Anesthesia: General

## 2017-01-16 MED ORDER — SODIUM CHLORIDE 0.9 % IV SOLN
INTRAVENOUS | Status: AC
  Administered 2017-01-16: 08:00:00 via INTRAVENOUS

## 2017-01-16 MED ORDER — LACTATED RINGERS IV SOLN
INTRAVENOUS | Status: DC
  Administered 2017-01-16: 09:00:00 via INTRAVENOUS

## 2017-01-16 MED ORDER — LORAZEPAM 1 MG PO TABS
1.0000 mg | ORAL_TABLET | Freq: Three times a day (TID) | ORAL | Status: DC | PRN
Start: 2017-01-16 — End: 2017-01-17
  Administered 2017-01-16 – 2017-01-17 (×3): 1 mg via ORAL
  Filled 2017-01-16 (×3): qty 1

## 2017-01-16 MED ORDER — DIAZEPAM 5 MG/ML IJ SOLN: 10.0000 mg | Freq: Once | INTRAMUSCULAR | Status: DC

## 2017-01-16 NOTE — Progress Notes (Addendum)
Was able to contact his orthopedic surgeon in [place] ( Dr Byrd Hesselbach).  He informed that patient had MRI of the back previously only with 10 mg oral Valium given 30 minutes prior to procedure and no other sedation were given.  He also informed that patient has been scheduled multiple times for arthroscopy of his knees (for complains of knee pains) but patient has been canceling appointments stating various reasons.  I have discussed this with the patient and he agrees to try MRI with either Ativan or Valium. I'm not sure if he can lie down flat for MRI. This can be assessed tomorrow. if so MRI can be done with IV Valium or Ativan (patient agrees). Otherwise can be done as outpatient once his acute COPD results.

## 2017-01-16 NOTE — Anesthesia Preprocedure Evaluation (Deleted)
Anesthesia Evaluation  Patient identified by MRN, date of birth, ID band Patient awake    Reviewed: Allergy & Precautions, NPO status , Patient's Chart, lab work & pertinent test results  Airway Mallampati: III  TM Distance: <3 FB Neck ROM: Limited  Mouth opening: Limited Mouth Opening  Dental  (+) Edentulous Lower   Pulmonary asthma , pneumonia, COPD, Current Smoker,     + decreased breath sounds+ wheezing      Cardiovascular hypertension, + CAD   Rhythm:Regular Rate:Normal  There has been no W/U of CAD here   Neuro/Psych    GI/Hepatic hiatal hernia, (+) Cirrhosis       , Hepatitis -, C  Endo/Other  Morbid obesity  Renal/GU Renal disease     Musculoskeletal   Abdominal (+) + obese,   Peds  Hematology   Anesthesia Other Findings   Reproductive/Obstetrics                           Anesthesia Physical Anesthesia Plan  ASA: IV  Anesthesia Plan: General   Post-op Pain Management:    Induction: Intravenous  PONV Risk Score and Plan: 3 and Ondansetron, Dexamethasone, Propofol and Midazolam  Airway Management Planned: Oral ETT  Additional Equipment:   Intra-op Plan:   Post-operative Plan: Post-operative intubation/ventilation  Informed Consent: I have reviewed the patients History and Physical, chart, labs and discussed the procedure including the risks, benefits and alternatives for the proposed anesthesia with the patient or authorized representative who has indicated his/her understanding and acceptance.   Dental advisory given  Plan Discussed with: CRNA  Anesthesia Plan Comments:         Anesthesia Quick Evaluation

## 2017-01-16 NOTE — Progress Notes (Addendum)
PROGRESS NOTE                                                                                                                                                                                                             Jeremy Mcgee Demographics:    Jeremy Mcgee, is a 51 y.o. male, DOB - January 09, 1966, ERX:[id number]  Admit date - 01/13/2017   Admitting Physician Vianne Bulls, MD  Outpatient Primary MD for the Jeremy Mcgee is Jeremy Mcgee, No Pcp Per  LOS - 3  Outpatient Specialists: Dr Byrd Hesselbach , orthopedic surgeon in [place]  No chief complaint on file.      Brief Narrative   51 year old morbidly obese male with COPD with chronic hypoxic respiratory failure, alcoholic cirrhosis without ascites (no active alcohol use), coronary artery disease, hypertension, chronic low back pain with radiculopathy, bilateral knee pain was sent from [facility] where he was admitted on 6/17 with acute exacerbation of COPD and worsening chronic back pain with recurrent falls over the past few months due to bilateral lower extremity weakness.  Jeremy Mcgee reports chronic low back pain for many years and was following with Dr. Pascal Lux who is an orthopedic surgeon in [place]. He had MRI of the lumbar spine done in early 2017 showing lateral recess narrowing of left L2-L3 and left foraminal stenosis of L4-L5. He has been treated with pain medications and multiple steroid injections. He also follows with pain management and is being treated with long-acting morphine and oxycodone IR. He informs his lower extremity weakness to be bilateral and present for past 3-4 months. He has experienced buckling of his knees causing multiple falls. No head injury or loss of consciousness. He informs the pain radiates down to his legs but denies any bowel or urinary incontinence/retention. Jeremy Mcgee was last seen by his orthopedics in March when he received steroid injection to  the knees. He was planned to have MRI of his back and knees but due to severe claustrophobia this was not possible.  Jeremy Mcgee was then admitted to [facility] for acute exacerbation of COPD and ongoing severe low back pain with bilateral leg weakness. Blood work done showed leukocytosis. B12 was normal. Jeremy Mcgee was being treated with Solu-Medrol and nebs along with empiric antibiotics. Jeremy Mcgee was unable to tolerate MRI of his low back due to severe claustrophobia. Since he would need an open  MRI or done under sedation followed by possible neurosurgical consultation he was transferred to [facility].    Subjective:   Jeremy Mcgee taken down for MRI of his back. Early apparently done under conscious sedation and given his respiratory function and Jeremy Mcgee unable to lie down flat he would have to be intubated. Discussed this with Jeremy Mcgee and his hesitant to be on the ventilator. I suggested that it would be better to have his active COPD improved and once he is back to baseline MRI can be done under conscious sedation in near future and Jeremy Mcgee agrees. MRI canceled. Still having cough and breathing mildly improved. Still complains of back pain and feeling anxious.   Assessment  & Plan :    Principal Problem:   COPD With acute exacerbation ([place]) Has chronic hypoxic respiratory failure and centrilobular emphysema. On 2-3 L oxygen at home. Continue current dose of IV Solu-Medrol, continue scheduled meds, empiric antibiotics, supplemental oxygen, antitussives. Being counseled on smoking cessation. Continue nicotine patch.    Active Problems: Chronic low back pain with bilateral lower extremity weakness Suspect lumbar radiculopathy . MRI done in January 2017 (see care everywhere) shows lumbar stenosis. Jeremy Mcgee reports multiple falls at home and is very weak on PT evaluation. No clinical signs of cord compression. -MRI of the lumbar spine plan under conscious sedation but Jeremy Mcgee was have to be intubated  given his current respiratory status. (See subjective for details).  -I think the best course of action would be to let his acute COPD exacerbation resolved and Jeremy Mcgee respiratory status improved back to baseline where is able to light on flat and tolerated conscious sedation. He can be discharged to SNF in 1-2 days and have MRI arranged either by his orthopedic surgeon in [place] or from SNF.  -His home pain medications have been resumed which include MS Contin and OxyIR. Also added when necessary Dilaudid . His continue NSAIDs given worsened renal function.  Acute kidney injury Suspect prerenal with dehydration versus ATN due to NSAID use. Jeremy Mcgee also received her dose of IV Lasix yesterday and has been on ACE inhibitor. Discontinue all offending agents and place him on gentle hydration. Check UA and urine lites.    CAD (coronary artery disease) Continue aspirin     Alcoholic cirrhosis of liver without ascites, ([place]) Has transaminitis which seems chronic ( noted on prior labs).  Tylenol and salic[place] levels are normal. Ammonia level elevated. INR normal. abdominal ultrasound done at outside hospital shows chr hepatocellular disease. LFTs improving.    Hyperglycemia with prediabetes Secondary to steroid use. Monitor on sliding scale coverage. A1C of  5.7 .  Essential hypertension. Stable. Continue Cardizem. hold lisinopril due to history of present illness.   Morbid obesity with? OHS Needs counseling on diet and exercise. Recommend outpatient sleep study. Jeremy Mcgee is now living in [facility] and would benefit from seeing a pulmonologist in that area.  Leukocytosis   possibly due to steroid. No signs of infection.  Vitamin B12 deficiency Added supplement.  Code Status : Full code  Family Communication  : None at bedside  Disposition Plan  : SNF possibly in the next 24-48 hours his respiratory function and renal function improves.  Barriers For Discharge : Active  symptoms  Consults  :  None  Procedures  : None  DVT Prophylaxis  :  Lovenox   Lab Results  Component Value Date   PLT 245 01/16/2017    Antibiotics  :    Anti-infectives  Start     Dose/Rate Route Frequency Ordered Stop   01/14/17 2000  azithromycin (ZITHROMAX) 500 mg in dextrose 5 % 250 mL IVPB     500 mg 250 mL/hr over 60 Minutes Intravenous Every 24 hours 01/13/17 2340     01/14/17 1800  cefTRIAXone (ROCEPHIN) 1 g in dextrose 5 % 50 mL IVPB    Comments:  Tolerating Rocephin at [facility] prior to transfer   1 g 100 mL/hr over 30 Minutes Intravenous Every 24 hours 01/13/17 2340          Objective:   Vitals:   01/15/17 2236 01/16/17 0325 01/16/17 0457 01/16/17 0956  BP: (!) 90/57 107/86 (!) 99/56 119/74  Pulse: 74 86 60 66  Resp: 16  17   Temp: 97.1 F (36.2 C)  97.7 F (36.5 C)   TempSrc: Oral  Oral   SpO2: 93%  97% 96%  Weight:   128.9 kg (284 lb 3.2 oz)   Height:        Wt Readings from Last 3 Encounters:  01/16/17 128.9 kg (284 lb 3.2 oz)  01/04/15 122.5 kg (270 lb)  06/19/14 127 kg (280 lb)     Intake/Output Summary (Last 24 hours) at 01/16/17 1054 Last data filed at 01/16/17 0859  Gross per 24 hour  Intake              720 ml  Output              925 ml  Net             -205 ml     Physical Exam Gen.: Morbidly obese, anxious and tearful during conversation HEENT: Moist mucosa, supple neck Chest: Bilateral scattered rhonchi CVS: Normal S1 and S2, no murmurs GI: Soft, nondistended, nontender Muscles culture: Warm, no edema, lumbar spine tenderness      Data Review:    CBC  Recent Labs Lab 01/14/17 0039 01/15/17 0229 01/16/17 0259  WBC 26.7* 27.8* 24.3*  HGB 13.5 14.9 14.4  HCT 41.7 45.3 46.8  PLT 241 255 245  MCV 101.2* 101.6* 104.0*  MCH 32.8 33.4 32.0  MCHC 32.4 32.9 30.8  RDW 14.9 15.1 15.3  LYMPHSABS 1.6  --   --   MONOABS 1.3*  --   --   EOSABS 0.0  --   --   BASOSABS 0.0  --   --     Chemistries   Recent  Labs Lab 01/14/17 0039 01/15/17 0229 01/16/17 0259  NA 137 137 134*  K 3.8 4.4 5.0  CL 106 103 99*  CO2 22 23 23   GLUCOSE 224* 122* 99  BUN 18 26* 48*  CREATININE 1.10 1.18 2.26*  CALCIUM 8.7* 9.5 9.3  AST 61* 63* 76*  ALT 99* 104* 105*  ALKPHOS 52 53 55  BILITOT 0.2* 0.5 0.6   ------------------------------------------------------------------------------------------------------------------ No results for input(s): CHOL, HDL, LDLCALC, TRIG, CHOLHDL, LDLDIRECT in the last 72 hours.  Lab Results  Component Value Date   HGBA1C 5.7 (H) 01/14/2017   ------------------------------------------------------------------------------------------------------------------ No results for input(s): TSH, T4TOTAL, T3FREE, THYROIDAB in the last 72 hours.  Invalid input(s): FREET3 ------------------------------------------------------------------------------------------------------------------  Recent Labs  01/14/17 0810  VITAMINB12 335    Coagulation profile  Recent Labs Lab 01/14/17 0039  INR 1.04    No results for input(s): DDIMER in the last 72 hours.  Cardiac Enzymes No results for input(s): CKMB, TROPONINI, MYOGLOBIN in the last 168 hours.  Invalid input(s): CK ------------------------------------------------------------------------------------------------------------------ No results found  for: BNP  Inpatient Medications  Scheduled Meds: . aspirin EC  325 mg Oral Daily  . atenolol  50 mg Oral Daily  . chlorpheniramine-HYDROcodone  5 mL Oral Q12H  . diltiazem  240 mg Oral Daily  . enoxaparin (LOVENOX) injection  40 mg Subcutaneous QHS  . insulin aspart  0-15 Units Subcutaneous TID WC  . ipratropium-albuterol  3 mL Nebulization TID  . Melatonin  4.5 mg Oral QHS  . methylPREDNISolone (SOLU-MEDROL) injection  60 mg Intravenous Q12H  . mometasone-formoterol  2 puff Inhalation BID  . morphine  30 mg Oral Q12H  . multivitamin with minerals  1 tablet Oral Daily  .  pantoprazole  40 mg Oral BID AC  . sodium chloride flush  3 mL Intravenous Q12H  . vitamin B-12  1,000 mcg Oral Daily   Continuous Infusions: . sodium chloride    . sodium chloride 100 mL/hr at 01/16/17 0752  . azithromycin Stopped (01/15/17 2218)  . cefTRIAXone (ROCEPHIN)  IV 1 g (01/14/17 1655)  . lactated ringers 50 mL/hr at 01/16/17 0840   PRN Meds:.sodium chloride, acetaminophen **OR** acetaminophen, alum & mag hydroxide-simeth, bisacodyl, diphenhydrAMINE, guaiFENesin-dextromethorphan, HYDROmorphone, ipratropium-albuterol, LORazepam, ondansetron **OR** ondansetron (ZOFRAN) IV, oxyCODONE, polyethylene glycol, sodium chloride flush  Micro Results No results found for this or any previous visit (from the past 240 hour(s)).  Radiology Reports Dg Chest Port 1 View  Result Date: 01/14/2017 CLINICAL DATA:  Acute onset of shortness of breath and wheezing. Initial encounter. EXAM: PORTABLE CHEST 1 VIEW COMPARISON:  Chest radiograph performed 01/12/2017 FINDINGS: The lungs are well-aerated. Mild bibasilar airspace opacities raise concern for pneumonia. There is no evidence of pleural effusion or pneumothorax. The cardiomediastinal silhouette is within normal limits. No acute osseous abnormalities are seen. IMPRESSION: Mild bibasilar airspace opacities raise concern for pneumonia. Electronically Signed   By: Garald Balding M.D.   On: 01/14/2017 00:56    Time Spent in minutes  35   Louellen Molder M.D on 01/16/2017 at 10:54 AM  Between 7am to 7pm - Pager - [phone]  After 7pm go to [email or web address] - password [facility]  [facility] -  Office  [phone]

## 2017-01-16 NOTE — Plan of Care (Signed)
Problem: Tissue Perfusion: Goal: Risk factors for ineffective tissue perfusion will decrease Outcome: Not Progressing Requiring oxygen at this time

## 2017-01-16 NOTE — Progress Notes (Signed)
PT Cancellation Note  Patient Details Name: Jeremy Mcgee MRN: [id number] DOB: 08/30/1965   Cancelled Treatment:    Reason Eval/Treat Not Completed: Patient declined, no reason specified Pt declined due to feeling very groggy. Pt falling asleep while speaking with therapist and reported he had medication that is likely the reason. PT will continue to follow acutely.    Salina April, PTA Pager: [phone]   01/16/2017, 3:11 PM

## 2017-01-16 NOTE — Progress Notes (Signed)
Patient stable during 7 a to 7 p shift with constant complaints of pain and anxiety.  Every time RN enters room patient asks for "whatever he can have" even if the nurse just administered pain medication within the last 30 minutes.  Patient very forgetful makes statements like "I haven't have my pain meds all day" when the nurse reminds him of all the medication he has received he then agrees with nurse.  Patient all over room, continually taking leads off and whole tele box but then denying that he did it when staff enters room.  Patient is aware that MRI will be reattempted tomorrow with IV Valium.

## 2017-01-16 NOTE — Clinical Social Work Note (Addendum)
Patient provided CSW with his ID and social security card. CSW made a copy for his [employer] application and returned cards to patient. Patient's application also requires a copy of his birth certificate and a rewards letter from the social security office. Patient stated that at one time he was getting food assistance from [place] in [facility] and they would likely have a copy of his birth certificate. CSW left a voicemail at [place]. Awaiting call back. CSW called the social security office regarding the rewards letter. Staff stated that the patient would have to call a request the letter and they could mail it to him. CSW provided patient with the phone number to the social security office and asked him to see if they could fax it to [place]. Patient will try later this afternoon or in the morning. Patient has accepted bed offer from [facility] in [facility]. CSW notified admissions coordinator that patient will likely discharge in 1-2 days. She stated they can take him when ready.  Dayton Scrape, [place] [phone]  2:41 pm Patient called CSW and stated that he spoke with the social security office and they will mail his rewards letter no later than Tuesday of next week. They are unable to fax it to [place].  Dayton Scrape, [place]

## 2017-01-16 NOTE — Progress Notes (Addendum)
Pt refuse bed alarm at this time, frequently try to get out of the bed, and I am switching patient back and forth to chair and bed, pt complaining hot, room temperature is all the way down and besides extra cooler fan is there in room, as pt is moving and pulling IV unable to continue IV fluid this time, MD aware

## 2017-01-17 ENCOUNTER — Inpatient Hospital Stay (HOSPITAL_COMMUNITY): Payer: Medicare Other

## 2017-01-17 LAB — CBC
HEMATOCRIT: 43.5 % (ref 39.0–52.0)
Hemoglobin: 14 g/dL (ref 13.0–17.0)
MCH: 32.6 pg (ref 26.0–34.0)
MCHC: 32.2 g/dL (ref 30.0–36.0)
MCV: 101.2 fL — AB (ref 78.0–100.0)
PLATELETS: 220 10*3/uL (ref 150–400)
RBC: 4.3 MIL/uL (ref 4.22–5.81)
RDW: 14.7 % (ref 11.5–15.5)
WBC: 22.4 10*3/uL — AB (ref 4.0–10.5)

## 2017-01-17 LAB — COMPREHENSIVE METABOLIC PANEL
ALT: 100 U/L — ABNORMAL HIGH (ref 17–63)
ANION GAP: 12 (ref 5–15)
AST: 69 U/L — ABNORMAL HIGH (ref 15–41)
Albumin: 3.7 g/dL (ref 3.5–5.0)
Alkaline Phosphatase: 53 U/L (ref 38–126)
BUN: 75 mg/dL — ABNORMAL HIGH (ref 6–20)
CHLORIDE: 101 mmol/L (ref 101–111)
CO2: 19 mmol/L — ABNORMAL LOW (ref 22–32)
Calcium: 8.7 mg/dL — ABNORMAL LOW (ref 8.9–10.3)
Creatinine, Ser: 2.44 mg/dL — ABNORMAL HIGH (ref 0.61–1.24)
GFR calc non Af Amer: 29 mL/min — ABNORMAL LOW (ref >60)
GFR, EST AFRICAN AMERICAN: 34 mL/min — AB (ref >60)
Glucose, Bld: 109 mg/dL — ABNORMAL HIGH (ref 65–99)
POTASSIUM: 5.3 mmol/L — AB (ref 3.5–5.1)
Sodium: 132 mmol/L — ABNORMAL LOW (ref 135–145)
Total Bilirubin: 0.7 mg/dL (ref 0.3–1.2)
Total Protein: 6.3 g/dL — ABNORMAL LOW (ref 6.5–8.1)

## 2017-01-17 LAB — GLUCOSE, CAPILLARY
Glucose-Capillary: 116 mg/dL — ABNORMAL HIGH (ref 65–99)
Glucose-Capillary: 113 mg/dL — ABNORMAL HIGH (ref 65–99)
Glucose-Capillary: 129 mg/dL — ABNORMAL HIGH (ref 65–99)

## 2017-01-17 LAB — BLOOD GAS, ARTERIAL
ACID-BASE DEFICIT: 4 mmol/L — AB (ref 0.0–2.0)
BICARBONATE: 21.5 mmol/L (ref 20.0–28.0)
DRAWN BY: 25788
O2 Content: 2 L/min
O2 Saturation: 92.3 %
PATIENT TEMPERATURE: 98.6
pCO2 arterial: 46.1 mmHg (ref 32.0–48.0)
pH, Arterial: 7.291 — ABNORMAL LOW (ref 7.350–7.450)
pO2, Arterial: 69.4 mmHg — ABNORMAL LOW (ref 83.0–108.0)

## 2017-01-17 LAB — AMMONIA: AMMONIA: 29 umol/L (ref 9–35)

## 2017-01-17 LAB — TSH: TSH: 0.38 u[IU]/mL (ref 0.350–4.500)

## 2017-01-17 MED ORDER — VITAMIN B-1 100 MG PO TABS
100.0000 mg | ORAL_TABLET | Freq: Every day | ORAL | Status: DC
  Administered 2017-01-17 – 2017-01-18 (×2): 100 mg via ORAL
  Filled 2017-01-17 (×2): qty 1

## 2017-01-17 MED ORDER — LORAZEPAM 1 MG PO TABS
1.0000 mg | ORAL_TABLET | Freq: Four times a day (QID) | ORAL | Status: DC | PRN
  Administered 2017-01-17 – 2017-01-19 (×3): 1 mg via ORAL
  Filled 2017-01-17 (×3): qty 1

## 2017-01-17 MED ORDER — LORAZEPAM 2 MG/ML IJ SOLN: 1.0000 mg | Freq: Four times a day (QID) | INTRAMUSCULAR | Status: DC | PRN

## 2017-01-17 MED ORDER — THIAMINE HCL 100 MG/ML IJ SOLN: 100.0000 mg | Freq: Every day | INTRAMUSCULAR | Status: DC

## 2017-01-17 MED ORDER — FOLIC ACID 1 MG PO TABS
1.0000 mg | ORAL_TABLET | Freq: Every day | ORAL | Status: DC
  Administered 2017-01-17 – 2017-01-21 (×5): 1 mg via ORAL
  Filled 2017-01-17 (×5): qty 1

## 2017-01-17 MED ORDER — SODIUM CHLORIDE 0.9 % IV SOLN
INTRAVENOUS | Status: DC
  Administered 2017-01-18 (×2): via INTRAVENOUS

## 2017-01-17 MED ORDER — OLANZAPINE 2.5 MG PO TABS
2.5000 mg | ORAL_TABLET | Freq: Every day | ORAL | Status: DC
  Administered 2017-01-17 – 2017-01-21 (×5): 2.5 mg via ORAL
  Filled 2017-01-17 (×6): qty 1

## 2017-01-17 MED ORDER — HYDROCOD POLST-CPM POLST ER 10-8 MG/5ML PO SUER: 5.0000 mL | Freq: Two times a day (BID) | ORAL | Status: DC | PRN

## 2017-01-17 NOTE — Progress Notes (Signed)
While rounding on the patient, Nurse Tech and I found that patient had a hospital bag filled with antianxiety medication and muscle relaxer, patient admits to taking some, medication removed to sent to pharmacy for storage, pt's brother and girlfriend contacted to see if they can take medications home which they were not, room search to ensure that all medications removed, and patient ensured that they would be returned at discharge Neta Mends RN 7:52 PM 01-17-2017

## 2017-01-17 NOTE — Progress Notes (Addendum)
Pt is so anxious all the time, just trying to get out of the bed all the time, RN keeps coming and adjusting patient all the time but patient is so noncompliant to everything RN says to him, taking out the oxygen, pulling IV, taking off tele monitor, he is alert and oriented but restless and anxious at same time, refusing bed alarm. 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

## 2017-01-17 NOTE — Progress Notes (Signed)
Paged MD for sitter order and talked with charge nurse regarding high low bed based on the current situation of the patient

## 2017-01-17 NOTE — Progress Notes (Signed)
Physical Therapy Treatment Patient Details Name: Jeremy Mcgee MRN: [id number] DOB: Jul 10, 1966 Today's Date: 01/17/2017    History of Present Illness Pt is a 51 yo male with c/o of SoB over increasing over the past week. Pt also experiencing 7/10 LBP that radiates into his LE L>R. PMH significant for COPD with chronic hypoxic respiratory failure, alcoholic cirrhosis without ascites, remote alcohol abuse, coronary artery disease, hypertension    PT Comments    Pt with change in behavior per nursing staff. Pt falling asleep while sitting, standing, and talking to therapist. Pt ambulated short distance then refused any further mobility and became agitated--unsure of reason. Sitter present. Continue to progress as tolerated.   Follow Up Recommendations  SNF     Equipment Recommendations  None recommended by PT    Recommendations for Other Services OT consult     Precautions / Restrictions Precautions Precautions: Fall Precaution Comments: Pt has fallen 6 times in the last 6 months, and after the last fall has developed a constant UE tremor R>L.     Mobility  Bed Mobility               General bed mobility comments: sitting EOB upon arrival with sitter present  Transfers Overall transfer level: Needs assistance Equipment used: Straight cane;None Transfers: Sit to/from Stand Sit to Stand: Min guard;Min assist         General transfer comment: min guard with SPC first trial and second trial from EOB pt stood impulsively without AD despite cues for safety and holding onto furniture in room; pt looking for his cell phone that was lying on the bed   Ambulation/Gait Ambulation/Gait assistance: Mod assist Ambulation Distance (Feet): 20 Feet Assistive device: Straight cane Gait Pattern/deviations: Step-through pattern;Decreased stride length;Staggering left;Staggering right     General Gait Details: assist for balance; pt using SPC; pt closing eyes while  ambulating   Stairs            Wheelchair Mobility    Modified Rankin (Stroke Patients Only)       Balance Overall balance assessment: Needs assistance Sitting-balance support: No upper extremity supported Sitting balance-Leahy Scale: Fair     Standing balance support: Single extremity supported Standing balance-Leahy Scale: Poor                              Cognition Arousal/Alertness: Lethargic Behavior During Therapy: Agitated;Flat affect;Impulsive Overall Cognitive Status: Impaired/Different from baseline Area of Impairment: Attention;Awareness;Safety/judgement                   Current Attention Level: Selective     Safety/Judgement: Decreased awareness of safety;Decreased awareness of deficits Awareness: Intellectual   General Comments: pt falling in and out of sleep while sitting, standing, and talking; pt became agitated and limited mobility but unsure of reason; pt with garbled/slurred speech--nursing staff aware of pt's behavior      Exercises      General Comments General comments (skin integrity, edema, etc.): SpO2 90% on 2L O2 at rest prior to mobility; end of session pt took O2 off and refused to let therapist get SpO2 reading; pt encouraged to put O2 back on and pt said "don't worry about it. I'll check it tonight before bed."       Pertinent Vitals/Pain Pain Assessment: Faces Faces Pain Scale: No hurt    Home Living  Prior Function            PT Goals (current goals can now be found in the care plan section) Progress towards PT goals: Progressing toward goals    Frequency    Min 2X/week      PT Plan Current plan remains appropriate    Co-evaluation              AM-PAC PT "6 Clicks" Daily Activity  Outcome Measure  Difficulty turning over in bed (including adjusting bedclothes, sheets and blankets)?: A Little Difficulty moving from lying on back to sitting on the side of  the bed? : A Lot Difficulty sitting down on and standing up from a chair with arms (e.g., wheelchair, bedside commode, etc,.)?: Total Help needed moving to and from a bed to chair (including a wheelchair)?: A Little Help needed walking in hospital room?: A Little Help needed climbing 3-5 steps with a railing? : Total 6 Click Score: 13    End of Session Equipment Utilized During Treatment: Gait belt;Oxygen Activity Tolerance: Patient limited by lethargy Patient left: in bed;with call bell/phone within reach;with nursing/sitter in room (sitter present ) Nurse Communication: Mobility status PT Visit Diagnosis: Unsteadiness on feet (R26.81);Repeated falls (R29.6);Other abnormalities of gait and mobility (R26.89);Muscle weakness (generalized) (M62.81);History of falling (Z91.81);Difficulty in walking, not elsewhere classified (R26.2);Other symptoms and signs involving the nervous system (R29.898);Pain Pain - part of body: Leg     Time: 1522-1540 PT Time Calculation (min) (ACUTE ONLY): 18 min  Charges:  $Gait Training: 8-22 mins                    G Codes:       Earney Navy, PTA Pager: [phone]     Darliss Cheney 01/17/2017, 4:23 PM

## 2017-01-17 NOTE — Progress Notes (Signed)
MD notified that patient refuses to have tele box on, order received to have tele discontinued Neta Mends RN 7:54 PM 01-17-2017

## 2017-01-17 NOTE — Progress Notes (Signed)
Patient continuously removing tele box, is uncooperative, with periods of aggression Neta Mends RN 7:55 PM 01-17-2017

## 2017-01-17 NOTE — Progress Notes (Addendum)
Safety sitter physical in person modified to Tele-sitter, waiting to come up and RN also gave report about the medicines bag which RN saw this  Am while patient was standing in-front of closet and looking for medicines, verified with Charge nurse too and from pt's admission note I saw pt's two of the narcotic medicine is in pharmacy, will pass it to morning nurse to take care of it.  Palma Holter, RN

## 2017-01-17 NOTE — Progress Notes (Addendum)
Sitter initiated and obtained from 12:44am, Tele - Sitter ruled out because patient is so restless and impulsive, same time non complaint too. As he is trying to jump out of the bed and pulling all the lines and wires, per RN judgement Tele sitter will make patient more aggravated but not help. will continue to monitor the patient and DC sitter as soon as patient become little more stable.  Palma Holter, RN

## 2017-01-17 NOTE — Progress Notes (Signed)
IV left forearm infiltrated and painful per patient.  IV removed.  Bilateral arms assessed for new IV access, arms edematous, no areas for IV access noted.  IV team consulted.

## 2017-01-17 NOTE — Progress Notes (Signed)
Patient extremely agitated, sedated and restless, non cooperative, continuously removing tele box, MD Xu notified to have order to have order for physical sitter in the room.Neta Mends RN 7:47 PM 01-17-2017

## 2017-01-17 NOTE — Progress Notes (Addendum)
Sitter left the room at 3:00am, called staffing, patient is in the still same condition, trying to stay inside in patient's room as long as possible  4.30am  Walked in a hallway with patient, pt wants to smoke now and wants to get out of the hospital.  4.45am  RN found the bunch of home medicine in patient's drawer, According to the patient pharmacy already took his two of the medicine (Narcotic). Double verified with admission doc flow-sheet, When RN asked him that RN is going to drop medicines to pharmacy until he got discharged, pt refused and screamed that he does not want anybody to touch his medicine, will try this am again.  4.48am:  Pt is refusing to put back tele monitor and frequently taking it off, so asked tele monitor to put in standby until pt allows nurse to use it again

## 2017-01-17 NOTE — Progress Notes (Signed)
PROGRESS NOTE                                                                                                                                                                                                             Jeremy Mcgee Demographics:    Jeremy Mcgee, is a 51 y.o. male, DOB - Mar 04, 1966, KJZ:[id number]  Admit date - 01/13/2017   Admitting Physician Vianne Bulls, MD  Outpatient Primary MD for the Jeremy Mcgee is Jeremy Mcgee, No Pcp Per  LOS - 4  Outpatient Specialists: Dr Byrd Hesselbach , orthopedic surgeon in [place]  No chief complaint on file.      Brief Narrative   51 year old morbidly obese male with COPD with chronic hypoxic respiratory failure, alcoholic cirrhosis without ascites (no active alcohol use), coronary artery disease, hypertension, chronic low back pain with radiculopathy, bilateral knee pain was sent from [facility] where he was admitted on 6/17 with acute exacerbation of COPD and worsening chronic back pain with recurrent falls over the past few months due to bilateral lower extremity weakness.  Jeremy Mcgee reports chronic low back pain for many years and was following with Dr. Pascal Lux who is an orthopedic surgeon in [place]. He had MRI of the lumbar spine done in early 2017 showing lateral recess narrowing of left L2-L3 and left foraminal stenosis of L4-L5. He has been treated with pain medications and multiple steroid injections. He also follows with pain management and is being treated with long-acting morphine and oxycodone IR. He informs his lower extremity weakness to be bilateral and present for past 3-4 months. He has experienced buckling of his knees causing multiple falls. No head injury or loss of consciousness. He informs the pain radiates down to his legs but denies any bowel or urinary incontinence/retention. Jeremy Mcgee was last seen by his orthopedics in March when he received steroid injection to  the knees. He was planned to have MRI of his back and knees but due to severe claustrophobia this was not possible.  Jeremy Mcgee was then admitted to [facility] for acute exacerbation of COPD and ongoing severe low back pain with bilateral leg weakness. Blood work done showed leukocytosis. B12 was normal. Jeremy Mcgee was being treated with Solu-Medrol and nebs along with empiric antibiotics. Jeremy Mcgee was unable to tolerate MRI of his low back due to severe claustrophobia. Since he would need an open  MRI or done under sedation followed by possible neurosurgical consultation he was transferred to [facility].    Subjective:   Very confused and agitated, not oriented to place.  No cough noticed during encounter, he is not able to provide detailed history.   Assessment  & Plan :    Principal Problem:  Metabolic encephalopathy;  Not sure last drink, could from alcohol withdrawal? From uncontrolled infection? Hypoxia? Will get abg, check ammonia, rpr, b1, tsh, obtain blood culture,  Start alcohol withdrawal protocol Start antipsychotic with zyprexa, sitter in room,  For fall precaution, cpap ordered as well   COPD With acute exacerbation ([place]), on home o2 Has chronic hypoxic respiratory failure and centrilobular emphysema. On 2-3 L oxygen at home. Continue current dose of IV Solu-Medrol, continue scheduled meds, empiric antibiotics zithromax/ropcephin since admission, supplemental oxygen, antitussives. Being counseled on smoking cessation. Continue nicotine patch.   Leukocytosis   possibly due to steroid. Dehydration? Already on abx, culture pending.  Acute kidney injury -ua no infection, renal ultrasound , no acute findings -Suspect prerenal with dehydration versus ATN due to NSAID use. Jeremy Mcgee also received her dose of IV Lasix yesterday and has been on ACE inhibitor. Discontinue all offending agents and place him on gentle hydration.   Active Problems: Chronic low back pain with bilateral  lower extremity weakness Suspect lumbar radiculopathy . MRI done in January 2017 (see care everywhere) shows lumbar stenosis. Jeremy Mcgee reports multiple falls at home and is very weak on PT evaluation. No clinical signs of cord compression. -MRI of the lumbar spine plan under conscious sedation but Jeremy Mcgee was have to be intubated given his current respiratory status. (See subjective for details).  -I think the best course of action would be to let his acute COPD exacerbation resolved and Jeremy Mcgee respiratory status improved back to baseline where is able to light on flat and tolerated conscious sedation. He can be discharged to SNF in 1-2 days and have MRI arranged either by his orthopedic surgeon in [place] or from SNF.  -His home pain medications have been resumed which include MS Contin and OxyIR. Also added when necessary Dilaudid . His continue NSAIDs given worsened renal function.      Alcoholic cirrhosis of liver without ascites, ([place]) with h/o hepc in 2015 Has transaminitis which seems chronic ( noted on prior labs).  Tylenol and salicylate levels are normal. Ammonia level elevated on 6/19, . INR normal. abdominal ultrasound done at outside hospital shows chr hepatocellular disease. LFTs improving.   Hyperglycemia with prediabetes Not on meds at home, elevated blood sugar Secondary to steroid use. Monitor on sliding scale coverage. A1C of  5.7 .  CAD (coronary artery disease) Continue aspirin,  I have reviewed record in care everywhere, he had a cardiac cath done  in 2016 which was unremarkable   Essential hypertension. Stable. Continue betablocker,  hold lisinopril due to arf. I donot see cardizem on his home meds, his blood pressure is low normal, will d/c cardizem   Morbid obesity with? OHS Body mass index is 43.01 kg/m. Start nightly cpap here, will need outpatient sleep study Needs counseling on diet and exercise  Jeremy Mcgee is now living in [facility] and would benefit  from seeing a pulmonologist in that area.   Vitamin B12 deficiency Added supplement.  Code Status : Full code  Family Communication  : None at bedside  Disposition Plan  : SNF , pending mental status,  respiratory function and renal function improves.  Barriers For  Discharge : Active symptoms  Consults  :  None  Procedures  : None  DVT Prophylaxis  :  Lovenox   Lab Results  Component Value Date   PLT 220 01/17/2017    Antibiotics  :    Anti-infectives    Start     Dose/Rate Route Frequency Ordered Stop   01/14/17 2000  azithromycin (ZITHROMAX) 500 mg in dextrose 5 % 250 mL IVPB     500 mg 250 mL/hr over 60 Minutes Intravenous Every 24 hours 01/13/17 2340     01/14/17 1800  cefTRIAXone (ROCEPHIN) 1 g in dextrose 5 % 50 mL IVPB    Comments:  Tolerating Rocephin at [facility] prior to transfer   1 g 100 mL/hr over 30 Minutes Intravenous Every 24 hours 01/13/17 2340          Objective:   Vitals:   01/16/17 1437 01/16/17 2014 01/17/17 0429 01/17/17 0635  BP:  110/70  108/61  Pulse:    61  Resp:      Temp:    97.4 F (36.3 C)  TempSrc:    Oral  SpO2: 90% 96% 96% 97%  Weight:    128.3 kg (282 lb 14.4 oz)  Height:        Wt Readings from Last 3 Encounters:  01/17/17 128.3 kg (282 lb 14.4 oz)  01/04/15 122.5 kg (270 lb)  06/19/14 127 kg (280 lb)     Intake/Output Summary (Last 24 hours) at 01/17/17 0836 Last data filed at 01/17/17 0600  Gross per 24 hour  Intake             2350 ml  Output             1550 ml  Net              800 ml     Physical Exam Gen.: Morbidly obese, anxious and tearful during conversation HEENT: Moist mucosa, supple neck Chest: Bilateral scattered rhonchi CVS: Normal S1 and S2, no murmurs GI: Soft, nondistended, nontender Muscles culture: Warm, no edema, lumbar spine tenderness      Data Review:    CBC  Recent Labs Lab 01/14/17 0039 01/15/17 0229 01/16/17 0259 01/17/17 0347  WBC 26.7* 27.8* 24.3* 22.4*  HGB 13.5  14.9 14.4 14.0  HCT 41.7 45.3 46.8 43.5  PLT 241 255 245 220  MCV 101.2* 101.6* 104.0* 101.2*  MCH 32.8 33.4 32.0 32.6  MCHC 32.4 32.9 30.8 32.2  RDW 14.9 15.1 15.3 14.7  LYMPHSABS 1.6  --   --   --   MONOABS 1.3*  --   --   --   EOSABS 0.0  --   --   --   BASOSABS 0.0  --   --   --     Chemistries   Recent Labs Lab 01/14/17 0039 01/15/17 0229 01/16/17 0259 01/17/17 0347  NA 137 137 134* 132*  K 3.8 4.4 5.0 5.3*  CL 106 103 99* 101  CO2 22 23 23  19*  GLUCOSE 224* 122* 99 109*  BUN 18 26* 48* 75*  CREATININE 1.10 1.18 2.26* 2.44*  CALCIUM 8.7* 9.5 9.3 8.7*  AST 61* 63* 76* 69*  ALT 99* 104* 105* 100*  ALKPHOS 52 53 55 53  BILITOT 0.2* 0.5 0.6 0.7   ------------------------------------------------------------------------------------------------------------------ No results for input(s): CHOL, HDL, LDLCALC, TRIG, CHOLHDL, LDLDIRECT in the last 72 hours.  Lab Results  Component Value Date   HGBA1C 5.7 (H) 01/14/2017   ------------------------------------------------------------------------------------------------------------------  No results for input(s): TSH, T4TOTAL, T3FREE, THYROIDAB in the last 72 hours.  Invalid input(s): FREET3 ------------------------------------------------------------------------------------------------------------------ No results for input(s): VITAMINB12, FOLATE, FERRITIN, TIBC, IRON, RETICCTPCT in the last 72 hours.  Coagulation profile  Recent Labs Lab 01/14/17 0039  INR 1.04    No results for input(s): DDIMER in the last 72 hours.  Cardiac Enzymes No results for input(s): CKMB, TROPONINI, MYOGLOBIN in the last 168 hours.  Invalid input(s): CK ------------------------------------------------------------------------------------------------------------------ No results found for: BNP  Inpatient Medications  Scheduled Meds: . aspirin EC  325 mg Oral Daily  . atenolol  50 mg Oral Daily  . diltiazem  240 mg Oral Daily  .  enoxaparin (LOVENOX) injection  40 mg Subcutaneous QHS  . insulin aspart  0-15 Units Subcutaneous TID WC  . ipratropium-albuterol  3 mL Nebulization TID  . Melatonin  4.5 mg Oral QHS  . methylPREDNISolone (SOLU-MEDROL) injection  60 mg Intravenous Q12H  . mometasone-formoterol  2 puff Inhalation BID  . morphine  30 mg Oral Q12H  . multivitamin with minerals  1 tablet Oral Daily  . OLANZapine  2.5 mg Oral Daily  . pantoprazole  40 mg Oral BID AC  . sodium chloride flush  3 mL Intravenous Q12H  . vitamin B-12  1,000 mcg Oral Daily   Continuous Infusions: . sodium chloride    . azithromycin Stopped (01/16/17 2259)  . cefTRIAXone (ROCEPHIN)  IV Stopped (01/16/17 1744)   PRN Meds:.sodium chloride, acetaminophen **OR** acetaminophen, alum & mag hydroxide-simeth, bisacodyl, chlorpheniramine-HYDROcodone, diphenhydrAMINE, guaiFENesin-dextromethorphan, HYDROmorphone, ipratropium-albuterol, LORazepam, ondansetron **OR** ondansetron (ZOFRAN) IV, oxyCODONE, polyethylene glycol, sodium chloride flush  Micro Results No results found for this or any previous visit (from the past 240 hour(s)).  Radiology Reports Dg Chest Port 1 View  Result Date: 01/14/2017 CLINICAL DATA:  Acute onset of shortness of breath and wheezing. Initial encounter. EXAM: PORTABLE CHEST 1 VIEW COMPARISON:  Chest radiograph performed 01/12/2017 FINDINGS: The lungs are well-aerated. Mild bibasilar airspace opacities raise concern for pneumonia. There is no evidence of pleural effusion or pneumothorax. The cardiomediastinal silhouette is within normal limits. No acute osseous abnormalities are seen. IMPRESSION: Mild bibasilar airspace opacities raise concern for pneumonia. Electronically Signed   By: Garald Balding M.D.   On: 01/14/2017 00:56    Time Spent in minutes  41   Oronde Hallenbeck M.D PhD on 01/17/2017 at 8:36 AM  Between 7am to 7pm - Pager - [phone]  After 7pm go to [email or web address] - password [facility]  [facility] -   Office  [phone]

## 2017-01-18 ENCOUNTER — Inpatient Hospital Stay (HOSPITAL_COMMUNITY): Payer: Medicare Other

## 2017-01-18 DIAGNOSIS — M79609 Pain in unspecified limb: Secondary | ICD-10-CM

## 2017-01-18 LAB — COMPREHENSIVE METABOLIC PANEL
ALK PHOS: 62 U/L (ref 38–126)
ALT: 95 U/L — ABNORMAL HIGH (ref 17–63)
ANION GAP: 11 (ref 5–15)
AST: 66 U/L — ABNORMAL HIGH (ref 15–41)
Albumin: 3.5 g/dL (ref 3.5–5.0)
BUN: 63 mg/dL — ABNORMAL HIGH (ref 6–20)
CALCIUM: 9 mg/dL (ref 8.9–10.3)
CO2: 21 mmol/L — AB (ref 22–32)
Chloride: 104 mmol/L (ref 101–111)
Creatinine, Ser: 1.53 mg/dL — ABNORMAL HIGH (ref 0.61–1.24)
GFR calc non Af Amer: 51 mL/min — ABNORMAL LOW (ref >60)
GFR, EST AFRICAN AMERICAN: 60 mL/min — AB (ref >60)
Glucose, Bld: 127 mg/dL — ABNORMAL HIGH (ref 65–99)
Potassium: 4.9 mmol/L (ref 3.5–5.1)
SODIUM: 136 mmol/L (ref 135–145)
TOTAL PROTEIN: 6.3 g/dL — AB (ref 6.5–8.1)
Total Bilirubin: 0.8 mg/dL (ref 0.3–1.2)

## 2017-01-18 LAB — LIPID PANEL
Cholesterol: 189 mg/dL (ref 0–200)
HDL: 40 mg/dL — ABNORMAL LOW (ref >40)
LDL CALC: 111 mg/dL — AB (ref 0–99)
TRIGLYCERIDES: 188 mg/dL — AB (ref <150)
Total CHOL/HDL Ratio: 4.7 RATIO
VLDL: 38 mg/dL (ref 0–40)

## 2017-01-18 LAB — GLUCOSE, CAPILLARY
GLUCOSE-CAPILLARY: 177 mg/dL — AB (ref 65–99)
Glucose-Capillary: 198 mg/dL — ABNORMAL HIGH (ref 65–99)
Glucose-Capillary: 123 mg/dL — ABNORMAL HIGH (ref 65–99)
Glucose-Capillary: 157 mg/dL — ABNORMAL HIGH (ref 65–99)
Glucose-Capillary: 148 mg/dL — ABNORMAL HIGH (ref 65–99)

## 2017-01-18 LAB — CBC
HCT: 42.3 % (ref 39.0–52.0)
HEMOGLOBIN: 14.4 g/dL (ref 13.0–17.0)
MCH: 33.6 pg (ref 26.0–34.0)
MCHC: 34 g/dL (ref 30.0–36.0)
MCV: 98.6 fL (ref 78.0–100.0)
Platelets: 220 10*3/uL (ref 150–400)
RBC: 4.29 MIL/uL (ref 4.22–5.81)
RDW: 14.6 % (ref 11.5–15.5)
WBC: 19.3 10*3/uL — ABNORMAL HIGH (ref 4.0–10.5)

## 2017-01-18 LAB — PSA: Prostatic Specific Antigen: 0.23 ng/mL (ref 0.00–4.00)

## 2017-01-18 LAB — RPR: RPR Ser Ql: NONREACTIVE

## 2017-01-18 MED ORDER — PREDNISONE 50 MG PO TABS
60.0000 mg | ORAL_TABLET | Freq: Every day | ORAL | Status: AC
  Administered 2017-01-19: 06:00:00 60 mg via ORAL
  Filled 2017-01-18: qty 1

## 2017-01-18 MED ORDER — PREDNISONE 20 MG PO TABS: 40.0000 mg | ORAL_TABLET | Freq: Every day | ORAL | Status: DC

## 2017-01-18 MED ORDER — VITAMIN B-1 100 MG PO TABS
100.0000 mg | ORAL_TABLET | Freq: Every day | ORAL | Status: DC
  Administered 2017-01-19 – 2017-01-21 (×3): 100 mg via ORAL
  Filled 2017-01-18 (×3): qty 1

## 2017-01-18 MED ORDER — PREDNISONE 50 MG PO TABS: 50.0000 mg | ORAL_TABLET | Freq: Every day | ORAL | Status: DC

## 2017-01-18 MED ORDER — AZITHROMYCIN 500 MG PO TABS
500.0000 mg | ORAL_TABLET | ORAL | Status: AC
  Administered 2017-01-18 – 2017-01-19 (×2): 500 mg via ORAL
  Filled 2017-01-18 (×2): qty 1

## 2017-01-18 NOTE — Progress Notes (Signed)
VASCULAR LAB PRELIMINARY  PRELIMINARY  PRELIMINARY  PRELIMINARY  Left lower extremity venous duplex completed.    Preliminary report:  There is no DVT or SVT noted in the left lower extremity.   Emmary Culbreath, RVT 01/18/2017, 5:23 PM

## 2017-01-18 NOTE — Progress Notes (Signed)
PROGRESS NOTE                                                                                                                                                                                                             Patient Demographics:    Jeremy Mcgee, is a 51 y.o. male, DOB - Mar 09, 1966, EXH:[id number]  Admit date - 01/13/2017   Admitting Physician Vianne Bulls, MD  Outpatient Primary MD for the patient is Patient, No Pcp Per  LOS - 5  Outpatient Specialists: Dr Byrd Hesselbach , orthopedic surgeon in [place]  No chief complaint on file.      Brief Narrative   51 year old morbidly obese male with COPD with chronic hypoxic respiratory failure, alcoholic cirrhosis without ascites (no active alcohol use), coronary artery disease, hypertension, chronic low back pain with radiculopathy, bilateral knee pain was sent from [facility] where he was admitted on 6/17 with acute exacerbation of COPD and worsening chronic back pain with recurrent falls over the past few months due to bilateral lower extremity weakness.  Patient reports chronic low back pain for many years and was following with Dr. Pascal Lux who is an orthopedic surgeon in [place]. He had MRI of the lumbar spine done in early 2017 showing lateral recess narrowing of left L2-L3 and left foraminal stenosis of L4-L5. He has been treated with pain medications and multiple steroid injections. He also follows with pain management and is being treated with long-acting morphine and oxycodone IR. He informs his lower extremity weakness to be bilateral and present for past 3-4 months. He has experienced buckling of his knees causing multiple falls. No head injury or loss of consciousness. He informs the pain radiates down to his legs but denies any bowel or urinary incontinence/retention. Patient was last seen by his orthopedics in March when he received steroid injection to  the knees. He was planned to have MRI of his back and knees but due to severe claustrophobia this was not possible.  Patient was then admitted to [facility] for acute exacerbation of COPD and ongoing severe low back pain with bilateral leg weakness. Blood work done showed leukocytosis. B12 was normal. Patient was being treated with Solu-Medrol and nebs along with empiric antibiotics. Patient was unable to tolerate MRI of his low back due to severe claustrophobia. Since he would need an open  MRI or done under sedation followed by possible neurosurgical consultation he was transferred to [facility].    Subjective:  Wbc and cr improving, urine output 1.4liter, he did not require any sedatives lase night agitation seems has resolved, none last night or this morning, confusion has largely resolved, now aaox3 No cough noticed during encounter,  Sitter in room, sitter reports patient ambulated this am   Assessment  & Plan :    Principal Problem:  Metabolic encephalopathy;  -patient was very agitated and confused on 6/22, he was barely able to stay awake during conversation on 6/22 -Not sure last drink, could be from alcohol withdrawal? From uncontrolled infection? Hypoxia? - abg no co2 retention, does has hypoxia,  ammonia, rpr,  tsh unremarkable, b1 level pending, blood culture no growth, ua no infection,. - Start alcohol withdrawal protocol - Start antipsychotic with zyprexa, sitter in room,  For fall precaution, cpap ordered as well suspect component of underline sleep apnea   COPD With acute exacerbation ([place]), on home o2 --Has chronic hypoxic respiratory failure and centrilobular emphysema. On 2-3 L oxygen at home.  -Continue current dose of IV Solu-Medrol, continue scheduled meds, empiric antibiotics zithromax/ropcephin since admission, supplemental oxygen, antitussives. --Being counseled on smoking cessation. Continue nicotine patch.  -seems improving, taper steroids  Leukocytosis    -cxr on admission with "Mild bibasilar airspace opacities raise concern for pneumonia.", blood culture no growth, ua no infection  -steroid and dehydration could also contribute, he is Already on abx, he received ivf, steroids is being taperred. -monitor cbc  Acute kidney injury -ua no infection, renal ultrasound , no acute findings -Suspect prerenal with dehydration versus ATN due to NSAID use. Patient also received her dose of IV Lasix initially and has been on ACE inhibitor. Discontinue all offending agents , he is started on  gentle hydration.  -cr peaked at 2.44, improving,  today 1,53, stop ivf -Repeat bmp in am  Active Problems: Chronic low back pain with bilateral lower extremity weakness --Suspect lumbar radiculopathy . MRI done in January 2017 (see care everywhere) shows lumbar stenosis. Patient reports multiple falls at home and is very weak on PT evaluation. No clinical signs of cord compression. -MRI of the lumbar spine plan under conscious sedation but patient was have to be intubated given his current respiratory status. (See subjective for details).  -get mri once  acute COPD exacerbation resolved and patient respiratory status improved back to baseline where is able to light on flat and tolerated conscious sedation. He can be discharged to SNF in 1-2 days and have MRI arranged either by his orthopedic surgeon in [place] or from SNF. -His home pain medications have been resumed which include MS Contin and OxyIR. Also added when necessary Dilaudid . discontinue NSAIDs given worsened renal function. -he is able to ambulate today on 6/23    Left lower extremity pitting edema: venous doppler negative for DVT, D/v IVF.   Alcoholic cirrhosis of liver without ascites, ([place]) with h/o hepc in 2015 -Has transaminitis which seems chronic ( noted on prior labs).  Tylenol and salicylate levels are normal. Ammonia level elevated on 6/19, . INR normal. abdominal ultrasound done at outside  hospital shows chr hepatocellular disease. LFTs improving. Ammonia level normalized.   Hyperglycemia with prediabetes A1C of  5.7  Not on meds at home, elevated blood sugar Secondary to steroid use. Monitor on sliding scale coverage. Taper steroids  CAD (coronary artery disease) Continue aspirin,  I have reviewed record in care  everywhere, he had a cardiac cath done  in 2016 which was unremarkable   Essential hypertension. Stable. Continue betablocker,  hold lisinopril due to arf. I donot see cardizem on his home meds, his blood pressure is low normal, will d/c cardizem   Morbid obesity with? OHS Body mass index is 43.29 kg/m. Start nightly cpap here, will need outpatient sleep study Needs counseling on diet and exercise  Patient is now living in [facility] and would benefit from seeing a pulmonologist in that area.   Vitamin B12 deficiency Added supplement.  Code Status : Full code  Family Communication  : None at bedside  Disposition Plan  : SNF , pending mental status,  respiratory function and renal function improvement.  Barriers For Discharge : Active symptoms  Consults  :  None  Procedures  : None  DVT Prophylaxis  :  Lovenox   Lab Results  Component Value Date   PLT 220 01/18/2017    Antibiotics  :    Anti-infectives    Start     Dose/Rate Route Frequency Ordered Stop   01/14/17 2000  azithromycin (ZITHROMAX) 500 mg in dextrose 5 % 250 mL IVPB     500 mg 250 mL/hr over 60 Minutes Intravenous Every 24 hours 01/13/17 2340     01/14/17 1800  cefTRIAXone (ROCEPHIN) 1 g in dextrose 5 % 50 mL IVPB    Comments:  Tolerating Rocephin at [facility] prior to transfer   1 g 100 mL/hr over 30 Minutes Intravenous Every 24 hours 01/13/17 2340          Objective:   Vitals:   01/17/17 1417 01/17/17 2015 01/17/17 2044 01/18/17 0607  BP:  (!) 97/55  129/75  Pulse:  64  68  Resp:  16  16  Temp:  97.6 F (36.4 C)  98.9 F (37.2 C)  TempSrc:  Oral  Oral    SpO2: 95% 96% 96% 99%  Weight:    129.1 kg (284 lb 11.2 oz)  Height:        Wt Readings from Last 3 Encounters:  01/18/17 129.1 kg (284 lb 11.2 oz)  01/04/15 122.5 kg (270 lb)  06/19/14 127 kg (280 lb)     Intake/Output Summary (Last 24 hours) at 01/18/17 0749 Last data filed at 01/18/17 0237  Gross per 24 hour  Intake              360 ml  Output             1450 ml  Net            -1090 ml     Physical Exam Gen.: Morbidly obese, look much better today, confusion and agitation has almost resolved, aaox3 and very appreciative to the care he has received here this am HEENT: Moist mucosa, supple neck Chest: Bilateral scattered rhonchi, overall diminished breath sounds, no significant weehzing CVS: Normal S1 and S2, no murmurs GI: Soft, nondistended, nontender Muscles culture: lumbar spine tenderness, left lower extremity pitting edema      Data Review:    CBC  Recent Labs Lab 01/14/17 0039 01/15/17 0229 01/16/17 0259 01/17/17 0347 01/18/17 0452  WBC 26.7* 27.8* 24.3* 22.4* 19.3*  HGB 13.5 14.9 14.4 14.0 14.4  HCT 41.7 45.3 46.8 43.5 42.3  PLT 241 255 245 220 220  MCV 101.2* 101.6* 104.0* 101.2* 98.6  MCH 32.8 33.4 32.0 32.6 33.6  MCHC 32.4 32.9 30.8 32.2 34.0  RDW 14.9 15.1 15.3 14.7 14.6  LYMPHSABS 1.6  --   --   --   --   MONOABS 1.3*  --   --   --   --   EOSABS 0.0  --   --   --   --   BASOSABS 0.0  --   --   --   --     Chemistries   Recent Labs Lab 01/14/17 0039 01/15/17 0229 01/16/17 0259 01/17/17 0347 01/18/17 0452  NA 137 137 134* 132* 136  K 3.8 4.4 5.0 5.3* 4.9  CL 106 103 99* 101 104  CO2 22 23 23  19* 21*  GLUCOSE 224* 122* 99 109* 127*  BUN 18 26* 48* 75* 63*  CREATININE 1.10 1.18 2.26* 2.44* 1.53*  CALCIUM 8.7* 9.5 9.3 8.7* 9.0  AST 61* 63* 76* 69* 66*  ALT 99* 104* 105* 100* 95*  ALKPHOS 52 53 55 53 62  BILITOT 0.2* 0.5 0.6 0.7 0.8    ------------------------------------------------------------------------------------------------------------------  Recent Labs  01/18/17 0452  CHOL 189  HDL 40*  LDLCALC 111*  TRIG 188*  CHOLHDL 4.7    Lab Results  Component Value Date   HGBA1C 5.7 (H) 01/14/2017   ------------------------------------------------------------------------------------------------------------------  Recent Labs  01/17/17 1153  TSH 0.380   ------------------------------------------------------------------------------------------------------------------ No results for input(s): VITAMINB12, FOLATE, FERRITIN, TIBC, IRON, RETICCTPCT in the last 72 hours.  Coagulation profile  Recent Labs Lab 01/14/17 0039  INR 1.04    No results for input(s): DDIMER in the last 72 hours.  Cardiac Enzymes No results for input(s): CKMB, TROPONINI, MYOGLOBIN in the last 168 hours.  Invalid input(s): CK ------------------------------------------------------------------------------------------------------------------ No results found for: BNP  Inpatient Medications  Scheduled Meds: . aspirin EC  325 mg Oral Daily  . atenolol  50 mg Oral Daily  . enoxaparin (LOVENOX) injection  40 mg Subcutaneous QHS  . folic acid  1 mg Oral Daily  . insulin aspart  0-15 Units Subcutaneous TID WC  . ipratropium-albuterol  3 mL Nebulization TID  . Melatonin  4.5 mg Oral QHS  . methylPREDNISolone (SOLU-MEDROL) injection  60 mg Intravenous Q12H  . mometasone-formoterol  2 puff Inhalation BID  . morphine  30 mg Oral Q12H  . multivitamin with minerals  1 tablet Oral Daily  . OLANZapine  2.5 mg Oral Daily  . pantoprazole  40 mg Oral BID AC  . sodium chloride flush  3 mL Intravenous Q12H  . thiamine  100 mg Oral Daily   Or  . thiamine  100 mg Intravenous Daily  . vitamin B-12  1,000 mcg Oral Daily   Continuous Infusions: . sodium chloride    . sodium chloride 75 mL/hr at 01/18/17 0237  . azithromycin Stopped  (01/17/17 2223)  . cefTRIAXone (ROCEPHIN)  IV Stopped (01/18/17 0544)   PRN Meds:.sodium chloride, acetaminophen **OR** acetaminophen, alum & mag hydroxide-simeth, bisacodyl, chlorpheniramine-HYDROcodone, diphenhydrAMINE, guaiFENesin-dextromethorphan, HYDROmorphone, ipratropium-albuterol, LORazepam **OR** LORazepam, ondansetron **OR** ondansetron (ZOFRAN) IV, oxyCODONE, polyethylene glycol, sodium chloride flush  Micro Results No results found for this or any previous visit (from the past 240 hour(s)).  Radiology Reports US Renal  Result Date: 01/17/2017 CLINICAL DATA:  Elevated creatinine, history hypertension, asthma, COPD, coronary artery disease, alcoholic cirrhosis EXAM: RENAL / URINARY TRACT ULTRASOUND COMPLETE COMPARISON:  CT abdomen and pelvis 01/06/2016, abdomen ultrasound 01/13/2017 FINDINGS: Right Kidney: Length: 10.9 cm. Normal cortical thickness and echogenicity. No mass, hydronephrosis or shadowing calcification. Left Kidney: Length: 11.6 cm. Normal cortical thickness and echogenicity. No mass, hydronephrosis or shadowing calcification. Bladder: Partially distended, unremarkable.  IMPRESSION: No sonographic abnormalities of the urinary tract identified. Electronically Signed   By: Lavonia Dana M.D.   On: 01/17/2017 16:51   Dg Chest Port 1 View  Result Date: 01/14/2017 CLINICAL DATA:  Acute onset of shortness of breath and wheezing. Initial encounter. EXAM: PORTABLE CHEST 1 VIEW COMPARISON:  Chest radiograph performed 01/12/2017 FINDINGS: The lungs are well-aerated. Mild bibasilar airspace opacities raise concern for pneumonia. There is no evidence of pleural effusion or pneumothorax. The cardiomediastinal silhouette is within normal limits. No acute osseous abnormalities are seen. IMPRESSION: Mild bibasilar airspace opacities raise concern for pneumonia. Electronically Signed   By: Garald Balding M.D.   On: 01/14/2017 00:56    Time Spent in minutes  73   Emerita Berkemeier M.D PhD on 01/18/2017  at 7:49 AM  Between 7am to 7pm - Pager - [phone]  After 7pm go to [email or web address] - password [facility]  [facility] -  Office  [phone]

## 2017-01-18 NOTE — Progress Notes (Signed)
Pt refuses CPAP 

## 2017-01-18 NOTE — Plan of Care (Signed)
Problem: Fluid Volume: Goal: Ability to maintain a balanced intake and output will improve Outcome: Progressing Patient is tolerating PO intake well, urine output has increased.  Patient progressing towards goal.

## 2017-01-19 DIAGNOSIS — E662 Morbid (severe) obesity with alveolar hypoventilation: Secondary | ICD-10-CM

## 2017-01-19 DIAGNOSIS — G4733 Obstructive sleep apnea (adult) (pediatric): Secondary | ICD-10-CM

## 2017-01-19 DIAGNOSIS — M5440 Lumbago with sciatica, unspecified side: Secondary | ICD-10-CM

## 2017-01-19 DIAGNOSIS — G8929 Other chronic pain: Secondary | ICD-10-CM

## 2017-01-19 DIAGNOSIS — I2510 Atherosclerotic heart disease of native coronary artery without angina pectoris: Secondary | ICD-10-CM

## 2017-01-19 DIAGNOSIS — K7030 Alcoholic cirrhosis of liver without ascites: Secondary | ICD-10-CM

## 2017-01-19 DIAGNOSIS — J441 Chronic obstructive pulmonary disease with (acute) exacerbation: Principal | ICD-10-CM

## 2017-01-19 DIAGNOSIS — N179 Acute kidney failure, unspecified: Secondary | ICD-10-CM

## 2017-01-19 DIAGNOSIS — R29898 Other symptoms and signs involving the musculoskeletal system: Secondary | ICD-10-CM

## 2017-01-19 DIAGNOSIS — Z720 Tobacco use: Secondary | ICD-10-CM

## 2017-01-19 DIAGNOSIS — I10 Essential (primary) hypertension: Secondary | ICD-10-CM

## 2017-01-19 LAB — CBC WITH DIFFERENTIAL/PLATELET
BASOS PCT: 0 %
Basophils Absolute: 0 10*3/uL (ref 0.0–0.1)
EOS ABS: 0 10*3/uL (ref 0.0–0.7)
Eosinophils Relative: 0 %
HEMATOCRIT: 44.8 % (ref 39.0–52.0)
Hemoglobin: 14.9 g/dL (ref 13.0–17.0)
Lymphocytes Relative: 10 %
Lymphs Abs: 2.1 10*3/uL (ref 0.7–4.0)
MCH: 33.3 pg (ref 26.0–34.0)
MCHC: 33.3 g/dL (ref 30.0–36.0)
MCV: 100 fL (ref 78.0–100.0)
MONOS PCT: 7 %
Monocytes Absolute: 1.4 10*3/uL — ABNORMAL HIGH (ref 0.1–1.0)
NEUTROS ABS: 17.1 10*3/uL — AB (ref 1.7–7.7)
Neutrophils Relative %: 83 %
Platelets: 216 10*3/uL (ref 150–400)
RBC: 4.48 MIL/uL (ref 4.22–5.81)
RDW: 14.7 % (ref 11.5–15.5)
WBC: 20.6 10*3/uL — ABNORMAL HIGH (ref 4.0–10.5)

## 2017-01-19 LAB — COMPREHENSIVE METABOLIC PANEL
ALBUMIN: 3.3 g/dL — AB (ref 3.5–5.0)
ALT: 113 U/L — ABNORMAL HIGH (ref 17–63)
ANION GAP: 8 (ref 5–15)
AST: 79 U/L — ABNORMAL HIGH (ref 15–41)
Alkaline Phosphatase: 50 U/L (ref 38–126)
BILIRUBIN TOTAL: 0.8 mg/dL (ref 0.3–1.2)
BUN: 43 mg/dL — ABNORMAL HIGH (ref 6–20)
CO2: 25 mmol/L (ref 22–32)
Calcium: 9.2 mg/dL (ref 8.9–10.3)
Chloride: 105 mmol/L (ref 101–111)
Creatinine, Ser: 1.25 mg/dL — ABNORMAL HIGH (ref 0.61–1.24)
GFR calc non Af Amer: >60 mL/min (ref >60)
Glucose, Bld: 133 mg/dL — ABNORMAL HIGH (ref 65–99)
POTASSIUM: 4.7 mmol/L (ref 3.5–5.1)
Sodium: 138 mmol/L (ref 135–145)
TOTAL PROTEIN: 6 g/dL — AB (ref 6.5–8.1)

## 2017-01-19 LAB — GLUCOSE, CAPILLARY
GLUCOSE-CAPILLARY: 143 mg/dL — AB (ref 65–99)
GLUCOSE-CAPILLARY: 118 mg/dL — AB (ref 65–99)
GLUCOSE-CAPILLARY: 116 mg/dL — AB (ref 65–99)
Glucose-Capillary: 119 mg/dL — ABNORMAL HIGH (ref 65–99)

## 2017-01-19 MED ORDER — MORPHINE SULFATE ER 15 MG PO TBCR
15.0000 mg | EXTENDED_RELEASE_TABLET | Freq: Every morning | ORAL | Status: DC
  Administered 2017-01-20: 15 mg via ORAL
  Filled 2017-01-19: qty 1

## 2017-01-19 MED ORDER — PREDNISONE 5 MG/5ML PO SOLN
20.0000 mg | Freq: Two times a day (BID) | ORAL | Status: DC
  Administered 2017-01-20 – 2017-01-21 (×3): 20 mg via ORAL
  Filled 2017-01-19 (×4): qty 20

## 2017-01-19 MED ORDER — MORPHINE SULFATE ER 30 MG PO TBCR
30.0000 mg | EXTENDED_RELEASE_TABLET | Freq: Every day | ORAL | Status: DC
  Administered 2017-01-19 – 2017-01-20 (×2): 30 mg via ORAL
  Filled 2017-01-19 (×2): qty 1

## 2017-01-19 NOTE — Progress Notes (Signed)
Pt still needs a sitter, since patient is same in his condition. RN not giving him whole lot of pain medicines, pt is drowsy most of the time, on and off from bed to chair, restless and anxious and pleasant sometimes.IVABX continue, will continue to monitor the patient

## 2017-01-19 NOTE — Progress Notes (Signed)
Pt is currently asleep, no impulsiveness.

## 2017-01-19 NOTE — Progress Notes (Signed)
Pt states does not have a home to go to when pt is able to be d/c from the hospital, may need social services to help with this, Thanks Arvella Nigh RN

## 2017-01-19 NOTE — Progress Notes (Signed)
PROGRESS NOTE    Jeremy Mcgee  TKZ:[id number] DOB: Nov 11, 1965 DOA: 01/13/2017 PCP: Patient, No Pcp Per     Brief Narrative:  51 year old WM PMHx Morbidly Obese COPD with Chronic Hypoxic respiratory failure(2-3 L oxygen at home.), EtOH liver cirrhosis without ascites (no active alcohol use), CAD, HTN, Chronic Low back pain with radiculopathy, Bilateral knee pain   Was sent from [facility] where he was admitted on 6/17 with acute exacerbation of COPD and worsening chronic back pain with recurrent falls over the past few months due to bilateral lower extremity weakness.  Patient reports chronic low back pain for many years and was following with Dr. Pascal Lux who is an orthopedic surgeon in [place]. He had MRI of the lumbar spine done in early 2017 showing lateral recess narrowing of left L2-L3 and left foraminal stenosis of L4-L5. He has been treated with pain medications and multiple steroid injections. He also follows with pain management and is being treated with long-acting morphine and oxycodone IR. He informs his lower extremity weakness to be bilateral and present for past 3-4 months. He has experienced buckling of his knees causing multiple falls. No head injury or loss of consciousness. He informs the pain radiates down to his legs but denies any bowel or urinary incontinence/retention. Patient was last seen by his orthopedics in March when he received steroid injection to the knees. He was planned to have MRI of his back and knees but due to severe claustrophobia this was not possible.  Patient was then admitted to [facility] for acute exacerbation of COPD and ongoing severe low back pain with bilateral leg weakness. Blood work done showed leukocytosis. B12 was normal. Patient was being treated with Solu-Medrol and nebs along with empiric antibiotics. Patient was unable to tolerate MRI of his low back due to severe claustrophobia. Since he would need an open MRI or done under sedation  followed by possible neurosurgical consultation he was transferred to [facility].    Subjective: 6/24 patient with slurred speech, falls asleep mid sentence, if you engage patient A/O 4. Negative acute SOB, negative CP, continues to smoke while on oxygen at home, negative N/V.   Assessment & Plan:   Principal Problem:   COPD exacerbation ([place]) Active Problems:   CAD (coronary artery disease)   Tobacco abuse   Alcoholic cirrhosis of liver without ascites (HCC)   Chronic back pain   Clonus   COPD with acute exacerbation (HCC)   Essential hypertension   Hyperglycemia   Pneumonia   Metabolic encephalopathy/ ;  -patient slurred speech, barely able to stay awake during conversation on 6/24. Encephalopathy vs overuse narcotics vs CO2 retention. -DC Dilaudid -Decrease MS Contin to 30 mg QPm; 15 mg QAm -Continue OxyIR 5 mg PRN -Discontinue CIWA patient has been hospitalized 7 days outside window for withdrawal -Safety sitter in room   COPD With acute exacerbation ([place]), on home o2 --Has chronic hypoxic respiratory failure and centrilobular emphysema. On 2-3 L oxygen at home.  -Titrate O2 to maintain SPO2 89-93% -Decrease prednisone to home regimen 20 mg BID -Complete 5 day course antibiotics  OSA/OHS? -CPAP QHS -Obtain ABG OSA/OHS? In [place]  -Need outpatient sleep study  Tobacco abuse -Continues to smoke while on oxygen -Counseled patient on sequela of continuing to smoke to include increased risk CVA, MI, death by fire -Nicotine patch  Leukocytosis  -cxr on admission with "Mild bibasilar airspace opacities raise concern for pneumonia.", blood culture no growth, ua no infection  -patient afebrile, negative  bands, completed 5 day course antibiotics. Monitor off antibiotics -Patient on chronic steroids which could be contributing.  Acute kidney injury -ua no infection, renal ultrasound , no acute findings -Suspect prerenal with dehydration versus ATN due to NSAID  use. Patient also received her dose of IV Lasix initially and has been on ACE inhibitor.  -Discontinue all offending agents ,  Lab Results  Component Value Date   CREATININE 1.25 (H) 01/19/2017   CREATININE 1.53 (H) 01/18/2017   CREATININE 2.44 (H) 01/17/2017  -Resolved  Chronic low back pain with bilateral lower extremity weakness --Suspect lumbar radiculopathy . MRI done in January 2017 (see care everywhere) shows lumbar stenosis. Patient reports multiple falls at home and is very weak on PT evaluation. No clinical signs of cord compression. -MRI of the lumbar spine plan under conscious sedation but patient was have to be intubated given his current respiratory status. (See subjective for details).  -Ensure patient obtains MRI under conscious sedation on Monday 6/25. Patient specifically admitted here from outside hospital for this study.    Left lower extremity pitting edema:  -venous doppler negative for DVT, D/v IVF.   Alcoholic cirrhosis of liver without ascites, ([place]) with h/o hepc in 2015 -Has transaminitis which seems chronic ( noted on prior labs).  Tylenol and salicylate levels are normal. Ammonia level elevated on 6/19, . INR normal.  -abdominal ultrasound done at outside hospital shows chr hepatocellular disease. LFTs improving.  -Ammonia level normalized.   Hyperglycemia with prediabetes  -6/19 Hemoglobin A1c=  5.7  -Moderate SSI   CAD (coronary artery disease) -Continue aspirin,   Essential hypertension. -Stable.  -Continue betablocker,   -hold lisinopril due to arf. I donot see cardizem on his home meds, his blood pressure is low normal, will d/c cardizem  Morbid obesity with? OHS -Body mass index is 43.29 kg/m.     DVT prophylaxis: Lovenox Code Status: Full Family Communication: None Disposition Plan: TBD following MRI L-spine   Consultants:  None  Procedures/Significant Events:  6/23 left lower extremity Doppler: Negative  DVT/SVT   VENTILATOR SETTINGS: Negative   Cultures 6/19 HIV negative 6/22 RPR negative 6/22 blood NGTD     Antimicrobials: Anti-infectives    Start     Stop   01/18/17 2000  azithromycin (ZITHROMAX) tablet 500 mg     01/19/17 2359   01/14/17 2000  azithromycin (ZITHROMAX) 500 mg in dextrose 5 % 250 mL IVPB  Status:  Discontinued     01/18/17 1327   01/14/17 1800  cefTRIAXone (ROCEPHIN) 1 g in dextrose 5 % 50 mL IVPB    Comments:  Tolerating Rocephin at [place] prior to transfer   01/19/17 2359      Devices None   LINES / TUBES:  None    Continuous Infusions: . sodium chloride    . cefTRIAXone (ROCEPHIN)  IV Stopped (01/18/17 1749)     Objective: Vitals:   01/19/17 0023 01/19/17 0448 01/19/17 0746 01/19/17 0753  BP: 139/89 138/84    Pulse: 72 70    Resp: 16 20    Temp: 98.6 F (37 C) 98 F (36.7 C)    TempSrc: Oral Oral    SpO2: 97% 100% 98% 98%  Weight:  279 lb 12.8 oz (126.9 kg)    Height:        Intake/Output Summary (Last 24 hours) at 01/19/17 0818 Last data filed at 01/19/17 0600  Gross per 24 hour  Intake  2854.5 ml  Output             1553 ml  Net           1301.5 ml   Filed Weights   01/17/17 0635 01/18/17 0607 01/19/17 0448  Weight: 282 lb 14.4 oz (128.3 kg) 284 lb 11.2 oz (129.1 kg) 279 lb 12.8 oz (126.9 kg)    Examination:  General: Slurred speech, falls asleep mid sentence, A/O 4 when awake, positive acute on chronic respiratory distress Eyes: negative scleral hemorrhage, negative anisocoria, negative icterus ENT: Negative Runny nose, negative gingival bleeding, Neck:  Negative scars, masses, torticollis, lymphadenopathy, JVD Lungs: diffuse poor air movement, negative wheezes, negative crackles  Cardiovascular: Regular rate and rhythm without murmur gallop or rub normal S1 and S2 Abdomen: Morbidly obese, negative abdominal pain, nondistended, positive soft, bowel sounds, no rebound, no ascites, no appreciable  mass Extremities: No significant cyanosis, clubbing, or edema bilateral lower extremities Skin: Negative rashes, lesions, ulcers Psychiatric:  Negative depression, negative anxiety, negative fatigue, negative mania  Central nervous system:  Cranial nerves II through XII intact, tongue/uvula midline, all extremities muscle strength 5/5, sensation intact throughout, negative dysarthria, negative expressive aphasia, negative receptive aphasia.  .     Data Reviewed: Care during the described time interval was provided by me .  I have reviewed this patient's available data, including medical history, events of note, physical examination, and all test results as part of my evaluation. I have personally reviewed and interpreted all radiology studies.  CBC:  Recent Labs Lab 01/14/17 0039 01/15/17 0229 01/16/17 0259 01/17/17 0347 01/18/17 0452 01/19/17 0325  WBC 26.7* 27.8* 24.3* 22.4* 19.3* 20.6*  NEUTROABS 23.8*  --   --   --   --  17.1*  HGB 13.5 14.9 14.4 14.0 14.4 14.9  HCT 41.7 45.3 46.8 43.5 42.3 44.8  MCV 101.2* 101.6* 104.0* 101.2* 98.6 100.0  PLT 241 255 245 220 220 400   Basic Metabolic Panel:  Recent Labs Lab 01/15/17 0229 01/16/17 0259 01/17/17 0347 01/18/17 0452 01/19/17 0325  NA 137 134* 132* 136 138  K 4.4 5.0 5.3* 4.9 4.7  CL 103 99* 101 104 105  CO2 23 23 19* 21* 25  GLUCOSE 122* 99 109* 127* 133*  BUN 26* 48* 75* 63* 43*  CREATININE 1.18 2.26* 2.44* 1.53* 1.25*  CALCIUM 9.5 9.3 8.7* 9.0 9.2   GFR: Estimated Creatinine Clearance: 91.8 mL/min (A) (by C-G formula based on SCr of 1.25 mg/dL (H)). Liver Function Tests:  Recent Labs Lab 01/15/17 0229 01/16/17 0259 01/17/17 0347 01/18/17 0452 01/19/17 0325  AST 63* 76* 69* 66* 79*  ALT 104* 105* 100* 95* 113*  ALKPHOS 53 55 53 62 50  BILITOT 0.5 0.6 0.7 0.8 0.8  PROT 7.0 6.8 6.3* 6.3* 6.0*  ALBUMIN 3.9 3.9 3.7 3.5 3.3*   No results for input(s): LIPASE, AMYLASE in the last 168 hours.  Recent  Labs Lab 01/14/17 0039 01/17/17 1153  AMMONIA 56* 29   Coagulation Profile:  Recent Labs Lab 01/14/17 0039  INR 1.04   Cardiac Enzymes: No results for input(s): CKTOTAL, CKMB, CKMBINDEX, TROPONINI in the last 168 hours. BNP (last 3 results) No results for input(s): PROBNP in the last 8760 hours. HbA1C: No results for input(s): HGBA1C in the last 72 hours. CBG:  Recent Labs Lab 01/18/17 1141 01/18/17 1258 01/18/17 1555 01/18/17 2059 01/19/17 0737  GLUCAP 123* 177* 157* 148* 143*   Lipid Profile:  Recent Labs  01/18/17 8676  CHOL 189  HDL 40*  LDLCALC 111*  TRIG 188*  CHOLHDL 4.7   Thyroid Function Tests:  Recent Labs  01/17/17 1153  TSH 0.380   Anemia Panel: No results for input(s): VITAMINB12, FOLATE, FERRITIN, TIBC, IRON, RETICCTPCT in the last 72 hours. Sepsis Labs: No results for input(s): PROCALCITON, LATICACIDVEN in the last 168 hours.  Recent Results (from the past 240 hour(s))  Culture, blood (routine x 2)     Status: None (Preliminary result)   Collection Time: 01/17/17 11:29 AM  Result Value Ref Range Status   Specimen Description BLOOD LEFT ANTECUBITAL  Final   Special Requests   Final    BOTTLES DRAWN AEROBIC AND ANAEROBIC Blood Culture results may not be optimal due to an excessive volume of blood received in culture bottles   Culture NO GROWTH 1 DAY  Final   Report Status PENDING  Incomplete  Culture, blood (routine x 2)     Status: None (Preliminary result)   Collection Time: 01/17/17 11:53 AM  Result Value Ref Range Status   Specimen Description BLOOD LEFT ANTECUBITAL  Final   Special Requests IN PEDIATRIC BOTTLE Blood Culture adequate volume  Final   Culture NO GROWTH 1 DAY  Final   Report Status PENDING  Incomplete         Radiology Studies: US Renal  Result Date: 01/17/2017 CLINICAL DATA:  Elevated creatinine, history hypertension, asthma, COPD, coronary artery disease, alcoholic cirrhosis EXAM: RENAL / URINARY TRACT  ULTRASOUND COMPLETE COMPARISON:  CT abdomen and pelvis 01/06/2016, abdomen ultrasound 01/13/2017 FINDINGS: Right Kidney: Length: 10.9 cm. Normal cortical thickness and echogenicity. No mass, hydronephrosis or shadowing calcification. Left Kidney: Length: 11.6 cm. Normal cortical thickness and echogenicity. No mass, hydronephrosis or shadowing calcification. Bladder: Partially distended, unremarkable. IMPRESSION: No sonographic abnormalities of the urinary tract identified. Electronically Signed   By: Lavonia Dana M.D.   On: 01/17/2017 16:51        Scheduled Meds: . aspirin EC  325 mg Oral Daily  . atenolol  50 mg Oral Daily  . azithromycin  500 mg Oral Q24H  . enoxaparin (LOVENOX) injection  40 mg Subcutaneous QHS  . folic acid  1 mg Oral Daily  . insulin aspart  0-15 Units Subcutaneous TID WC  . ipratropium-albuterol  3 mL Nebulization TID  . Melatonin  4.5 mg Oral QHS  . mometasone-formoterol  2 puff Inhalation BID  . morphine  30 mg Oral Q12H  . multivitamin with minerals  1 tablet Oral Daily  . OLANZapine  2.5 mg Oral Daily  . pantoprazole  40 mg Oral BID AC  . [START ON 01/21/2017] predniSONE  40 mg Oral Q breakfast  . [START ON 01/20/2017] predniSONE  50 mg Oral Q breakfast  . sodium chloride flush  3 mL Intravenous Q12H  . thiamine  100 mg Oral Daily  . vitamin B-12  1,000 mcg Oral Daily   Continuous Infusions: . sodium chloride    . cefTRIAXone (ROCEPHIN)  IV Stopped (01/18/17 1749)     LOS: 6 days    Time spent:40 min    WOODS, Geraldo Docker, MD [facility] Pager [phone]  If 7PM-7AM, please contact night-coverage [email or web address] Password [facility] 01/19/2017, 8:18 AM

## 2017-01-20 LAB — CBC
HCT: 45.3 % (ref 39.0–52.0)
HEMOGLOBIN: 14.7 g/dL (ref 13.0–17.0)
MCH: 32.5 pg (ref 26.0–34.0)
MCHC: 32.5 g/dL (ref 30.0–36.0)
MCV: 100.2 fL — ABNORMAL HIGH (ref 78.0–100.0)
Platelets: 238 10*3/uL (ref 150–400)
RBC: 4.52 MIL/uL (ref 4.22–5.81)
RDW: 14.4 % (ref 11.5–15.5)
WBC: 22.6 10*3/uL — ABNORMAL HIGH (ref 4.0–10.5)

## 2017-01-20 LAB — BASIC METABOLIC PANEL
Anion gap: 7 (ref 5–15)
BUN: 32 mg/dL — AB (ref 6–20)
CO2: 34 mmol/L — ABNORMAL HIGH (ref 22–32)
CREATININE: 1.16 mg/dL (ref 0.61–1.24)
Calcium: 9.3 mg/dL (ref 8.9–10.3)
Chloride: 101 mmol/L (ref 101–111)
GFR calc Af Amer: >60 mL/min (ref >60)
Glucose, Bld: 93 mg/dL (ref 65–99)
POTASSIUM: 4.4 mmol/L (ref 3.5–5.1)
SODIUM: 142 mmol/L (ref 135–145)

## 2017-01-20 LAB — GLUCOSE, CAPILLARY
GLUCOSE-CAPILLARY: 88 mg/dL (ref 65–99)
GLUCOSE-CAPILLARY: 112 mg/dL — AB (ref 65–99)
GLUCOSE-CAPILLARY: 168 mg/dL — AB (ref 65–99)
Glucose-Capillary: 137 mg/dL — ABNORMAL HIGH (ref 65–99)

## 2017-01-20 LAB — MAGNESIUM: MAGNESIUM: 2.2 mg/dL (ref 1.7–2.4)

## 2017-01-20 LAB — AMMONIA: AMMONIA: 12 umol/L (ref 9–35)

## 2017-01-20 NOTE — Progress Notes (Signed)
Paged MD regarding no Air cabin crew. Requested tele sitter or discontinue order. Awaiting call back  Salah Nakamura

## 2017-01-20 NOTE — Progress Notes (Signed)
Pt educated on fall risk and importance of calling RN prior to ambulation. Set pt bed alarm. Pt on high low bed. Pt ambulated to bathroom without calling RN. Pt turned off bed alarm himself. Re educated pt safety. Pt dozing on and off in conversation, re set pt bed alarm. Ordered telesitter  Gayleen Sholtz Leory Plowman

## 2017-01-20 NOTE — Progress Notes (Signed)
Radiology called stated to place NPO order for pt MRI tomorrow morning Called social work to inform pt is up and wanting to discuss discharge   Mathew Postiglione Leory Plowman

## 2017-01-20 NOTE — Progress Notes (Signed)
PROGRESS NOTE                                                                                                                                                                                                             Patient Demographics:    Jeremy Mcgee, is a 51 y.o. male, DOB - Oct 26, 1965, NWG:[id number]  Admit date - 01/13/2017   Admitting Physician Vianne Bulls, MD  Outpatient Primary MD for the patient is Patient, No Pcp Per  LOS - 7  Outpatient Specialists:  Dr Hulen Skains ( orthopedics surgeon) in [facility] in [place]  No chief complaint on file.      Brief Narrative   51 year old WM PMHx Morbidly Obese COPD with Chronic Hypoxic respiratory failure(2-3 L oxygen at home.), EtOH liver cirrhosis without ascites (no active alcohol use), CAD, HTN, Chronic Low back pain with radiculopathy, Bilateral knee pain   Was sent from [facility] where he was admitted on 6/17 with acute exacerbation of COPD and worsening chronic back pain with recurrent falls over the past few months due to bilateral lower extremity weakness.  Patient reports chronic low back pain for many years and was following with Dr. Pascal Lux who is an orthopedic surgeon in [place]. He had MRI of the lumbar spine done in early 2017 showing lateral recess narrowing of left L2-L3 and left foraminal stenosis of L4-L5. He has been treated with pain medications and multiple steroid injections. He also follows with pain management and is being treated with long-acting morphine and oxycodone IR. He informs his lower extremity weakness to be bilateral and present for past 3-4 months. He has experienced buckling of his knees causing multiple falls. No head injury or loss of consciousness. He informs the pain radiates down to his legs but denies any bowel or urinary incontinence/retention. Patient was last seen by his orthopedics in March when he received  steroid injection to the knees. He was planned to have MRI of his back and knees but due to severe claustrophobia this was not possible.  Patient was then admitted to [facility] for acute exacerbation of COPD and ongoing severe low back pain with bilateral leg weakness. Blood work done showed leukocytosis. B12 was normal. Patient was being treated with Solu-Medrol and nebs along with empiric antibiotics. Patient was unable to tolerate MRI of his low back due  to severe claustrophobia. Since he would need an open MRI or done under sedation followed by possible neurosurgical consultation he was transferred to [facility].   Subjective:   Pain medications were adjusted since patient appeared encephalopathic during the weekend with slurred speech. This morning he informs his breathing to be much better and low back pain have improved. Still has weakness in his legs.   Assessment  & Plan :    Principal Problem:   COPD With acute exacerbation ([place]) Has chronic hypoxic respiratory failure with centrilobular emphysema. Currently maintaining sats on 2 L via nasal cannula. Transition to oral prednisone. Completed 5 day course of antibiotic. Continue when necessary nebs and antitussives. Symptoms much improved since admission and I suspect his back to his baseline.  Active Problems: Chronic low back pain with bilateral lower extremity weakness. 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 MRI done outside in January 2017 shows lumbar stenosis. Patient having multiple falls at home and found to be very weak on PT evaluation. No signs of cord compression clinically. MRI of the lumbar spine ordered under conscious sedation but given his tenuous respiratory function last week anesthesia recommended that he needed to be intubated. Patient was monitored for improvement in his respiratory function and now that he is better he will be scheduled for MRI tomorrow morning. On discussing with his orthopedic surgeon last  week patient had previous MRI done 10 mg of Valium and hopefully he should be ordered ordered MRI on this dose.  Patient currently on MS Contin twice a day and oxycodone IR every 6 hours. (This is his home regimen). Dilaudid discontinued due to metabolic encephalopathy. Patient follows chronically in pain clinic.   Acute kidney injury Suspect prerenal with dehydration versus ATN due to NSAID use. Discontinued ACE inhibitor and renal function improved with gentle hydration. Counseled strongly on smoking cessation.  Acute metabolic encephalopathy Suspected due to narcotic and benzodiazepine use. Goes adjusted and now resolved.    CAD (coronary artery disease)   Tobacco abuse    Alcoholic cirrhosis of liver without ascites ([place]) Counseled strongly on alcohol cessation. Has chronic transaminitis.    hyperglycemia with prediabetes A1c of 5.7. Monitor on sliding scale coverage.  Essential hypertension Continue beta blocker. Lisinopril discontinued.  Morbid obesity with ?OHS Needs outpatient sleep study.   Code Status : Full code  Family Communication  : None at bedside  Disposition Plan  : SNF once MRI of the lumbar spine complete  Barriers For Discharge : Active back pain, pending MRI of the back  Consults  :  None  Procedures  : None  DVT Prophylaxis  :  Lovenox -  Lab Results  Component Value Date   PLT 238 01/20/2017    Antibiotics  :    Anti-infectives    Start     Dose/Rate Route Frequency Ordered Stop   01/18/17 2000  azithromycin (ZITHROMAX) tablet 500 mg     500 mg Oral Every 24 hours 01/18/17 1327 01/19/17 1958   01/14/17 2000  azithromycin (ZITHROMAX) 500 mg in dextrose 5 % 250 mL IVPB  Status:  Discontinued     500 mg 250 mL/hr over 60 Minutes Intravenous Every 24 hours 01/13/17 2340 01/18/17 1327   01/14/17 1800  cefTRIAXone (ROCEPHIN) 1 g in dextrose 5 % 50 mL IVPB    Comments:  Tolerating Rocephin at [facility] prior to transfer   1 g 100 mL/hr over 30  Minutes Intravenous Every 24 hours 01/13/17 2340 01/19/17 2359  Objective:   Vitals:   01/20/17 0758 01/20/17 1216 01/20/17 1246 01/20/17 1411  BP:  (!) 160/106 (!) 130/95   Pulse:  64 73   Resp:  18 19   Temp:  97.7 F (36.5 C)    TempSrc:  Oral    SpO2: 97% 97%  97%  Weight:      Height:        Wt Readings from Last 3 Encounters:  01/20/17 122.4 kg (269 lb 12.8 oz)  01/04/15 122.5 kg (270 lb)  06/19/14 127 kg (280 lb)     Intake/Output Summary (Last 24 hours) at 01/20/17 1437 Last data filed at 01/20/17 0900  Gross per 24 hour  Intake              600 ml  Output             1250 ml  Net             -650 ml     Physical Exam  Gen: not in distress HEENT:, moist mucosa, supple neck Chest: clear b/l, no added sounds CVS: N S1&S2, no murmurs,  GI: soft, NT, ND,  Musculoskeletal: warm, no edema, Mild tenderness in low back      Data Review:    CBC  Recent Labs Lab 01/14/17 0039  01/16/17 0259 01/17/17 0347 01/18/17 0452 01/19/17 0325 01/20/17 0413  WBC 26.7*  < > 24.3* 22.4* 19.3* 20.6* 22.6*  HGB 13.5  < > 14.4 14.0 14.4 14.9 14.7  HCT 41.7  < > 46.8 43.5 42.3 44.8 45.3  PLT 241  < > 245 220 220 216 238  MCV 101.2*  < > 104.0* 101.2* 98.6 100.0 100.2*  MCH 32.8  < > 32.0 32.6 33.6 33.3 32.5  MCHC 32.4  < > 30.8 32.2 34.0 33.3 32.5  RDW 14.9  < > 15.3 14.7 14.6 14.7 14.4  LYMPHSABS 1.6  --   --   --   --  2.1  --   MONOABS 1.3*  --   --   --   --  1.4*  --   EOSABS 0.0  --   --   --   --  0.0  --   BASOSABS 0.0  --   --   --   --  0.0  --   < > = values in this interval not displayed.  Chemistries   Recent Labs Lab 01/15/17 0229 01/16/17 0259 01/17/17 0347 01/18/17 0452 01/19/17 0325 01/20/17 0413  NA 137 134* 132* 136 138 142  K 4.4 5.0 5.3* 4.9 4.7 4.4  CL 103 99* 101 104 105 101  CO2 23 23 19* 21* 25 34*  GLUCOSE 122* 99 109* 127* 133* 93  BUN 26* 48* 75* 63* 43* 32*  CREATININE 1.18 2.26* 2.44* 1.53* 1.25* 1.16  CALCIUM  9.5 9.3 8.7* 9.0 9.2 9.3  MG  --   --   --   --   --  2.2  AST 63* 76* 69* 66* 79*  --   ALT 104* 105* 100* 95* 113*  --   ALKPHOS 53 55 53 62 50  --   BILITOT 0.5 0.6 0.7 0.8 0.8  --    ------------------------------------------------------------------------------------------------------------------  Recent Labs  01/18/17 0452  CHOL 189  HDL 40*  LDLCALC 111*  TRIG 188*  CHOLHDL 4.7    Lab Results  Component Value Date   HGBA1C 5.7 (H) 01/14/2017   ------------------------------------------------------------------------------------------------------------------ No results for input(s):  TSH, T4TOTAL, T3FREE, THYROIDAB in the last 72 hours.  Invalid input(s): FREET3 ------------------------------------------------------------------------------------------------------------------ No results for input(s): VITAMINB12, FOLATE, FERRITIN, TIBC, IRON, RETICCTPCT in the last 72 hours.  Coagulation profile  Recent Labs Lab 01/14/17 0039  INR 1.04    No results for input(s): DDIMER in the last 72 hours.  Cardiac Enzymes No results for input(s): CKMB, TROPONINI, MYOGLOBIN in the last 168 hours.  Invalid input(s): CK ------------------------------------------------------------------------------------------------------------------ No results found for: BNP  Inpatient Medications  Scheduled Meds: . aspirin EC  325 mg Oral Daily  . atenolol  50 mg Oral Daily  . enoxaparin (LOVENOX) injection  40 mg Subcutaneous QHS  . folic acid  1 mg Oral Daily  . insulin aspart  0-15 Units Subcutaneous TID WC  . ipratropium-albuterol  3 mL Nebulization TID  . Melatonin  4.5 mg Oral QHS  . mometasone-formoterol  2 puff Inhalation BID  . morphine  15 mg Oral q morning - 10a  . morphine  30 mg Oral Q2200  . multivitamin with minerals  1 tablet Oral Daily  . OLANZapine  2.5 mg Oral Daily  . pantoprazole  40 mg Oral BID AC  . predniSONE  20 mg Oral BID WC  . sodium chloride flush  3 mL  Intravenous Q12H  . thiamine  100 mg Oral Daily  . vitamin B-12  1,000 mcg Oral Daily   Continuous Infusions: . sodium chloride     PRN Meds:.sodium chloride, acetaminophen **OR** acetaminophen, alum & mag hydroxide-simeth, bisacodyl, chlorpheniramine-HYDROcodone, diphenhydrAMINE, guaiFENesin-dextromethorphan, ipratropium-albuterol, ondansetron **OR** ondansetron (ZOFRAN) IV, oxyCODONE, polyethylene glycol, sodium chloride flush  Micro Results Recent Results (from the past 240 hour(s))  Culture, blood (routine x 2)     Status: None (Preliminary result)   Collection Time: 01/17/17 11:29 AM  Result Value Ref Range Status   Specimen Description BLOOD LEFT ANTECUBITAL  Final   Special Requests   Final    BOTTLES DRAWN AEROBIC AND ANAEROBIC Blood Culture results may not be optimal due to an excessive volume of blood received in culture bottles   Culture NO GROWTH 3 DAYS  Final   Report Status PENDING  Incomplete  Culture, blood (routine x 2)     Status: None (Preliminary result)   Collection Time: 01/17/17 11:53 AM  Result Value Ref Range Status   Specimen Description BLOOD LEFT ANTECUBITAL  Final   Special Requests IN PEDIATRIC BOTTLE Blood Culture adequate volume  Final   Culture NO GROWTH 3 DAYS  Final   Report Status PENDING  Incomplete    Radiology Reports US Renal  Result Date: 01/17/2017 CLINICAL DATA:  Elevated creatinine, history hypertension, asthma, COPD, coronary artery disease, alcoholic cirrhosis EXAM: RENAL / URINARY TRACT ULTRASOUND COMPLETE COMPARISON:  CT abdomen and pelvis 01/06/2016, abdomen ultrasound 01/13/2017 FINDINGS: Right Kidney: Length: 10.9 cm. Normal cortical thickness and echogenicity. No mass, hydronephrosis or shadowing calcification. Left Kidney: Length: 11.6 cm. Normal cortical thickness and echogenicity. No mass, hydronephrosis or shadowing calcification. Bladder: Partially distended, unremarkable. IMPRESSION: No sonographic abnormalities of the urinary  tract identified. Electronically Signed   By: Lavonia Dana M.D.   On: 01/17/2017 16:51   Dg Chest Port 1 View  Result Date: 01/14/2017 CLINICAL DATA:  Acute onset of shortness of breath and wheezing. Initial encounter. EXAM: PORTABLE CHEST 1 VIEW COMPARISON:  Chest radiograph performed 01/12/2017 FINDINGS: The lungs are well-aerated. Mild bibasilar airspace opacities raise concern for pneumonia. There is no evidence of pleural effusion or pneumothorax. The cardiomediastinal silhouette is within normal  limits. No acute osseous abnormalities are seen. IMPRESSION: Mild bibasilar airspace opacities raise concern for pneumonia. Electronically Signed   By: Garald Balding M.D.   On: 01/14/2017 00:56    Time Spent in minutes  25   Louellen Molder M.D on 01/20/2017 at 2:37 PM  Between 7am to 7pm - Pager - [phone]  After 7pm go to [email or web address] - password [facility]  [facility] -  Office  [phone]

## 2017-01-20 NOTE — Progress Notes (Signed)
MD stated to discontinue tele sitter in preparation for discharge to SNF tomorrow Social worker at bedside tried to discuss discharge with pt, pt asleep, will call social worker when pt is awake  Jeremy Mcgee

## 2017-01-20 NOTE — Clinical Social Work Note (Signed)
Patient gave CSW the phone number for a contact at [facility]: [phone]). CSW left her a voicemail to determine if they had his birth certificate on file for his [employer]. CSW also called the main phone number but was unable to get through on the line.  Dayton Scrape, [place]

## 2017-01-20 NOTE — Progress Notes (Signed)
Occupational Therapy Treatment Patient Details Name: Jeremy Mcgee MRN: [id number] DOB: [birth year] Today's Date: 01/20/2017    History of present illness Pt is a 51 yo male with c/o of SoB over increasing over the past week. Pt also experiencing 7/10 LBP that radiates into his LE L>R. PMH significant for COPD with chronic hypoxic respiratory failure, alcoholic cirrhosis without ascites, remote alcohol abuse, coronary artery disease, hypertension   OT comments  Pt progressing towards established goals. Provided education on LB dressing with AE. Pt demonstrated understanding. Provided handout on AE and EC to increase pt safety and independence; pt very appreciative and verbalized understanding. Continue to feel pt would benefit from dc to SNF for further OT. Will continue to follow acutely to facilitate safe dc.   Follow Up Recommendations  SNF;Supervision/Assistance - 24 hour    Equipment Recommendations  None recommended by OT    Recommendations for Other Services      Precautions / Restrictions Precautions Precautions: Fall Precaution Comments: Pt has fallen 6 times in the last 6 months, and after the last fall has developed a constant UE tremor R>L.  Restrictions Weight Bearing Restrictions: No       Mobility Bed Mobility Overal bed mobility: Modified Independent             General bed mobility comments: increased time/effort and use of bed rail  Transfers Overall transfer level: Needs assistance Equipment used: Straight cane Transfers: Sit to/from Stand Sit to Stand: Min assist         General transfer comment: assist to power up into standing and cues for safety    Balance Overall balance assessment: Needs assistance Sitting-balance support: No upper extremity supported Sitting balance-Leahy Scale: Fair Sitting balance - Comments: Performed LB dressing at EOB   Standing balance support: Single extremity supported Standing balance-Leahy Scale:  Poor Standing balance comment: pt is able to static stand with single UE support otherwise bilat UE required                           ADL either performed or assessed with clinical judgement   ADL Overall ADL's : Needs assistance/impaired               Lower Body Bathing Details (indicate cue type and reason): Educated on AE for LB bathing     Lower Body Dressing: Sit to/from stand;Min guard;With adaptive equipment Lower Body Dressing Details (indicate cue type and reason): Educated pt on AE. Pt demonstrated understanding by donning socks and shoes. Pt verbalized understanding of donning pants with reacher.                General ADL Comments: Pt appreciative of education on AE. provided hand out of AE and EC.      Vision   Vision Assessment?: No apparent visual deficits   Perception     Praxis      Cognition Arousal/Alertness: Awake/alert Behavior During Therapy: WFL for tasks assessed/performed Overall Cognitive Status: Within Functional Limits for tasks assessed Area of Impairment: Safety/judgement                   Current Attention Level: Selective     Safety/Judgement: Decreased awareness of safety Awareness: Intellectual   General Comments: pt pleasant and eager to participate in therapy session; pt with decreased safety awareness although he is aware of his deficits . Pt became tearful at end of session stating he felt overwhelmed by his current  situation.        Exercises     Shoulder Instructions       General Comments Pt required reminders to leave O2 on via n/c. Pt tearful at end of session stating he felt overwhem with current situation    Pertinent Vitals/ Pain       Pain Assessment: Faces Faces Pain Scale: Hurts little more Pain Location: back and knees (R>L) Pain Descriptors / Indicators: Aching;Guarding Pain Intervention(s): Monitored during session;Repositioned  Home Living                                           Prior Functioning/Environment              Frequency  Min 2X/week        Progress Toward Goals  OT Goals(current goals can now be found in the care plan section)  Progress towards OT goals: Progressing toward goals  Acute Rehab OT Goals Patient Stated Goal: Get better OT Goal Formulation: With patient Time For Goal Achievement: 01/28/17 Potential to Achieve Goals: Fair ADL Goals Pt Will Perform Grooming: with modified independence;standing (2 tasks) Pt Will Perform Lower Body Bathing: with supervision;sit to/from stand;with adaptive equipment Pt Will Perform Lower Body Dressing: with supervision;sit to/from stand;with adaptive equipment Pt Will Transfer to Toilet: ambulating;bedside commode;with modified independence Pt Will Perform Toileting - Clothing Manipulation and hygiene: with modified independence;sit to/from stand Pt Will Perform Tub/Shower Transfer: with supervision;ambulating;shower seat  Plan Discharge plan remains appropriate    Co-evaluation                 AM-PAC PT "6 Clicks" Daily Activity     Outcome Measure   Help from another person eating meals?: None Help from another person taking care of personal grooming?: A Little Help from another person toileting, which includes using toliet, bedpan, or urinal?: A Little Help from another person bathing (including washing, rinsing, drying)?: A Little Help from another person to put on and taking off regular upper body clothing?: None Help from another person to put on and taking off regular lower body clothing?: A Little 6 Click Score: 20    End of Session Equipment Utilized During Treatment: Oxygen  OT Visit Diagnosis: Unsteadiness on feet (R26.81);Muscle weakness (generalized) (M62.81);Repeated falls (R29.6);Pain;History of falling (Z91.81) Pain - part of body:  (back)   Activity Tolerance Patient limited by pain   Patient Left in bed;with call bell/phone within  reach;with bed alarm set   Nurse Communication Mobility status        Time: 0932-6712 OT Time Calculation (min): 22 min  Charges: OT General Charges $OT Visit: 1 Procedure OT Treatments $Self Care/Home Management : 8-22 mins  [place], OTR/L Acute Rehab Pager: [phone] Office: [place] 01/20/2017, 5:18 PM

## 2017-01-20 NOTE — Progress Notes (Signed)
Physical Therapy Treatment Patient Details Name: Antoinette Haskett MRN: [id number] DOB: 1965/11/07 Today's Date: 01/20/2017    History of Present Illness Pt is a 51 yo male with c/o of SoB over increasing over the past week. Pt also experiencing 7/10 LBP that radiates into his LE L>R. PMH significant for COPD with chronic hypoxic respiratory failure, alcoholic cirrhosis without ascites, remote alcohol abuse, coronary artery disease, hypertension    PT Comments    Patient was pleasant and eager to mobilize this session. Pt tolerated increased gait distance with SPC and HHA. RW next session. Pt demonstrated decreased safety awareness and required min/mod A for all OOB mobility. VSS. Continue to progress as tolerated with anticipated d/c to SNF for further skilled PT services.    Follow Up Recommendations  SNF     Equipment Recommendations  None recommended by PT    Recommendations for Other Services OT consult     Precautions / Restrictions Precautions Precautions: Fall Precaution Comments: Pt has fallen 6 times in the last 6 months, and after the last fall has developed a constant UE tremor R>L.     Mobility  Bed Mobility Overal bed mobility: Modified Independent             General bed mobility comments: increased time/effort and use of bed rail  Transfers Overall transfer level: Needs assistance Equipment used: Straight cane Transfers: Sit to/from Stand Sit to Stand: Min assist         General transfer comment: assist to power up into standing and cues for safety  Ambulation/Gait Ambulation/Gait assistance: Mod assist Ambulation Distance (Feet): 100 Feet Assistive device: Straight cane;1 person hand held assist Gait Pattern/deviations: Step-through pattern;Decreased stride length;Staggering left;Staggering right Gait velocity: slowed   General Gait Details: assist for balance; R knee buckling at times but no LOB; two brief standing rest breaks due to 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    Modified Rankin (Stroke Patients Only)       Balance Overall balance assessment: Needs assistance Sitting-balance support: No upper extremity supported Sitting balance-Leahy Scale: Fair     Standing balance support: Single extremity supported Standing balance-Leahy Scale: Poor Standing balance comment: pt is able to static stand with single UE support otherwise bilat UE required                            Cognition Arousal/Alertness: Awake/alert Behavior During Therapy: WFL for tasks assessed/performed Overall Cognitive Status: Within Functional Limits for tasks assessed Area of Impairment: Safety/judgement                         Safety/Judgement: Decreased awareness of safety Awareness: Intellectual   General Comments: pt pleasant and eager to mobilize this session; pt with decreased safety awareness although he is aware of his deficits       Exercises      General Comments General comments (skin integrity, edema, etc.): SpO2 93% or > on RA with mobility      Pertinent Vitals/Pain Pain Assessment: Faces Faces Pain Scale: Hurts little more Pain Location: back and knees (R>L) Pain Descriptors / Indicators: Aching;Guarding Pain Intervention(s): Monitored during session;Repositioned    Home Living                      Prior Function  PT Goals (current goals can now be found in the care plan section) Progress towards PT goals: Progressing toward goals    Frequency    Min 2X/week      PT Plan Current plan remains appropriate    Co-evaluation              AM-PAC PT "6 Clicks" Daily Activity  Outcome Measure  Difficulty turning over in bed (including adjusting bedclothes, sheets and blankets)?: A Little Difficulty moving from lying on back to sitting on the side of the bed? : A Lot Difficulty sitting down on and standing up from a chair with arms  (e.g., wheelchair, bedside commode, etc,.)?: Total Help needed moving to and from a bed to chair (including a wheelchair)?: A Little Help needed walking in hospital room?: A Little Help needed climbing 3-5 steps with a railing? : Total 6 Click Score: 13    End of Session Equipment Utilized During Treatment: Gait belt Activity Tolerance: Patient tolerated treatment well Patient left: in bed;with call bell/phone within reach;Other (comment) (sitting EOB with OT present) Nurse Communication: Mobility status PT Visit Diagnosis: Unsteadiness on feet (R26.81);Repeated falls (R29.6);Other abnormalities of gait and mobility (R26.89);Muscle weakness (generalized) (M62.81);History of falling (Z91.81);Difficulty in walking, not elsewhere classified (R26.2);Other symptoms and signs involving the nervous system (R29.898);Pain Pain - part of body: Leg     Time: 8887-5797 PT Time Calculation (min) (ACUTE ONLY): 17 min  Charges:  $Gait Training: 8-22 mins                    G Codes:       Earney Navy, PTA Pager: [phone]     Darliss Cheney 01/20/2017, 4:16 PM

## 2017-01-21 ENCOUNTER — Encounter (HOSPITAL_COMMUNITY): Payer: Self-pay | Admitting: Certified Registered"

## 2017-01-21 ENCOUNTER — Inpatient Hospital Stay (HOSPITAL_COMMUNITY): Payer: Medicare Other

## 2017-01-21 ENCOUNTER — Inpatient Hospital Stay (HOSPITAL_COMMUNITY): Payer: Medicare Other | Admitting: Certified Registered"

## 2017-01-21 ENCOUNTER — Encounter (HOSPITAL_COMMUNITY)
Admission: EM | Discharge status: Against Medical Advice | Payer: Self-pay | Source: Other Acute Inpatient Hospital | Attending: Internal Medicine

## 2017-01-21 DIAGNOSIS — J181 Lobar pneumonia, unspecified organism: Secondary | ICD-10-CM

## 2017-01-21 HISTORY — PX: RADIOLOGY WITH ANESTHESIA: SHX6223

## 2017-01-21 LAB — GLUCOSE, CAPILLARY
Glucose-Capillary: 87 mg/dL (ref 65–99)
Glucose-Capillary: 94 mg/dL (ref 65–99)

## 2017-01-21 SURGERY — RADIOLOGY WITH ANESTHESIA
Anesthesia: General

## 2017-01-21 MED ORDER — OXYCODONE HCL 5 MG PO TABS: 5.0000 mg | ORAL_TABLET | Freq: Once | ORAL | Status: DC | PRN

## 2017-01-21 MED ORDER — ONDANSETRON HCL 4 MG/2ML IJ SOLN
INTRAMUSCULAR | Status: AC
  Filled 2017-01-21: qty 2

## 2017-01-21 MED ORDER — FENTANYL CITRATE (PF) 100 MCG/2ML IJ SOLN: 25.0000 ug | INTRAMUSCULAR | Status: DC | PRN

## 2017-01-21 MED ORDER — LACTATED RINGERS IV SOLN
INTRAVENOUS | Status: DC
  Administered 2017-01-21: 09:00:00 via INTRAVENOUS

## 2017-01-21 MED ORDER — ONDANSETRON HCL 4 MG/2ML IJ SOLN
4.0000 mg | Freq: Once | INTRAMUSCULAR | Status: AC | PRN
  Administered 2017-01-21: 4 mg via INTRAVENOUS

## 2017-01-21 MED ORDER — GADOBENATE DIMEGLUMINE 529 MG/ML IV SOLN
20.0000 mL | Freq: Once | INTRAVENOUS | Status: AC
  Administered 2017-01-21: 20 mL via INTRAVENOUS

## 2017-01-21 MED ORDER — OXYCODONE HCL 5 MG/5ML PO SOLN: 5.0000 mg | Freq: Once | ORAL | Status: DC | PRN

## 2017-01-21 NOTE — Transfer of Care (Signed)
Immediate Anesthesia Transfer of Care Note  [name]  Procedure(s) Performed: Procedure(s): LUMBER SPINE WITH AND WITHOUT CONTRAST (N/A)  Patient Location: PACU  Anesthesia Type:General  Level of Consciousness: awake, alert  and oriented  Airway & Oxygen Therapy: Patient Spontanous Breathing and Patient connected to face mask oxygen  Post-op Assessment: Report given to RN, Post -op Vital signs reviewed and stable and Patient moving all extremities X 4  Post vital signs: Reviewed and stable  Last Vitals:  Vitals:   01/20/17 2227 01/21/17 0541  BP: 130/86 126/66  Pulse: 62 69  Resp: 18   Temp: 37.1 C 36.9 C    Last Pain:  Vitals:   01/21/17 0745  TempSrc:   PainSc: 0-No pain      Patients Stated Pain Goal: 2 (07/61/51 8343)  Complications: No apparent anesthesia complications

## 2017-01-21 NOTE — Progress Notes (Signed)
Paged MD regarding diet order for pt Pt returned from [place]

## 2017-01-21 NOTE — Anesthesia Preprocedure Evaluation (Signed)
Anesthesia Evaluation  Patient identified by MRN, date of birth, ID band Patient awake    Reviewed: Allergy & Precautions, NPO status , Patient's Chart, lab work & pertinent test results  Airway Mallampati: III  TM Distance: >3 FB Neck ROM: Full    Dental  (+) Edentulous Upper, Dental Advisory Given   Pulmonary Current Smoker,    breath sounds clear to auscultation       Cardiovascular hypertension,  Rhythm:Regular Rate:Normal     Neuro/Psych    GI/Hepatic   Endo/Other    Renal/GU      Musculoskeletal   Abdominal (+) + obese,   Peds  Hematology   Anesthesia Other Findings   Reproductive/Obstetrics                             Anesthesia Physical Anesthesia Plan  ASA: III  Anesthesia Plan: General   Post-op Pain Management:    Induction: Intravenous  PONV Risk Score and Plan: Ondansetron, Propofol and Midazolam  Airway Management Planned: Oral ETT  Additional Equipment:   Intra-op Plan:   Post-operative Plan: Extubation in OR  Informed Consent: I have reviewed the patients History and Physical, chart, labs and discussed the procedure including the risks, benefits and alternatives for the proposed anesthesia with the patient or authorized representative who has indicated his/her understanding and acceptance.   Dental advisory given  Plan Discussed with: CRNA and Anesthesiologist  Anesthesia Plan Comments:         Anesthesia Quick Evaluation

## 2017-01-21 NOTE — Plan of Care (Signed)
Problem: Fluid Volume: Goal: Ability to maintain a balanced intake and output will improve Outcome: Progressing Pt. States he's always thirsty

## 2017-01-21 NOTE — Progress Notes (Signed)
Pt states he wants to leave AMA, pt is concerned he wont have a place to stay if he leaves after today. Educated pt on SNF and rehab options he previously discussed with Clarise Cruz, Education officer, museum, pt states he does not want to wait. Informed Education officer, museum. Page out to MD, awaiting call back  Charonda Hefter Leory Plowman

## 2017-01-21 NOTE — Progress Notes (Signed)
Paged MRI to see when pt was on the schedule today, she stated this morning, one case  In front and to keep pt NPO MRI nurse states he'll receive anesthesia   Jeremy Mcgee

## 2017-01-21 NOTE — Progress Notes (Signed)
PROGRESS NOTE                                                                                                                                                                                                             Jeremy Mcgee Demographics:    Jeremy Mcgee, is a 51 y.o. male, DOB - April 16, 1966, MLY:[id number]  Admit date - 01/13/2017   Admitting Physician Vianne Bulls, MD  Outpatient Primary MD for the Jeremy Mcgee is Jeremy Mcgee, No Pcp Per  LOS - 8  Outpatient Specialists:  Dr Hulen Skains ( orthopedics surgeon) in [facility] in [place]  No chief complaint on file.      Brief Narrative   51 year old WM PMHx Morbidly Obese COPD with Chronic Hypoxic respiratory failure(2-3 L oxygen at home.), EtOH liver cirrhosis without ascites (no active alcohol use), CAD, HTN, Chronic Low back pain with radiculopathy, Bilateral knee pain   Was sent from [facility] where he was admitted on 6/17 with acute exacerbation of COPD and worsening chronic back pain with recurrent falls over the past few months due to bilateral lower extremity weakness.  Jeremy Mcgee reports chronic low back pain for many years and was following with Dr. Pascal Lux who is an orthopedic surgeon in [place]. He had MRI of the lumbar spine done in early 2017 showing lateral recess narrowing of left L2-L3 and left foraminal stenosis of L4-L5. He has been treated with pain medications and multiple steroid injections. He also follows with pain management and is being treated with long-acting morphine and oxycodone IR. He informs his lower extremity weakness to be bilateral and present for past 3-4 months. He has experienced buckling of his knees causing multiple falls. No head injury or loss of consciousness. He informs the pain radiates down to his legs but denies any bowel or urinary incontinence/retention. Jeremy Mcgee was last seen by his orthopedics in March when he received  steroid injection to the knees. He was planned to have MRI of his back and knees but due to severe claustrophobia this was not possible.  Jeremy Mcgee was then admitted to [facility] for acute exacerbation of COPD and ongoing severe low back pain with bilateral leg weakness. Blood work done showed leukocytosis. B12 was normal. Jeremy Mcgee was being treated with Solu-Medrol and nebs along with empiric antibiotics. Jeremy Mcgee was unable to tolerate MRI of his low back due  to severe claustrophobia. Since he would need an open MRI or done under sedation followed by possible neurosurgical consultation he was transferred to [facility].   Subjective:   Shortness of breath better. Back pain stable on current meds.   Assessment  & Plan :    Principal Problem:   COPD With acute exacerbation ([place]) Has chronic hypoxic respiratory failure with centrilobular emphysema. Currently maintaining sats on 2 L via nasal cannula. Transition to oral prednisone. Completed 5 day course of antibiotic. Continue when necessary nebs and antitussives. Symptoms have improved to baseline.  Active Problems: Chronic low back pain with bilateral lower extremity weakness. Suspect lumbar radiculopathy. MRI done outside in January 2017 shows lateral recess narrowing of L2-3, and left foraminal stenosis of L4-5.Jeremy Mcgee having multiple falls at home and found to be very weak on PT evaluation. No signs of cord compression clinically. MRI of the lumbar spine under conscious sedation today shows degenerative disc bulge with tiny disc extrusion at L2- L5, causing mild lateral recess stenosis.  Findings doesnot seem to have changed from prior MRI result in feb 2017. I will discuss MRI result with neurosurgery.  Jeremy Mcgee currently on MS Contin twice a day and oxycodone IR every 6 hours. (This is his home regimen). Dilaudid discontinued due to metabolic encephalopathy. Jeremy Mcgee follows chronically in pain clinic.   Acute kidney injury Suspect  prerenal with dehydration versus ATN due to NSAID use. Discontinued ACE inhibitor and renal function improved with gentle hydration. Counseled strongly on smoking cessation.  Acute metabolic encephalopathy Suspected due to narcotic and benzodiazepine use.  Dose adjusted and now resolved.    CAD (coronary artery disease)   Tobacco abuse    Alcoholic cirrhosis of liver without ascites ([place]) Counseled strongly on alcohol cessation. Has chronic transaminitis.    hyperglycemia with prediabetes A1c of 5.7. Monitor on sliding scale coverage.  Essential hypertension Continue beta blocker. Lisinopril discontinued.  Morbid obesity with ?OHS Needs outpatient sleep study.   Code Status : Full code  Family Communication  : None at bedside  Disposition Plan  : SNF in am if no intervention per neurosurgery.   Consults  :  None  Procedures  : None  DVT Prophylaxis  :  Lovenox -  Lab Results  Component Value Date   PLT 238 01/20/2017    Antibiotics  :    Anti-infectives    Start     Dose/Rate Route Frequency Ordered Stop   01/18/17 2000  azithromycin (ZITHROMAX) tablet 500 mg     500 mg Oral Every 24 hours 01/18/17 1327 01/19/17 1958   01/14/17 2000  azithromycin (ZITHROMAX) 500 mg in dextrose 5 % 250 mL IVPB  Status:  Discontinued     500 mg 250 mL/hr over 60 Minutes Intravenous Every 24 hours 01/13/17 2340 01/18/17 1327   01/14/17 1800  cefTRIAXone (ROCEPHIN) 1 g in dextrose 5 % 50 mL IVPB    Comments:  Tolerating Rocephin at [facility] prior to transfer   1 g 100 mL/hr over 30 Minutes Intravenous Every 24 hours 01/13/17 2340 01/19/17 2359        Objective:   Vitals:   01/21/17 1326 01/21/17 1345 01/21/17 1400 01/21/17 1422  BP: (!) 156/99 (!) 160/95 (!) 168/100 (!) 170/59  Pulse: (!) 109 100 (!) 107 79  Resp: 16 18 (!) 22 18  Temp: 97.7 F (36.5 C)  97.8 F (36.6 C) 99.5 F (37.5 C)  TempSrc:    Oral  SpO2: 98% 95% 94%  95%  Weight:      Height:        Wt Readings  from Last 3 Encounters:  01/21/17 128 kg (282 lb 3.2 oz)  01/04/15 122.5 kg (270 lb)  06/19/14 127 kg (280 lb)     Intake/Output Summary (Last 24 hours) at 01/21/17 1503 Last data filed at 01/21/17 1502  Gross per 24 hour  Intake           587.67 ml  Output              900 ml  Net          -312.33 ml     Physical Exam Gen: NAD  HEENT: moist mucosa, supple neck Chest : improved breath sounds b/l CVS: NS1&S2  GI: soft, NT, ND Musculoskeletal: warm, no edema CNS; alert and oriented , non focal      Data Review:    CBC  Recent Labs Lab 01/16/17 0259 01/17/17 0347 01/18/17 0452 01/19/17 0325 01/20/17 0413  WBC 24.3* 22.4* 19.3* 20.6* 22.6*  HGB 14.4 14.0 14.4 14.9 14.7  HCT 46.8 43.5 42.3 44.8 45.3  PLT 245 220 220 216 238  MCV 104.0* 101.2* 98.6 100.0 100.2*  MCH 32.0 32.6 33.6 33.3 32.5  MCHC 30.8 32.2 34.0 33.3 32.5  RDW 15.3 14.7 14.6 14.7 14.4  LYMPHSABS  --   --   --  2.1  --   MONOABS  --   --   --  1.4*  --   EOSABS  --   --   --  0.0  --   BASOSABS  --   --   --  0.0  --     Chemistries   Recent Labs Lab 01/15/17 0229 01/16/17 0259 01/17/17 0347 01/18/17 0452 01/19/17 0325 01/20/17 0413  NA 137 134* 132* 136 138 142  K 4.4 5.0 5.3* 4.9 4.7 4.4  CL 103 99* 101 104 105 101  CO2 23 23 19* 21* 25 34*  GLUCOSE 122* 99 109* 127* 133* 93  BUN 26* 48* 75* 63* 43* 32*  CREATININE 1.18 2.26* 2.44* 1.53* 1.25* 1.16  CALCIUM 9.5 9.3 8.7* 9.0 9.2 9.3  MG  --   --   --   --   --  2.2  AST 63* 76* 69* 66* 79*  --   ALT 104* 105* 100* 95* 113*  --   ALKPHOS 53 55 53 62 50  --   BILITOT 0.5 0.6 0.7 0.8 0.8  --    ------------------------------------------------------------------------------------------------------------------ No results for input(s): CHOL, HDL, LDLCALC, TRIG, CHOLHDL, LDLDIRECT in the last 72 hours.  Lab Results  Component Value Date   HGBA1C 5.7 (H) 01/14/2017    ------------------------------------------------------------------------------------------------------------------ No results for input(s): TSH, T4TOTAL, T3FREE, THYROIDAB in the last 72 hours.  Invalid input(s): FREET3 ------------------------------------------------------------------------------------------------------------------ No results for input(s): VITAMINB12, FOLATE, FERRITIN, TIBC, IRON, RETICCTPCT in the last 72 hours.  Coagulation profile No results for input(s): INR, PROTIME in the last 168 hours.  No results for input(s): DDIMER in the last 72 hours.  Cardiac Enzymes No results for input(s): CKMB, TROPONINI, MYOGLOBIN in the last 168 hours.  Invalid input(s): CK ------------------------------------------------------------------------------------------------------------------ No results found for: BNP  Inpatient Medications  Scheduled Meds: . aspirin EC  325 mg Oral Daily  . atenolol  50 mg Oral Daily  . enoxaparin (LOVENOX) injection  40 mg Subcutaneous QHS  . folic acid  1 mg Oral Daily  . insulin aspart  0-15 Units Subcutaneous TID  WC  . ipratropium-albuterol  3 mL Nebulization TID  . Melatonin  4.5 mg Oral QHS  . mometasone-formoterol  2 puff Inhalation BID  . morphine  15 mg Oral q morning - 10a  . morphine  30 mg Oral Q2200  . multivitamin with minerals  1 tablet Oral Daily  . OLANZapine  2.5 mg Oral Daily  . ondansetron      . pantoprazole  40 mg Oral BID AC  . predniSONE  20 mg Oral BID WC  . sodium chloride flush  3 mL Intravenous Q12H  . thiamine  100 mg Oral Daily  . vitamin B-12  1,000 mcg Oral Daily   Continuous Infusions: . sodium chloride    . lactated ringers 10 mL/hr at 01/21/17 0916   PRN Meds:.sodium chloride, acetaminophen **OR** acetaminophen, alum & mag hydroxide-simeth, bisacodyl, chlorpheniramine-HYDROcodone, diphenhydrAMINE, guaiFENesin-dextromethorphan, ipratropium-albuterol, ondansetron **OR** ondansetron (ZOFRAN) IV,  oxyCODONE, polyethylene glycol, sodium chloride flush  Micro Results Recent Results (from the past 240 hour(s))  Culture, blood (routine x 2)     Status: None (Preliminary result)   Collection Time: 01/17/17 11:29 AM  Result Value Ref Range Status   Specimen Description BLOOD LEFT ANTECUBITAL  Final   Special Requests   Final    BOTTLES DRAWN AEROBIC AND ANAEROBIC Blood Culture results may not be optimal due to an excessive volume of blood received in culture bottles   Culture NO GROWTH 3 DAYS  Final   Report Status PENDING  Incomplete  Culture, blood (routine x 2)     Status: None (Preliminary result)   Collection Time: 01/17/17 11:53 AM  Result Value Ref Range Status   Specimen Description BLOOD LEFT ANTECUBITAL  Final   Special Requests IN PEDIATRIC BOTTLE Blood Culture adequate volume  Final   Culture NO GROWTH 3 DAYS  Final   Report Status PENDING  Incomplete    Radiology Reports US Renal  Result Date: 01/17/2017 CLINICAL DATA:  Elevated creatinine, history hypertension, asthma, COPD, coronary artery disease, alcoholic cirrhosis EXAM: RENAL / URINARY TRACT ULTRASOUND COMPLETE COMPARISON:  CT abdomen and pelvis 01/06/2016, abdomen ultrasound 01/13/2017 FINDINGS: Right Kidney: Length: 10.9 cm. Normal cortical thickness and echogenicity. No mass, hydronephrosis or shadowing calcification. Left Kidney: Length: 11.6 cm. Normal cortical thickness and echogenicity. No mass, hydronephrosis or shadowing calcification. Bladder: Partially distended, unremarkable. IMPRESSION: No sonographic abnormalities of the urinary tract identified. Electronically Signed   By: Lavonia Dana M.D.   On: 01/17/2017 16:51   Dg Chest Port 1 View  Result Date: 01/14/2017 CLINICAL DATA:  Acute onset of shortness of breath and wheezing. Initial encounter. EXAM: PORTABLE CHEST 1 VIEW COMPARISON:  Chest radiograph performed 01/12/2017 FINDINGS: The lungs are well-aerated. Mild bibasilar airspace opacities raise concern  for pneumonia. There is no evidence of pleural effusion or pneumothorax. The cardiomediastinal silhouette is within normal limits. No acute osseous abnormalities are seen. IMPRESSION: Mild bibasilar airspace opacities raise concern for pneumonia. Electronically Signed   By: Garald Balding M.D.   On: 01/14/2017 00:56    Time Spent in minutes  25   Louellen Molder M.D on 01/21/2017 at 3:03 PM  Between 7am to 7pm - Pager - [phone]  After 7pm go to [email or web address] - password [email or web address]  [facility] -  Office  [phone]

## 2017-01-21 NOTE — Clinical Social Work Note (Signed)
Patient leaving AMA. He states that his brother Harrell Gave is picking him up and he will return home with him. His brother has oxygen in the car if he needs it. He uses oxygen prn at home. Patient states he feels safe leaving today.  CSW signing off.  Dayton Scrape, [place]

## 2017-01-21 NOTE — Anesthesia Procedure Notes (Signed)
Procedure Name: Intubation Date/Time: 01/21/2017 11:38 AM Performed by: Gaylene Brooks Pre-anesthesia Checklist: Patient identified, Emergency Drugs available, Suction available and Patient being monitored Patient Re-evaluated:Patient Re-evaluated prior to inductionOxygen Delivery Method: Circle System Utilized Preoxygenation: Pre-oxygenation with 100% oxygen Intubation Type: IV induction Laryngoscope Size: Glidescope and 4 Grade View: Grade I Tube type: Oral Tube size: 8.0 mm Number of attempts: 1 Airway Equipment and Method: Stylet,  Oral airway and Video-laryngoscopy Placement Confirmation: ETT inserted through vocal cords under direct vision,  positive ETCO2 and breath sounds checked- equal and bilateral Secured at: 23 cm Tube secured with: Tape Dental Injury: Teeth and Oropharynx as per pre-operative assessment  Difficulty Due To: Difficulty was anticipated, Difficult Airway- due to reduced neck mobility and Difficult Airway- due to large tongue Comments: Pt with large neck, big tongue, reduced neck mobility. Decided to use glidescope for MRI intubation. Good view.ETT easily placed. AOI.

## 2017-01-21 NOTE — Progress Notes (Signed)
Text page to re adjust pt medication times, pt was at [place]

## 2017-01-21 NOTE — Progress Notes (Signed)
MD returned page. MD talked to the patient via phone. Pt still wants to leave AMA. Educated pt on risk of not having oxygen, pt states he does not always wear it and wants to leave without the oxygen. MD aware, MD stated okay to sign AMA. Pt states "I feel like I'm in a prison and cannot stand another night here" Pt signed form, pt has all belongings Pt states his brother [name] is coming to pick him up and will have oxygen in the car if needed. Pt ambulated off the unit.  Social worker came to bedside to educate pt on discharge safety, pt states he feels safe going home and that "I just need to leave here" Pt IV discontinued, catheter intact, pt telemetry removed   Harold Mattes Leory Plowman

## 2017-01-21 NOTE — Clinical Social Work Note (Signed)
CSW spoke with staff at [employer]. She stated that it is highly unlikely that they made a copy of the patient's birth certificate. They probably just looked at it before providing services. Patient will likely have to request a copy of his birth certificate to be mailed to him if he does not already have a copy at home. Patient currently in MRI. Will update when he returns.  Jeremy Mcgee, [place]

## 2017-01-22 ENCOUNTER — Encounter (HOSPITAL_COMMUNITY): Payer: Self-pay | Admitting: Radiology

## 2017-01-22 LAB — CULTURE, BLOOD (ROUTINE X 2)
CULTURE: NO GROWTH
Culture: NO GROWTH
Special Requests: ADEQUATE

## 2017-01-22 NOTE — Discharge Summary (Signed)
Physician Discharge Summary  Jeremy Mcgee SNK:[id number] DOB: [birth year] DOA: 01/13/2017  PCP: Patient, No Pcp Per  Admit date: 01/13/2017 Discharge date: 01/22/2017  Admitted From: [facility] Disposition:  Home, left AMA  Recommendations for Outpatient Follow-up:  1. Follow up with PCP in 1-2 weeks 2. Patient left AGAINST MEDICAL ADVICE before I could have further discussions with him up for right any necessary prescriptions.  No pain medications or any prescriptions were given to the patient.  Home Health: None Equipment/Devices: Chronic Oxygen (2L Nasal cannula)  Discharge Condition: Guarded CODE STATUS: Full code Diet recommendation: Heart Healthy / Carb Modified     Discharge Diagnoses:  Principal Problem:   COPD with acute exacerbation ([place])   Active Problems:  Acute on chronic respiratory failure with hypoxia  Lumbar radiculopathy   Bilateral Lower extremity weakness   CAD (coronary artery disease)   Tobacco abuse   Alcoholic cirrhosis of liver without ascites (HCC)   Chronic back pain   Clonus   COPD with acute exacerbation (HCC)   Essential hypertension   Hyperglycemia   Pneumonia  Brief narrative/history of present illness  51 year old WMPMHx Morbidly Obese COPD with Chronic Hypoxic respiratory failure(2-3 L oxygen at home.),EtOH liver cirrhosis without ascites (no active alcohol use), CAD, HTN, Chronic Low back pain with radiculopathy, Bilateral knee pain   [facility] from [facility] where he was admitted on 6/17 with acute exacerbation of COPD and worsening chronic back pain with recurrent falls over the past few months due to bilateral lower extremity weakness.  Patient reports chronic low back pain for many years and was following with Dr. Pascal Lux who is an orthopedic surgeon in [place]. He had MRI of the lumbar spine done in early 2017 showing lateral recess narrowing of left L2-L3 and left foraminal stenosis of L4-L5. He has been treated  with pain medications and multiple steroid injections. He also follows with pain management and is being treated with long-acting morphine and oxycodone IR. He informs his lower extremity weakness to be bilateral and present for past 3-4 months. He has experienced buckling of his knees causing multiple falls. No head injury or loss of consciousness. He informs the pain radiates down to his legs but denies any bowel or urinary incontinence/retention. Patient was last seen by his orthopedics in March when he received steroid injection to the knees. He was planned to have MRI of his back and knees but due to severe claustrophobia this was not possible.  Patient was then admitted to [facility] for acute exacerbation of COPD and ongoing severe low back pain with bilateral leg weakness. Blood work done showed leukocytosis. B12 was normal. Patient was being treated with Solu-Medrol and nebs along with empiric antibiotics. Patient was unable to tolerate MRI of his low back due to severe claustrophobia. Since he would need an open MRI or done under sedation followed by possible neurosurgical consultation he was transferred to [facility] course Principal Problem:   COPD With acute exacerbation [facility]) Has chronic hypoxic respiratory failure with centrilobular emphysema. Currently maintaining sats on 2 L via nasal cannula. Transition to oral prednisone Completed 5 day course of antibiotic.  when necessary nebs and antitussives. -Symptoms have improved to baseline.  Active Problems: Chronic low back pain with bilateral lower extremity weakness. Suspect lumbar radiculopathy. MRI done outside in January 2017 shows lateral recess narrowing of L2-3, and left foraminal stenosis of L4-5.Patient having multiple falls at home and found to be very weak on PT evaluation. No signs  of cord compression clinically. MRI of the lumbar spine under conscious sedation on 6/26 shows degenerative disc bulge with  tiny disc extrusion at L2- L5, causing mild lateral recess stenosis.  Findings doesnot seem to have changed from prior MRI result in feb 2017. Patient currently on MS Contin twice a day and oxycodone IR every 6 hours. (This is his home regimen). Dilaudid discontinued due to metabolic encephalopathy. Patient follows chronically in pain clinic.   Upon returning from the MRI I discussed the findings with the patient and told him that I will be consulting neurosurgery to evaluate the imaging and provide any necessary recommendations. I also told him that the MRI results did not appear to have changed compared to his last MRI 18 months back. Patient didn't appear that he was unhappy with some of the care he received in the hospital (for example being woken up early in the morning, staffs are turning the lights of his home off on leaving and said that he felt like he was in a prison and could not stay any longer.)  I extend to him that his symptoms were getting better and the neurosurgeon felt that there was no surgical intervention or procedure needed for his back he would be discharged in the morning to SNF for ongoing physical therapy and should follow-up with his orthopedic surgeon as outpatient.  He informed that his brother Jeremy Mcgee was coming to pick him up and also informed that he will have oxygen in the car if needed. Patient ambulated off the unit by himself. I had spoken with him on the phone but patient had already left the building before I could go and meet with him in person or give him any prescription medications.  Both his nurse and social worker met with him and discussed his safety in going home. Patient clearly understood the risk to his health including worsening respiratory failure, worsened back pain, rehospitalization and death.   Acute kidney injury Suspect prerenal with dehydration versus ATN due to NSAID use. Discontinued ACE inhibitor and renal function improved with gentle  hydration. Counseled strongly on smoking cessation.  Acute metabolic encephalopathy Suspected due to narcotic and benzodiazepine use.  Dose adjusted and now resolved.    CAD (coronary artery disease)   Tobacco abuse    Alcoholic cirrhosis of liver without ascites ([place]) Counseled strongly on alcohol cessation. Has chronic transaminitis.    hyperglycemia with prediabetes A1c of 5.7. Monitored on sliding scale coverage.  Essential hypertension Continue beta blocker. Lisinopril was discontinued due to history of present illness  Morbid obesity with ?OHS Needs outpatient sleep study.   Family Communication  : None at bedside  Disposition Plan  :  left AMA   Consults  :  None  Procedures  :  MRI of the lumbar spine   Discharge Instructions   Allergies as of 01/21/2017      Reactions   Bee Venom Anaphylaxis, Hives   Penicillins Hives    Stop taking isinopril 20 MG tablet Commonly known as:  PRINIVIL,ZESTRIL Take 1 tablet (20 mg total) by mouth daily.    Medication List    ASK your doctor about these medications   albuterol (2.5 MG/3ML) 0.083% nebulizer solution Commonly known as:  PROVENTIL Take 2.5 mg by nebulization every 6 (six) hours as needed for wheezing or shortness of breath.   albuterol 108 (90 Base) MCG/ACT inhaler Commonly known as:  PROVENTIL HFA;VENTOLIN HFA Inhale 2 puffs into the lungs every 6 (six) hours as  needed for wheezing or shortness of breath.   aspirin EC 81 MG tablet Take 81 mg by mouth daily.      atenolol 50 MG tablet Commonly known as:  TENORMIN Take 75 mg by mouth daily.   budesonide-formoterol 160-4.5 MCG/ACT inhaler Commonly known as:  SYMBICORT Inhale 2 puffs into the lungs 2 (two) times daily.   clonazePAM 0.5 MG tablet Commonly known as:  KLONOPIN Take 0.5 mg by mouth 3 (three) times daily as needed for anxiety.   cyclobenzaprine 10 MG tablet Commonly known as:  FLEXERIL Take 10 mg by mouth 3 (three)  times daily as needed for muscle spasms.   diazepam 5 MG tablet Commonly known as:  VALIUM Take 10 mg by mouth 3 (three) times daily.   diphenhydrAMINE 25 MG tablet Commonly known as:  BENADRYL Take 2 tablets (50 mg total) by mouth every 4 (four) hours as needed for itching.   EPINEPHrine 0.3 mg/0.3 mL Soaj injection Commonly known as:  EPIPEN 2-PAK Inject 0.3 mLs (0.3 mg total) into the muscle once.   gabapentin 400 MG capsule Commonly known as:  NEURONTIN Take 1,200 mg by mouth at bedtime.   hydrOXYzine 25 MG tablet Commonly known as:  ATARAX/VISTARIL Take 25 mg by mouth 4 (four) times daily.   l   Melatonin 5 MG Tabs Take 5 mg by mouth at bedtime.   morphine 30 MG 12 hr tablet Commonly known as:  MS CONTIN Take 1 tablet (30 mg total) by mouth every 12 (twelve) hours.   naproxen 250 MG tablet Commonly known as:  NAPROSYN Take 1 tablet (250 mg total) by mouth 2 (two) times daily with a meal.   omeprazole 40 MG capsule Commonly known as:  PRILOSEC Take 40 mg by mouth 2 (two) times daily.   oxyCODONE 5 MG immediate release tablet Commonly known as:  Oxy IR/ROXICODONE Take 1 tablet (5 mg total) by mouth every 6 (six) hours as needed for severe pain.   oxyCODONE-acetaminophen 10-325 MG tablet Commonly known as:  PERCOCET Take 1 tablet by mouth every 6 (six) hours as needed for pain.   pantoprazole 40 MG tablet Commonly known as:  PROTONIX Take 1 tablet (40 mg total) by mouth 2 (two) times daily before a meal.   predniSONE 20 MG tablet Commonly known as:  DELTASONE Take 1 tablet (20 mg total) by mouth 2 (two) times daily with a meal.      Contact information for after-discharge care    Destination    [facility] .   Specialty:  Skilled Nursing Facility Contact information: [place] [address] [phone]             Allergies  Allergen Reactions  . Bee Venom Anaphylaxis and Hives  . Penicillins Hives       Procedures/Studies: Mr Lumbar Spine W Wo Contrast  Result Date: 01/21/2017 CLINICAL DATA:  Initial evaluation for chronic back pain with recurrent falls, lower extremity weakness. EXAM: MRI LUMBAR SPINE WITHOUT AND WITH CONTRAST TECHNIQUE: Multiplanar and multiecho pulse sequences of the lumbar spine were obtained without and with intravenous contrast. CONTRAST:  70m MULTIHANCE GADOBENATE DIMEGLUMINE 529 MG/ML IV SOLN COMPARISON:  Prior radiograph from 01/20/2005. FINDINGS: Segmentation: Normal segmentation. Lowest well-formed disc is labeled the L5-S1 level. Alignment: Vertebral bodies normally aligned with preservation of the normal lumbar lordosis. No listhesis. Vertebrae: Vertebral body heights are maintained. No evidence for acute or chronic fracture. Chronic reactive endplate changes present about  the L2-3 interspace. Signal intensity within the vertebral body bone marrow is normal. No discrete or worrisome osseous lesions. No abnormal marrow edema. No abnormal enhancement. Conus medullaris: Extends to the L1 level and appears normal. Paraspinal and other soft tissues: Paraspinous soft tissues within normal limits. 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 Visualized visceral structures otherwise unremarkable. Disc levels: L1-2: No significant disc bulge. Small chronic endplate Schmorl's node deformities. No significant facet disease. No canal or foraminal stenosis. No neural impingement. L2-3: Chronic diffuse degenerative disc bulge with disc desiccation and reactive endplate changes. Disc bulging slightly eccentric to the left. Superimposed tiny left subarticular disc extrusion with inferior migration (series 7, image 15). Associated peridiscal enhancement (series 9, image 11). Resultant mild left lateral recess stenosis. No significant canal narrowing. No significant foraminal encroachment. No neural impingement. L3-4: Normal interspace. No  significant facet arthrosis. No canal or foraminal stenosis. No neural impingement. L4-5: Tiny left foraminal disc protrusion with associated annular fissure (series 3, image 13). No significant canal stenosis. Mild left foraminal narrowing. No significant right foraminal encroachment. L5-S1:  Unremarkable. IMPRESSION: 1. Degenerative disc bulge with tiny left subarticular disc extrusion at L2-3 with resultant mild left lateral recess stenosis. 2. Tiny left foraminal disc protrusion at L4-5 with resultant mild left foraminal stenosis. 3. No other significant degenerative changes within the lumbar spine. Electronically Signed   By: Jeannine Boga M.D.   On: 01/21/2017 15:16   US Renal  Result Date: 01/17/2017 CLINICAL DATA:  Elevated creatinine, history hypertension, asthma, COPD, coronary artery disease, alcoholic cirrhosis EXAM: RENAL / URINARY TRACT ULTRASOUND COMPLETE COMPARISON:  CT abdomen and pelvis 01/06/2016, abdomen ultrasound 01/13/2017 FINDINGS: Right Kidney: Length: 10.9 cm. Normal cortical thickness and echogenicity. No mass, hydronephrosis or shadowing calcification. Left Kidney: Length: 11.6 cm. Normal cortical thickness and echogenicity. No mass, hydronephrosis or shadowing calcification. Bladder: Partially distended, unremarkable. IMPRESSION: No sonographic abnormalities of the urinary tract identified. Electronically Signed   By: Lavonia Dana M.D.   On: 01/17/2017 16:51   Dg Chest Port 1 View  Result Date: 01/14/2017 CLINICAL DATA:  Acute onset of shortness of breath and wheezing. Initial encounter. EXAM: PORTABLE CHEST 1 VIEW COMPARISON:  Chest radiograph performed 01/12/2017 FINDINGS: The lungs are well-aerated. Mild bibasilar airspace opacities raise concern for pneumonia. There is no evidence of pleural effusion or pneumothorax. The cardiomediastinal silhouette is within normal limits. No acute osseous abnormalities are seen. IMPRESSION: Mild bibasilar airspace opacities raise  concern for pneumonia. Electronically Signed   By: Garald Balding M.D.   On: 01/14/2017 00:56       Subjective: Patient reported his breathing to be better. Discussed MRI findings at bedside.  Discharge Exam: Vitals:   01/21/17 1400 01/21/17 1422  BP: (!) 168/100 (!) 170/59  Pulse: (!) 107 79  Resp: (!) 22 18  Temp: 97.8 F (36.6 C) 99.5 F (37.5 C)   Vitals:   01/21/17 1326 01/21/17 1345 01/21/17 1400 01/21/17 1422  BP: (!) 156/99 (!) 160/95 (!) 168/100 (!) 170/59  Pulse: (!) 109 100 (!) 107 79  Resp: 16 18 (!) 22 18  Temp: 97.7 F (36.5 C)  97.8 F (36.6 C) 99.5 F (37.5 C)  TempSrc:    Oral  SpO2: 98% 95% 94% 95%  Weight:      Height:        Gen: NAD  HEENT: moist mucosa, supple neck Chest : improved breath sounds b/l CVS: NS1&S2  GI:  soft, NT, ND Musculoskeletal: warm, no edema CNS; alert and oriented , non focal     The results of significant diagnostics from this hospitalization (including imaging, microbiology, ancillary and laboratory) are listed below for reference.     Microbiology: Recent Results (from the past 240 hour(s))  Culture, blood (routine x 2)     Status: None   Collection Time: 01/17/17 11:29 AM  Result Value Ref Range Status   Specimen Description BLOOD LEFT ANTECUBITAL  Final   Special Requests   Final    BOTTLES DRAWN AEROBIC AND ANAEROBIC Blood Culture results may not be optimal due to an excessive volume of blood received in culture bottles   Culture NO GROWTH 5 DAYS  Final   Report Status 01/22/2017 FINAL  Final  Culture, blood (routine x 2)     Status: None   Collection Time: 01/17/17 11:53 AM  Result Value Ref Range Status   Specimen Description BLOOD LEFT ANTECUBITAL  Final   Special Requests IN PEDIATRIC BOTTLE Blood Culture adequate volume  Final   Culture NO GROWTH 5 DAYS  Final   Report Status 01/22/2017 FINAL  Final     Labs: BNP (last 3 results) No results for input(s): BNP in the last 8760 hours. Basic  Metabolic Panel:  Recent Labs Lab 01/16/17 0259 01/17/17 0347 01/18/17 0452 01/19/17 0325 01/20/17 0413  NA 134* 132* 136 138 142  K 5.0 5.3* 4.9 4.7 4.4  CL 99* 101 104 105 101  CO2 23 19* 21* 25 34*  GLUCOSE 99 109* 127* 133* 93  BUN 48* 75* 63* 43* 32*  CREATININE 2.26* 2.44* 1.53* 1.25* 1.16  CALCIUM 9.3 8.7* 9.0 9.2 9.3  MG  --   --   --   --  2.2   Liver Function Tests:  Recent Labs Lab 01/16/17 0259 01/17/17 0347 01/18/17 0452 01/19/17 0325  AST 76* 69* 66* 79*  ALT 105* 100* 95* 113*  ALKPHOS 55 53 62 50  BILITOT 0.6 0.7 0.8 0.8  PROT 6.8 6.3* 6.3* 6.0*  ALBUMIN 3.9 3.7 3.5 3.3*   No results for input(s): LIPASE, AMYLASE in the last 168 hours.  Recent Labs Lab 01/17/17 1153 01/20/17 0413  AMMONIA 29 12   CBC:  Recent Labs Lab 01/16/17 0259 01/17/17 0347 01/18/17 0452 01/19/17 0325 01/20/17 0413  WBC 24.3* 22.4* 19.3* 20.6* 22.6*  NEUTROABS  --   --   --  17.1*  --   HGB 14.4 14.0 14.4 14.9 14.7  HCT 46.8 43.5 42.3 44.8 45.3  MCV 104.0* 101.2* 98.6 100.0 100.2*  PLT 245 220 220 216 238   Cardiac Enzymes: No results for input(s): CKTOTAL, CKMB, CKMBINDEX, TROPONINI in the last 168 hours. BNP: Invalid input(s): POCBNP CBG:  Recent Labs Lab 01/20/17 1143 01/20/17 1740 01/20/17 2158 01/21/17 0739 01/21/17 0942  GLUCAP 112* 137* 168* 87 94   D-Dimer No results for input(s): DDIMER in the last 72 hours. Hgb A1c No results for input(s): HGBA1C in the last 72 hours. Lipid Profile No results for input(s): CHOL, HDL, LDLCALC, TRIG, CHOLHDL, LDLDIRECT in the last 72 hours. Thyroid function studies No results for input(s): TSH, T4TOTAL, T3FREE, THYROIDAB in the last 72 hours.  Invalid input(s): FREET3 Anemia work up No results for input(s): VITAMINB12, FOLATE, FERRITIN, TIBC, IRON, RETICCTPCT in the last 72 hours. Urinalysis    Component Value Date/Time   COLORURINE YELLOW 01/16/2017 1116   APPEARANCEUR CLEAR 01/16/2017 1116    LABSPEC 1.014 01/16/2017 1116  PHURINE 5.0 01/16/2017 1116   GLUCOSEU NEGATIVE 01/16/2017 1116   [place] 01/16/2017 1116   [place] 01/16/2017 1116   [place] 01/16/2017 1116   PROTEINUR NEGATIVE 01/16/2017 1116   UROBILINOGEN 0.2 04/15/2014 1738   NITRITE NEGATIVE 01/16/2017 1116   LEUKOCYTESUR NEGATIVE 01/16/2017 1116   Sepsis Labs Invalid input(s): PROCALCITONIN,  WBC,  LACTICIDVEN Microbiology Recent Results (from the past 240 hour(s))  Culture, blood (routine x 2)     Status: None   Collection Time: 01/17/17 11:29 AM  Result Value Ref Range Status   Specimen Description BLOOD LEFT ANTECUBITAL  Final   Special Requests   Final    BOTTLES DRAWN AEROBIC AND ANAEROBIC Blood Culture results may not be optimal due to an excessive volume of blood received in culture bottles   Culture NO GROWTH 5 DAYS  Final   Report Status 01/22/2017 FINAL  Final  Culture, blood (routine x 2)     Status: None   Collection Time: 01/17/17 11:53 AM  Result Value Ref Range Status   Specimen Description BLOOD LEFT ANTECUBITAL  Final   Special Requests IN PEDIATRIC BOTTLE Blood Culture adequate volume  Final   Culture NO GROWTH 5 DAYS  Final   Report Status 01/22/2017 FINAL  Final     Time coordinating discharge: Over 30 minutes  SIGNED:   Louellen Molder, MD  [facility] 01/22/2017, 5:42 PM Pager   If 7PM-7AM, please contact night-coverage [email or web address] Password [email or web address]

## 2017-01-22 NOTE — Anesthesia Postprocedure Evaluation (Signed)
Anesthesia Post Note  [name]  Procedure(s) Performed: Procedure(s) (LRB): LUMBER SPINE WITH AND WITHOUT CONTRAST (N/A)     Patient location during evaluation: PACU Anesthesia Type: General Level of consciousness: awake, awake and alert and oriented Pain management: pain level controlled Vital Signs Assessment: post-procedure vital signs reviewed and stable Respiratory status: spontaneous breathing, nonlabored ventilation and respiratory function stable Cardiovascular status: blood pressure returned to baseline Anesthetic complications: no    Last Vitals:  Vitals:   01/21/17 1400 01/21/17 1422  BP: (!) 168/100 (!) 170/59  Pulse: (!) 107 79  Resp: (!) 22 18  Temp: 36.6 C 37.5 C    Last Pain:  Vitals:   01/21/17 1526  TempSrc:   PainSc: Asleep                 Jeremy Mcgee COKER

## 2017-01-23 LAB — VITAMIN B1: Vitamin B1 (Thiamine): 138.8 nmol/L (ref 66.5–200.0)

## 2017-09-10 NOTE — Progress Notes (Signed)
REVIEWED-NO ADDITIONAL RECOMMENDATIONS. 

## 2018-01-25 IMAGING — MR MR LUMBAR SPINE WO/W CM
4 of 7 series · 20 of 48 positions shown · IV contrast (multihance)
Comparison: Prior radiograph from 01/20/2005.

CLINICAL DATA: Initial evaluation for chronic back pain with
recurrent falls, lower extremity weakness.

EXAM:
MRI LUMBAR SPINE WITHOUT AND WITH CONTRAST
TECHNIQUE: Multiplanar and multiecho pulse sequences of the lumbar spine were
obtained without and with intravenous contrast.
CONTRAST:  20mL MULTIHANCE GADOBENATE DIMEGLUMINE 529 MG/ML IV SOLN

[Series 3: T2 · sagittal · 4.5mm · 0.59mm/px · 5 of 16 slices shown (1 of 2)]
[im 1/16]
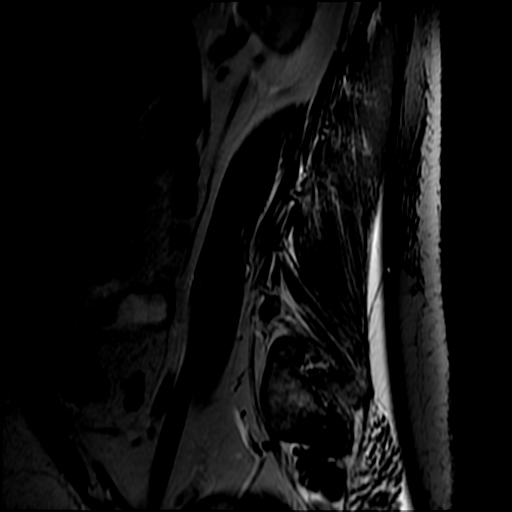
[im 4/16]
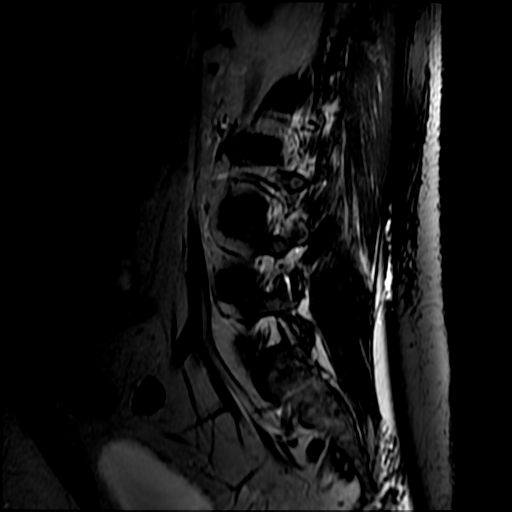
[im 8/16]
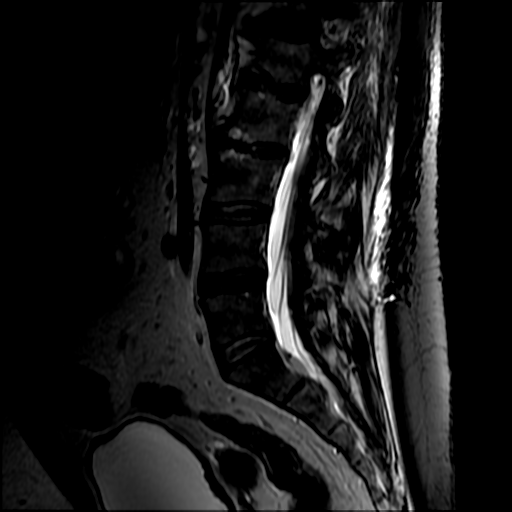
[im 12/16]
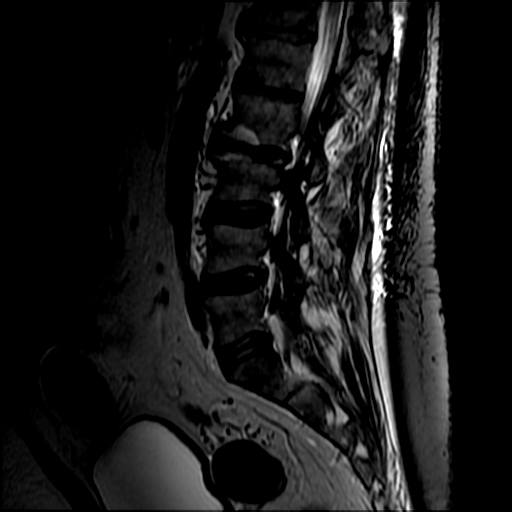
[im 16/16]
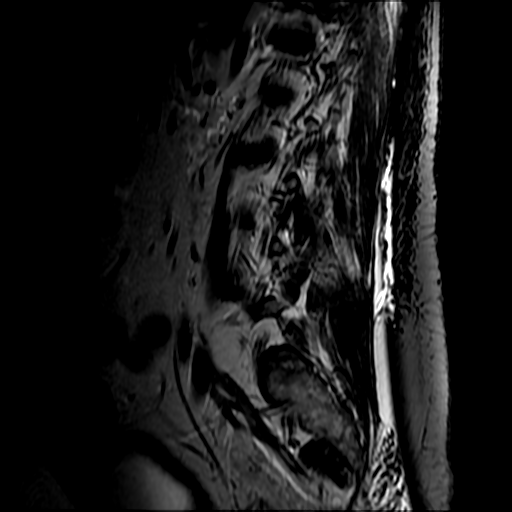

[Series 4: T1 · sagittal · 4.5mm · 0.62mm/px · 4 of 16 slices shown (1 of 2)]
[im 1/16]
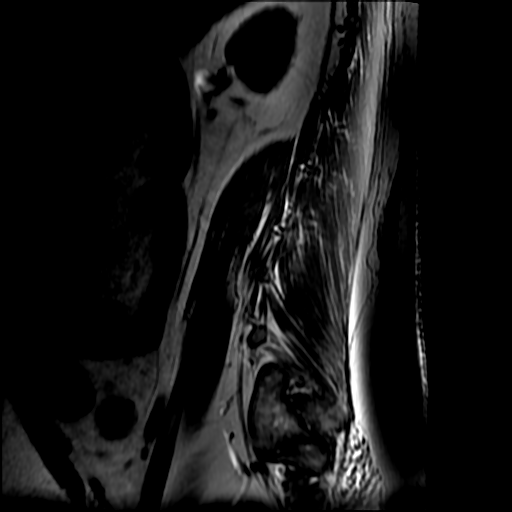
[im 4/16]
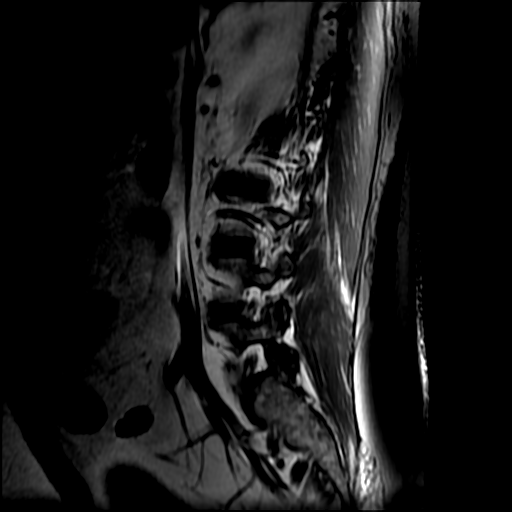
[im 8/16]
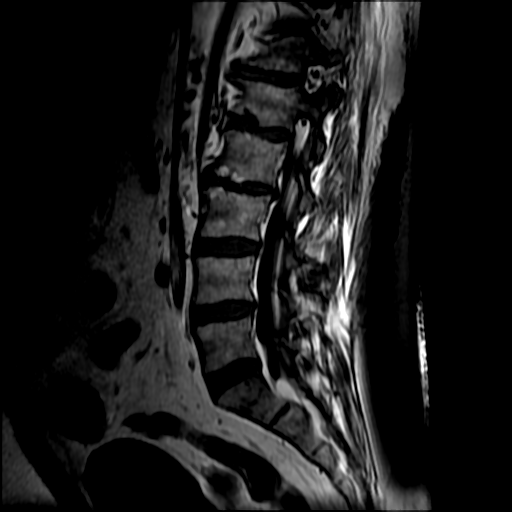
[im 16/16]
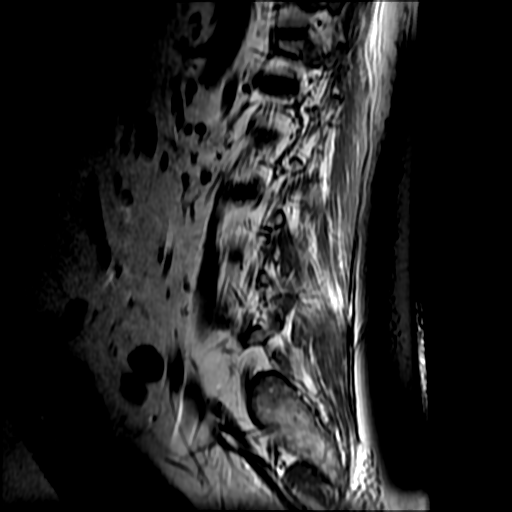

[Series 6: T2 · axial · 4.0mm · 0.43mm/px · z∈[+5,+216]mm · 8 of 38 slices shown (2 of 2)]
[im 1/38]
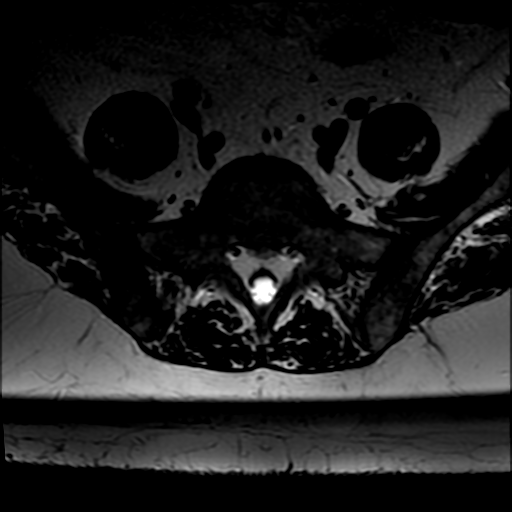
[im 5/38]
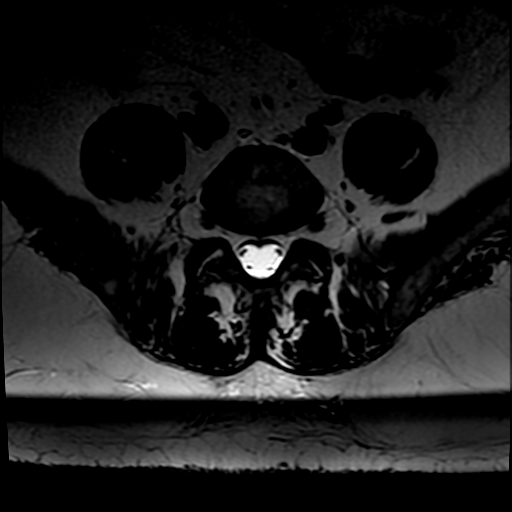
[im 13/38]
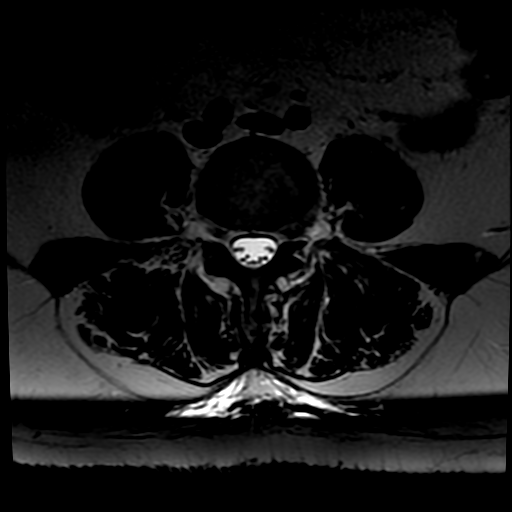
[im 17/38]
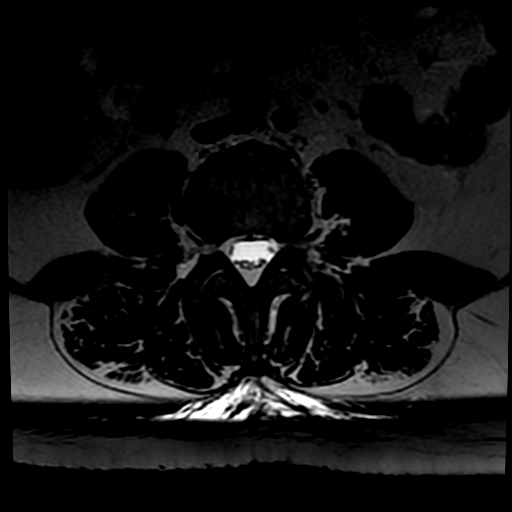
[im 21/38]
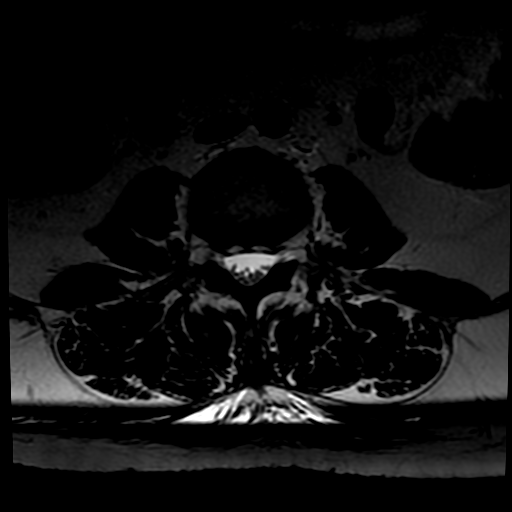
[im 25/38]
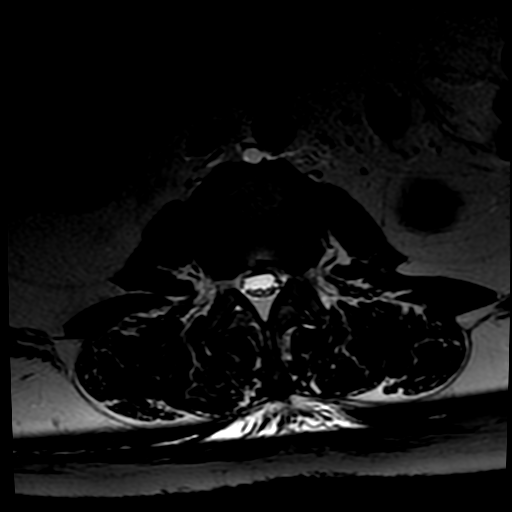
[im 33/38]
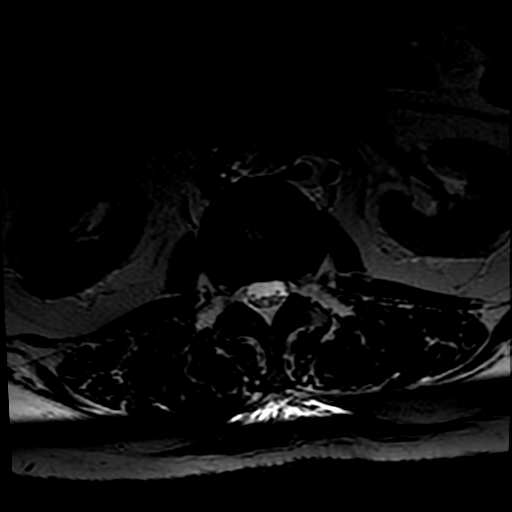
[im 38/38]
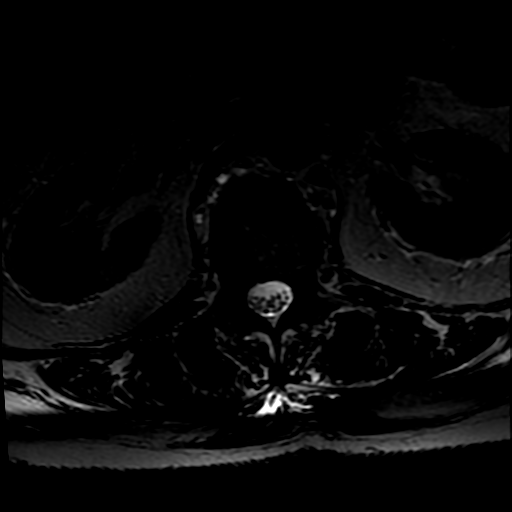

[Series 7: T1 · axial · 4.0mm · 0.86mm/px · z∈[+25,+191]mm · 3 of 38 slices shown (2 of 2)]
[im 5/38]
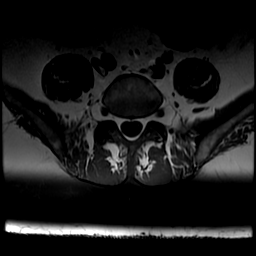
[im 21/38]
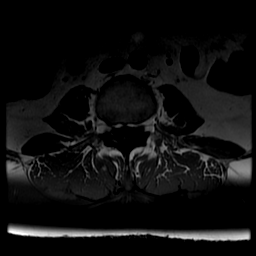
[im 33/38]
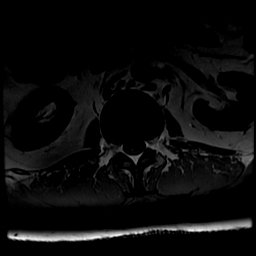

[20 of 48 positions shown; findings below may reference images not displayed]

FINDINGS: Segmentation: Normal segmentation. Lowest well-formed disc is
labeled the L5-S1 level.

Alignment: Vertebral bodies normally aligned with preservation of
the normal lumbar lordosis. No listhesis.

Vertebrae: Vertebral body heights are maintained. No evidence for
acute or chronic fracture. Chronic reactive endplate changes present
about the L2-3 interspace. Signal intensity within the vertebral
body bone marrow is normal. No discrete or worrisome osseous
lesions. No abnormal marrow edema. No abnormal enhancement.

Conus medullaris: Extends to the L1 level and appears normal.

Paraspinal and other soft tissues: Paraspinous soft tissues within
normal limits. Soft tissue lesion posterior to the bladder likely
related to splenosis as seen on prior CT. Visualized visceral
structures otherwise unremarkable.

Disc levels:

L1-2: No significant disc bulge. Small chronic endplate Schmorl's
node deformities. No significant facet disease. No canal or
foraminal stenosis. No neural impingement.

L2-3: Chronic diffuse degenerative disc bulge with disc desiccation
and reactive endplate changes. Disc bulging slightly eccentric to
the left. Superimposed tiny left subarticular disc extrusion with
inferior migration (series 7, image 15). Associated peridiscal
enhancement (series 9, image 11). Resultant mild left lateral recess
stenosis. No significant canal narrowing. No significant foraminal
encroachment. No neural impingement.

L3-4: Normal interspace. No significant facet arthrosis. No canal or
foraminal stenosis. No neural impingement.

L4-5: Tiny left foraminal disc protrusion with associated annular
fissure (series 3, image 13). No significant canal stenosis. Mild
left foraminal narrowing. No significant right foraminal
encroachment.

L5-S1:  Unremarkable.
IMPRESSION: 1. Degenerative disc bulge with tiny left subarticular disc
extrusion at L2-3 with resultant mild left lateral recess stenosis.
2. Tiny left foraminal disc protrusion at L4-5 with resultant mild
left foraminal stenosis.
3. No other significant degenerative changes within the lumbar
spine.

## 2021-12-27 DEATH — deceased
